# Patient Record
Sex: Female | Born: 1990 | Race: White | Hispanic: No | Marital: Single | Smoking: Never smoker
Health system: Southern US, Community
[De-identification: ages and names within clinical notes are randomized; demographics above are authoritative.]

## PROBLEM LIST (undated history)

## (undated) DIAGNOSIS — F329 Major depressive disorder, single episode, unspecified: Secondary | ICD-10-CM

## (undated) DIAGNOSIS — C7951 Secondary malignant neoplasm of bone: Secondary | ICD-10-CM

## (undated) DIAGNOSIS — Z803 Family history of malignant neoplasm of breast: Secondary | ICD-10-CM

## (undated) DIAGNOSIS — Z853 Personal history of malignant neoplasm of breast: Secondary | ICD-10-CM

## (undated) DIAGNOSIS — Z9221 Personal history of antineoplastic chemotherapy: Secondary | ICD-10-CM

## (undated) DIAGNOSIS — F32A Depression, unspecified: Secondary | ICD-10-CM

## (undated) DIAGNOSIS — Z1509 Genetic susceptibility to other malignant neoplasm: Secondary | ICD-10-CM

## (undated) DIAGNOSIS — Z95828 Presence of other vascular implants and grafts: Secondary | ICD-10-CM

## (undated) DIAGNOSIS — C50919 Malignant neoplasm of unspecified site of unspecified female breast: Secondary | ICD-10-CM

## (undated) DIAGNOSIS — Z1501 Genetic susceptibility to malignant neoplasm of breast: Secondary | ICD-10-CM

## (undated) HISTORY — DX: Malignant neoplasm of unspecified site of unspecified female breast: C50.919

## (undated) HISTORY — DX: Genetic susceptibility to malignant neoplasm of breast: Z15.01

## (undated) HISTORY — DX: Family history of malignant neoplasm of breast: Z80.3

## (undated) HISTORY — DX: Genetic susceptibility to other malignant neoplasm: Z15.09

## (undated) HISTORY — DX: Secondary malignant neoplasm of bone: C79.51

---

## 2012-12-02 DIAGNOSIS — O98119 Syphilis complicating pregnancy, unspecified trimester: Secondary | ICD-10-CM | POA: Insufficient documentation

## 2017-05-26 DIAGNOSIS — C50919 Malignant neoplasm of unspecified site of unspecified female breast: Secondary | ICD-10-CM

## 2017-05-26 HISTORY — DX: Malignant neoplasm of unspecified site of unspecified female breast: C50.919

## 2017-05-27 ENCOUNTER — Other Ambulatory Visit: Payer: Self-pay | Admitting: Physician Assistant

## 2017-05-27 DIAGNOSIS — C50911 Malignant neoplasm of unspecified site of right female breast: Secondary | ICD-10-CM

## 2017-05-28 ENCOUNTER — Other Ambulatory Visit: Payer: Self-pay | Admitting: Physician Assistant

## 2017-05-29 ENCOUNTER — Other Ambulatory Visit: Payer: Self-pay

## 2017-06-08 ENCOUNTER — Encounter: Payer: Self-pay | Admitting: Hematology & Oncology

## 2017-06-10 ENCOUNTER — Other Ambulatory Visit: Payer: Self-pay | Admitting: Family

## 2017-06-10 ENCOUNTER — Inpatient Hospital Stay: Payer: Self-pay

## 2017-06-10 ENCOUNTER — Other Ambulatory Visit: Payer: Self-pay

## 2017-06-10 ENCOUNTER — Inpatient Hospital Stay: Payer: Self-pay | Attending: Hematology & Oncology | Admitting: Hematology & Oncology

## 2017-06-10 DIAGNOSIS — Z5111 Encounter for antineoplastic chemotherapy: Secondary | ICD-10-CM | POA: Insufficient documentation

## 2017-06-10 DIAGNOSIS — C50911 Malignant neoplasm of unspecified site of right female breast: Secondary | ICD-10-CM | POA: Insufficient documentation

## 2017-06-10 DIAGNOSIS — Z7189 Other specified counseling: Secondary | ICD-10-CM

## 2017-06-10 DIAGNOSIS — Z5189 Encounter for other specified aftercare: Secondary | ICD-10-CM | POA: Insufficient documentation

## 2017-06-10 DIAGNOSIS — Z171 Estrogen receptor negative status [ER-]: Secondary | ICD-10-CM | POA: Insufficient documentation

## 2017-06-10 DIAGNOSIS — R922 Inconclusive mammogram: Secondary | ICD-10-CM | POA: Insufficient documentation

## 2017-06-10 DIAGNOSIS — Z5112 Encounter for antineoplastic immunotherapy: Secondary | ICD-10-CM | POA: Insufficient documentation

## 2017-06-10 DIAGNOSIS — C773 Secondary and unspecified malignant neoplasm of axilla and upper limb lymph nodes: Secondary | ICD-10-CM | POA: Insufficient documentation

## 2017-06-10 DIAGNOSIS — Z803 Family history of malignant neoplasm of breast: Secondary | ICD-10-CM | POA: Insufficient documentation

## 2017-06-10 LAB — CMP (CANCER CENTER ONLY)
ALBUMIN: 3.8 g/dL (ref 3.5–5.0)
ALK PHOS: 62 U/L (ref 26–84)
ALT: 14 U/L (ref 0–55)
AST: 17 U/L (ref 5–34)
Anion gap: 12 (ref 5–15)
BILIRUBIN TOTAL: 0.5 mg/dL (ref 0.2–1.2)
BUN: 15 mg/dL (ref 7–22)
CHLORIDE: 104 mmol/L (ref 98–108)
CO2: 26 mmol/L (ref 18–33)
CREATININE: 1 mg/dL (ref 0.60–1.10)
Calcium: 9.8 mg/dL (ref 8.0–10.3)
Glucose, Bld: 92 mg/dL (ref 73–118)
Potassium: 4.1 mmol/L (ref 3.5–5.1)
SODIUM: 142 mmol/L (ref 128–145)
Total Protein: 7.9 g/dL (ref 6.4–8.1)

## 2017-06-10 LAB — CBC WITH DIFFERENTIAL (CANCER CENTER ONLY)
Basophils Absolute: 0 10*3/uL (ref 0.0–0.1)
Basophils Relative: 0 %
Eosinophils Absolute: 0.4 10*3/uL (ref 0.0–0.5)
Eosinophils Relative: 4 %
HCT: 39.1 % (ref 34.8–46.6)
HEMOGLOBIN: 12.5 g/dL (ref 11.6–15.9)
LYMPHS ABS: 2.1 10*3/uL (ref 0.9–3.3)
LYMPHS PCT: 22 %
MCH: 28.9 pg (ref 26.0–34.0)
MCHC: 32 g/dL (ref 32.0–36.0)
MCV: 90.5 fL (ref 81.0–101.0)
Monocytes Absolute: 0.6 10*3/uL (ref 0.1–0.9)
Monocytes Relative: 7 %
NEUTROS PCT: 67 %
Neutro Abs: 6.2 10*3/uL (ref 1.5–6.5)
Platelet Count: 230 10*3/uL (ref 145–400)
RBC: 4.32 MIL/uL (ref 3.70–5.32)
RDW: 13.9 % (ref 11.1–15.7)
WBC: 9.2 10*3/uL (ref 3.9–10.3)

## 2017-06-10 NOTE — Progress Notes (Signed)
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 New Patient (Initial Visit)  : I found a lump in my right breast.  HPI: Sophia [name] is an incredibly nice 27 year old premenopausal white female.  She really has not been on oral contraceptives.  She has 2 children.  She started her monthly cycles at the age of 66.  She is working.  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  She lives up in [place].  I actually take care of her grandmother who has metastatic breast [name].  Her mom comes in with her.  I know her mom as well as her mom usually accompanies Sophia [name] grandmother to her appointments.  Sophia [name] noted a lump in the right breast is a while ago.  She has not sure how long ago this really was.  It was not tender.  It did not change with her monthly cycle.  There was no weight loss.  She had no cough.  She had no nausea or vomiting.  There is no change in bowel or bladder habits.  She had no arm pain or swelling.    She underwent a mammogram.  This is ordered by her family doctor, Dr. Gar Ponto.  The mammogram was high suspicious for a right breast mass measuring up to 3.7 cm.  There are smaller indeterminate masses.  She had suspicious right axillary adenopathy.  There was nothing noted in the left breast that was suspicious.  She subsequently underwent a biopsy.  She had multiple biopsies.  The biopsy was done on 05/20/2017.  The pathology report ([id number]) showed invasive ductal carcinoma involving all biopsies.  She had a biopsy of a 10:00 mass.  She had a biopsy of a 1:00 mass.  She had a biopsy of a right axillary lymph node.  Again all biopsies showed somewhat high-grade invasive ductal carcinoma.  Prognostic markers were done.  The tumor was ER negative, PR negative, and HER-2 positive.  There was a very high  proliferation marker of 85%.  She was kindly referred to the [facility] for evaluation.  She looks great.  She feels all right.  She has had no bony pain.  She has had no nausea or vomiting.  She does not smoke.  She does not drink.  There is no family history of breast [name] outside of her maternal grandmother.  She clearly will need genetic testing.  Currently, her performance status is ECOG 0.  No past medical history on file.:  :  No current outpatient medications on file.:  :  Not on File:  No family history on file.:  Social History   Socioeconomic History  . Marital status: Single    Spouse name: Not on file  . Number of children: Not on file  . Years of education: Not on file  . Highest education level: Not on file  Social Needs  . Financial resource strain: Not on file  . Food insecurity - worry: Not on file  . Food insecurity - inability: Not on file  . Transportation needs - medical: Not on file  . Transportation needs - non-medical: Not on file  Occupational History  . Not on file  Tobacco Use  . Smoking status: Not on file  Substance and Sexual Activity  . Alcohol use: Not on file  . Drug use: Not on file  .  Sexual activity: Not on file  Other Topics Concern  . Not on file  Social History Narrative  . Not on file  :  Pertinent items are noted in HPI.  Exam: Well-developed well-nourished white female in no obvious distress.  Vital signs show temperature of 98.2.  Pulse 78.  Blood pressure 116/72.  Weight is 181 pounds.  Head neck exam shows no ocular or oral lesions.  There are no palpable cervical or supraclavicular lymph nodes.  Lungs are clear bilaterally.  Cardiac exam regular rate and rhythm with no murmurs, rubs or bruits.  Breast exam shows left breast with no masses, edema or erythema.  There is no left axillary adenopathy.  Right breast is somewhat swollen.  This appears to be from her recent biopsies.  She has the  healed core biopsy scars.  She does have a palpable mass at about the 11 o'clock position.  It is non-mobile.  There is no nipple discharge.  She does have some fullness in the right axilla.  Abdomen is soft.  She has good bowel sounds.  There is no fluid wave.  There is no palpable liver or spleen tip.  Back exam shows no tenderness over the spine, ribs or hips.  Extremities shows no clubbing, cyanosis or edema.  There is no lymphedema of the right arm.  She has good range of motion of her joints.  Skin exam shows no rashes, ecchymoses or petechia. @IPVITALS@   Recent Labs    06/10/17 1534  WBC 9.2  HCT 39.1  PLT 230   Recent Labs    06/10/17 1534  NA 142  K 4.1  CL 104  CO2 26  GLUCOSE 92  BUN 15  CALCIUM 9.8    Blood smear review: None  Pathology: See above    Assessment and Plan: Sophia [name] is a very charming 26-year-old premenopausal white female.  She has locally advanced ductal carcinoma of the right breast.  It looks like she has multifocal disease.  I would say she has at least stage IIb disease.  At 26 years old, she clearly needs to have genetic testing.  I think this will dictate how she needs to have surgical therapy.  She is not sure whether not she wants to have more children.  She currently is single.  Given this, I will make sure that she will receive ovarian suppression with Lupron while she is getting treated.  She will need an MRI.  She will need this bilaterally.  Maybe, the MRI can give us more detail.  She does have dense breasts.  I also will get a PET scan on her.  We really need to see if she does have anything more than locally advanced disease.  She clearly needs systemic chemotherapy.  I would favor Taxotere/carboplatin along with Herceptin/Perjeta.  I spent a good hour with she and her mom.  They were both very nice.  I gave her information sheets about the chemotherapy protocol.  I would recommend neoadjuvant therapy for her.  This is  an incredibly complicated situation.  She is so young.  She has what appears to be aggressive disease by pathological evaluation.  She will need to have a Port-A-Cath placed.  I will see about radiology doing this for us.  I would like to try to get started in the next week or so.  I would plan on 4 cycles of neoadjuvant therapy and then we will re-evaluate with MRI.  And then plan for   surgery.  If she is BRCA positive, I probably would recommend bilateral mastectomy with oophorectomy, if she does not wish to have any further children.  She does wish to have children, oophorectomy will have to be put on hold until she gets through her child rearing years.  She has a very good understanding of the situation.  Clearly, her goal of care is to cure this so that she will be here 40 years from now and will be able to be a grandmother to her children.

## 2017-06-11 ENCOUNTER — Encounter: Payer: Self-pay | Admitting: Hematology & Oncology

## 2017-06-11 ENCOUNTER — Other Ambulatory Visit: Payer: Self-pay | Admitting: *Deleted

## 2017-06-11 DIAGNOSIS — Z171 Estrogen receptor negative status [ER-]: Secondary | ICD-10-CM | POA: Insufficient documentation

## 2017-06-11 DIAGNOSIS — C50111 Malignant neoplasm of central portion of right female breast: Secondary | ICD-10-CM | POA: Insufficient documentation

## 2017-06-11 DIAGNOSIS — Z7189 Other specified counseling: Secondary | ICD-10-CM | POA: Insufficient documentation

## 2017-06-11 LAB — LACTATE DEHYDROGENASE: LDH: 177 U/L (ref 125–245)

## 2017-06-11 LAB — CANCER ANTIGEN 27.29: CAN 27.29: 18.8 U/mL (ref 0.0–38.6)

## 2017-06-11 MED ORDER — TRASTUZUMAB CHEMO 150 MG IV SOLR: 651.0000 mg | INTRAVENOUS | 6 refills | Status: DC

## 2017-06-11 MED ORDER — PERTUZUMAB CHEMO INJECTION 420 MG/14ML: 840.0000 mg | INTRAVENOUS | 6 refills | Status: DC

## 2017-06-11 NOTE — Progress Notes (Signed)
START ON PATHWAY REGIMEN - Breast     A cycle is every 21 days:     Pertuzumab      Pertuzumab      Trastuzumab      Trastuzumab      Carboplatin      Docetaxel   **Always confirm dose/schedule in your pharmacy ordering system**    Patient Characteristics: Preoperative or Nonsurgical Candidate (Clinical Staging), Neoadjuvant Therapy followed by Surgery, Invasive Disease, Chemotherapy, HER2 Positive, ER Negative/Unknown Therapeutic Status: Preoperative or Nonsurgical Candidate (Clinical Staging) AJCC M Category: cM0 AJCC Grade: G3 Breast Surgical Plan: Neoadjuvant Therapy followed by Surgery ER Status: Negative (-) AJCC 8 Stage Grouping: IIIA HER2 Status: Positive (+) AJCC T Category: cT2 AJCC N Category: cN2 PR Status: Negative (-) Intent of Therapy: Curative Intent, Discussed with Patient

## 2017-06-12 ENCOUNTER — Other Ambulatory Visit: Payer: Self-pay | Admitting: *Deleted

## 2017-06-12 DIAGNOSIS — Z171 Estrogen receptor negative status [ER-]: Principal | ICD-10-CM

## 2017-06-12 DIAGNOSIS — C50911 Malignant neoplasm of unspecified site of right female breast: Secondary | ICD-10-CM

## 2017-06-12 MED ORDER — LIDOCAINE-PRILOCAINE 2.5-2.5 % EX CREA: TOPICAL_CREAM | CUTANEOUS | 3 refills | Status: DC

## 2017-06-12 MED ORDER — LORAZEPAM 0.5 MG PO TABS: 0.5000 mg | ORAL_TABLET | Freq: Four times a day (QID) | ORAL | 0 refills | Status: DC | PRN

## 2017-06-12 MED ORDER — DEXAMETHASONE 4 MG PO TABS: 8.0000 mg | ORAL_TABLET | Freq: Two times a day (BID) | ORAL | 1 refills | Status: DC

## 2017-06-12 MED ORDER — ONDANSETRON HCL 8 MG PO TABS: 8.0000 mg | ORAL_TABLET | Freq: Two times a day (BID) | ORAL | 1 refills | Status: DC | PRN

## 2017-06-12 MED ORDER — PEGFILGRASTIM INJECTION 6 MG/0.6ML ~~LOC~~: 6.0000 mg | PREFILLED_SYRINGE | SUBCUTANEOUS | 11 refills | Status: DC

## 2017-06-12 MED ORDER — LEUPROLIDE ACETATE 3.75 MG IM KIT: 3.7500 mg | PACK | INTRAMUSCULAR | 11 refills | Status: DC

## 2017-06-12 MED ORDER — PROCHLORPERAZINE MALEATE 10 MG PO TABS: 10.0000 mg | ORAL_TABLET | Freq: Four times a day (QID) | ORAL | 1 refills | Status: DC | PRN

## 2017-06-12 MED FILL — LORazepam 0.5 MG TABS: 0.5 | 7 days supply | Qty: 30 | Fill #0

## 2017-06-12 MED FILL — PROCHLORPERAZINE 10 MG TAB: 10 | 7 days supply | Qty: 30 | Fill #0

## 2017-06-12 MED FILL — DEXAMETHASONE 4 MG TABLET: 4 | 8 days supply | Qty: 30 | Fill #0

## 2017-06-12 MED FILL — LIDOCAINE-PRILOCAINE CREAM: 2.5-2.5 | 10 days supply | Qty: 30 | Fill #0

## 2017-06-15 ENCOUNTER — Inpatient Hospital Stay: Payer: Self-pay

## 2017-06-15 ENCOUNTER — Encounter: Payer: Self-pay | Admitting: Genetics

## 2017-06-15 ENCOUNTER — Ambulatory Visit (HOSPITAL_COMMUNITY)
Admission: RE | Admit: 2017-06-15 | Discharge: 2017-06-15 | Disposition: A | Discharge status: Home / Self Care | Payer: Self-pay | Source: Ambulatory Visit | Attending: Hematology & Oncology | Admitting: Hematology & Oncology

## 2017-06-15 ENCOUNTER — Other Ambulatory Visit: Payer: Self-pay

## 2017-06-15 DIAGNOSIS — C50911 Malignant neoplasm of unspecified site of right female breast: Secondary | ICD-10-CM | POA: Insufficient documentation

## 2017-06-15 DIAGNOSIS — Z171 Estrogen receptor negative status [ER-]: Secondary | ICD-10-CM

## 2017-06-15 DIAGNOSIS — N6311 Unspecified lump in the right breast, upper outer quadrant: Secondary | ICD-10-CM | POA: Insufficient documentation

## 2017-06-15 MED ORDER — GADOBENATE DIMEGLUMINE 529 MG/ML IV SOLN
15.0000 mL | Freq: Once | INTRAVENOUS | Status: AC | PRN
  Administered 2017-06-15: 15 mL via INTRAVENOUS

## 2017-06-15 NOTE — Progress Notes (Unsigned)
Attended class and was given information on Taxotere/Carbo/Perjeta and Herceptin.  To have treatment at [facility]

## 2017-06-17 ENCOUNTER — Inpatient Hospital Stay (HOSPITAL_BASED_OUTPATIENT_CLINIC_OR_DEPARTMENT_OTHER): Payer: Self-pay | Admitting: Genetics

## 2017-06-17 ENCOUNTER — Other Ambulatory Visit: Payer: Self-pay | Admitting: Radiology

## 2017-06-17 ENCOUNTER — Encounter: Payer: Self-pay | Admitting: Genetics

## 2017-06-17 ENCOUNTER — Inpatient Hospital Stay: Payer: Self-pay

## 2017-06-17 ENCOUNTER — Telehealth: Payer: Self-pay | Admitting: Hematology & Oncology

## 2017-06-17 ENCOUNTER — Ambulatory Visit (HOSPITAL_COMMUNITY)
Admission: RE | Admit: 2017-06-17 | Discharge: 2017-06-17 | Disposition: A | Discharge status: Home / Self Care | Payer: Self-pay | Source: Ambulatory Visit | Attending: Hematology & Oncology | Admitting: Hematology & Oncology

## 2017-06-17 DIAGNOSIS — C50911 Malignant neoplasm of unspecified site of right female breast: Secondary | ICD-10-CM | POA: Insufficient documentation

## 2017-06-17 DIAGNOSIS — Z803 Family history of malignant neoplasm of breast: Secondary | ICD-10-CM | POA: Insufficient documentation

## 2017-06-17 DIAGNOSIS — Z315 Encounter for genetic counseling: Secondary | ICD-10-CM

## 2017-06-17 DIAGNOSIS — Z171 Estrogen receptor negative status [ER-]: Secondary | ICD-10-CM

## 2017-06-17 NOTE — Progress Notes (Signed)
REFERRING PROVIDER: Ennever, Peter R, MD [address] [place], [place] [address]  PRIMARY PROVIDER:  Boyd, William S, PA  PRIMARY REASON FOR VISIT:  1. Stage II infiltrating ductal breast carcinoma, ER-, right (HCC)   2. Family history of breast [name]     HISTORY OF PRESENT ILLNESS:   Sophia [name], a 26 y.o. female, was seen for a [name] genetics consultation at the request of Dr. Ennever due to a personal and family history of [name].  Sophia [name] presents to clinic today to discuss the possibility of a hereditary predisposition to [name], genetic testing, and to further clarify her future [name] risks, as well as potential [name] risks for family members.   On 05/20/2017, at the age of 26, biopsy of the right breast revealed invasive ductal carcinoma  ER-, PR-, HER2 +.  She currently plans to undergo neoadjuvant chemotherapy and is currently considering surgical options at this time.    HORMONAL RISK FACTORS:  Menarche was at age 13.  First live birth at age 19.  Ovaries intact: yes.  Hysterectomy: no.  Menopausal status: premenopausal.  Colonoscopy: no; not examined. Mammogram within the last year: yes.  Past Medical History:  Diagnosis Date  . Brain [name] (HCC)   . Counseling regarding goals of care 06/11/2017  . Family history of breast [name]   . Stage II infiltrating ductal breast carcinoma, ER-, right (HCC) 06/11/2017    Social History   Socioeconomic History  . Marital status: Single    Spouse name: None  . Number of children: None  . Years of education: None  . Highest education level: None  Social Needs  . Financial resource strain: None  . Food insecurity - worry: None  . Food insecurity - inability: None  . Transportation needs - medical: None  . Transportation needs - non-medical: None  Occupational History  . None  Tobacco Use  . Smoking status: None  Substance and Sexual Activity  . Alcohol use: None  . Drug use: None  .  Sexual activity: None  Other Topics Concern  . None  Social History Narrative  . None     FAMILY HISTORY:  We obtained a detailed, 4-generation family history.  Significant diagnoses are listed below: Family History  Problem Relation Age of Onset  . Breast [name] Mother   . Breast [name] Maternal Grandmother 60       metastatic  . Breast [name] Other    Sophia [name] has 2 sons ages 7 and 4.  Sophia [name] has 2 maternal half sisters (28 and 26) and a maternal half-brother who is 18.    Sophia [name] does not know any information about her father and paternal family history.   Sophia [name]' mother is 50 with no history of [name].  She has her uterus and ovaries intact.  Sophia [name] has 2 maternal aunts with no history of [name] and a maternal uncle with no history of [name].  Sophia [name] has several maternal cousins who are younger than her.  They have no history of [name].  Sophia [name]' maternal grandfather died >50 with no history of [name].  Sophia [name]' maternal grandmother is currently 70 years old with metastatic breast [name] that was diagnosed in her 60's.  This grandmother has a sister who had breast [name] (patient's great aunt).  This maternal grandmother also has 2 aunts with breast [name].    Sophia [name] is unaware of previous family history of genetic testing   for hereditary [name] risks. Patient's maternal ancestors are of Caucasian/Norther European descent, and paternal ancestors are of unknown-guesses caucasian/N. European descent. There is no reported Ashkenazi Jewish ancestry. There is no known consanguinity.  GENETIC COUNSELING ASSESSMENT: Sophia [name] is a 27 y.o. 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 We, therefore, discussed and recommended the following at today's visit.   DISCUSSION: We reviewed the characteristics, features and inheritance patterns of hereditary [name] syndromes. We also discussed  genetic testing, including the appropriate family members to test, the process of testing, insurance coverage and turn-around-time for results. We discussed the implications of a negative, positive and/or variant of uncertain significant result. We recommended Sophia [name] pursue genetic testing for the Common Hereditary [name] gene panel. The Common Hereditary [name] Panel offered by [facility] includes sequencing and/or deletion duplication testing of the following 47 genes: APC, ATM, AXIN2, BARD1, BMPR1A, BRCA1, BRCA2, BRIP1, CDH1, CDKN2A (p14ARF), CDKN2A (p16INK4a), CKD4, CHEK2, CTNNA1, DICER1, EPCAM (Deletion/duplication testing only), GREM1 (promoter region deletion/duplication testing only), KIT, MEN1, MLH1, MSH2, MSH3, MSH6, MUTYH, NBN, NF1, NHTL1, PALB2, PDGFRA, PMS2, POLD1, POLE, PTEN, RAD50, RAD51C, RAD51D, SDHB, SDHC, SDHD, SMAD4, SMARCA4. STK11, TP53, TSC1, TSC2, and VHL.  The following genes were evaluated for sequence changes only: SDHA and HOXB13 c.251G>A variant only.  We discussed that only 5-10% of cancers are associated with a Hereditary [name] predisposition syndrome.  One of the most common hereditary [name] syndromes that increases breast [name] risk is called Hereditary Breast and Ovarian [name] (HBOC) syndrome.  This syndrome is caused by mutations in the BRCA1 and BRCA2 genes.  This syndrome increases an individual's lifetime risk to develop breast, ovarian, pancreatic, and other types of [name].  We also briefly discussed LiFraumeni syndrome caused by mutations in TP53.  This is a syndrome on our differential when a woman is diagnosed with breast [name] very young (in 67's). There are also many other [name] predisposition syndromes caused by mutations in several other genes.  We discussed that if she is found to have a mutation in one of these genes, it may impact surgical decisions, and alter future medical management recommendations such as increased [name] screenings and consideration  of risk reducing surgeries.  A positive result could also have implications for the patient's family members.  A Negative result would mean we were unable to identify a hereditary component to her [name], but does not rule out the possibility of a hereditary basis for her [name].  There could be mutations that are undetectable by current technology, or in genes not yet tested or identified to increase [name] risk.    We discussed the potential to find a Variant of Uncertain Significance or VUS.  These are variants that have not yet been identified as pathogenic or benign, and it is unknown if this variant is associated with increased [name] risk or if this is a normal finding.  Most VUS's are reclassified to benign or likely benign.   It should not be used to make medical management decisions. With time, we suspect the lab will determine the significance of any VUS's identified if any.   Based on Sophia [name]'s personal and family history of [name], she meets medical criteria for genetic testing. Despite that she meets criteria, she may still have an out of pocket cost. We discussed that if her out of pocket cost for testing is over $100, the laboratory will call and confirm whether she wants to proceed with testing.  If the out of pocket cost of testing is less than $100 she will be billed by the genetic testing laboratory.  She submitted paperwork to the Patient Assistant Program to waive the cost of her testing while she does not have insurance.   PLAN: After considering the risks, benefits, and limitations, Sophia [name]  provided informed consent to pursue genetic testing and the blood sample was sent to [facility] for analysis of the Common Hereditary [name] Panel. Results should be available within approximately 2-3 weeks' time, at which point they will be disclosed by telephone to Sophia [name], as will any additional recommendations warranted by these results. Sophia [name] will receive a summary  of her genetic counseling visit and a copy of her results once available. This information will also be available in Epic. We encouraged Sophia [name] to remain in contact with [name] genetics annually so that we can continuously update the family history and inform her of any changes in [name] genetics and testing that may be of benefit for her family. Sophia [name] questions were answered to her satisfaction today. Our contact information was provided should additional questions or concerns arise.  Based on Sophia [name]. Tiegs's family history, we recommended her maternal grandmother does appear to meet NCCN criteria for genetic testing.    Lastly, we encouraged Sophia [name]. Mccully to remain in contact with [name] genetics annually so that we can continuously update the family history and inform her of any changes in [name] genetics and testing that may be of benefit for this family.   Sophia [name].  Maestre questions were answered to her satisfaction today. Our contact information was provided should additional questions or concerns arise. Thank you for the referral and allowing [place] to share in the care of your patient.   Tana Felts, Sophia [name] Genetic Counselor [email or web address] phone: [phone]  The patient was seen for a total of 40 minutes in face-to-face genetic counseling.

## 2017-06-17 NOTE — Progress Notes (Signed)
*  PRELIMINARY RESULTS* Echocardiogram 2D Echocardiogram has been performed.  Sophia [name] 06/17/2017, 3:33 PM

## 2017-06-18 ENCOUNTER — Ambulatory Visit (HOSPITAL_COMMUNITY)
Admission: RE | Admit: 2017-06-18 | Discharge: 2017-06-18 | Disposition: A | Discharge status: Home / Self Care | Payer: Self-pay | Source: Ambulatory Visit | Attending: Interventional Radiology | Admitting: Interventional Radiology

## 2017-06-18 ENCOUNTER — Other Ambulatory Visit: Payer: Self-pay | Admitting: Hematology & Oncology

## 2017-06-18 ENCOUNTER — Encounter (HOSPITAL_COMMUNITY): Payer: Self-pay

## 2017-06-18 ENCOUNTER — Ambulatory Visit (HOSPITAL_COMMUNITY)
Admission: RE | Admit: 2017-06-18 | Discharge: 2017-06-18 | Disposition: A | Discharge status: Home / Self Care | Payer: Self-pay | Source: Ambulatory Visit | Attending: Hematology & Oncology | Admitting: Hematology & Oncology

## 2017-06-18 DIAGNOSIS — Z9889 Other specified postprocedural states: Secondary | ICD-10-CM | POA: Insufficient documentation

## 2017-06-18 DIAGNOSIS — Z79811 Long term (current) use of aromatase inhibitors: Secondary | ICD-10-CM | POA: Insufficient documentation

## 2017-06-18 DIAGNOSIS — Z171 Estrogen receptor negative status [ER-]: Secondary | ICD-10-CM | POA: Insufficient documentation

## 2017-06-18 DIAGNOSIS — Z85841 Personal history of malignant neoplasm of brain: Secondary | ICD-10-CM | POA: Insufficient documentation

## 2017-06-18 DIAGNOSIS — C50411 Malignant neoplasm of upper-outer quadrant of right female breast: Secondary | ICD-10-CM | POA: Insufficient documentation

## 2017-06-18 DIAGNOSIS — C50911 Malignant neoplasm of unspecified site of right female breast: Secondary | ICD-10-CM

## 2017-06-18 DIAGNOSIS — C50211 Malignant neoplasm of upper-inner quadrant of right female breast: Secondary | ICD-10-CM | POA: Insufficient documentation

## 2017-06-18 DIAGNOSIS — Z803 Family history of malignant neoplasm of breast: Secondary | ICD-10-CM | POA: Insufficient documentation

## 2017-06-18 DIAGNOSIS — C773 Secondary and unspecified malignant neoplasm of axilla and upper limb lymph nodes: Secondary | ICD-10-CM | POA: Insufficient documentation

## 2017-06-18 DIAGNOSIS — Z79818 Long term (current) use of other agents affecting estrogen receptors and estrogen levels: Secondary | ICD-10-CM | POA: Insufficient documentation

## 2017-06-18 DIAGNOSIS — Z79899 Other long term (current) drug therapy: Secondary | ICD-10-CM | POA: Insufficient documentation

## 2017-06-18 DIAGNOSIS — Z7952 Long term (current) use of systemic steroids: Secondary | ICD-10-CM | POA: Insufficient documentation

## 2017-06-18 HISTORY — PX: IR FLUORO GUIDE PORT INSERTION RIGHT: IMG5741

## 2017-06-18 HISTORY — PX: IR US GUIDE VASC ACCESS RIGHT: IMG2390

## 2017-06-18 LAB — CBC WITH DIFFERENTIAL/PLATELET
Basophils Absolute: 0 10*3/uL (ref 0.0–0.1)
Basophils Relative: 0 %
EOS ABS: 0.2 10*3/uL (ref 0.0–0.7)
Eosinophils Relative: 3 %
HEMATOCRIT: 40.1 % (ref 36.0–46.0)
HEMOGLOBIN: 12.8 g/dL (ref 12.0–15.0)
LYMPHS ABS: 2.1 10*3/uL (ref 0.7–4.0)
Lymphocytes Relative: 25 %
MCH: 28.3 pg (ref 26.0–34.0)
MCHC: 31.9 g/dL (ref 30.0–36.0)
MCV: 88.7 fL (ref 78.0–100.0)
MONO ABS: 0.5 10*3/uL (ref 0.1–1.0)
MONOS PCT: 6 %
NEUTROS PCT: 66 %
Neutro Abs: 5.5 10*3/uL (ref 1.7–7.7)
Platelets: 225 10*3/uL (ref 150–400)
RBC: 4.52 MIL/uL (ref 3.87–5.11)
RDW: 14.1 % (ref 11.5–15.5)
WBC: 8.2 10*3/uL (ref 4.0–10.5)

## 2017-06-18 LAB — PROTIME-INR
INR: 1
PROTHROMBIN TIME: 13.1 s (ref 11.4–15.2)

## 2017-06-18 MED ORDER — CEFAZOLIN SODIUM-DEXTROSE 2-4 GM/100ML-% IV SOLN
2.0000 g | INTRAVENOUS | Status: AC
  Administered 2017-06-18: 2 g via INTRAVENOUS

## 2017-06-18 MED ORDER — LIDOCAINE-EPINEPHRINE (PF) 2 %-1:200000 IJ SOLN
INTRAMUSCULAR | Status: AC
  Filled 2017-06-18: qty 20

## 2017-06-18 MED ORDER — MIDAZOLAM HCL 2 MG/2ML IJ SOLN
INTRAMUSCULAR | Status: AC
  Filled 2017-06-18: qty 4

## 2017-06-18 MED ORDER — HEPARIN SOD (PORK) LOCK FLUSH 100 UNIT/ML IV SOLN
INTRAVENOUS | Status: AC | PRN
  Administered 2017-06-18: 500 [IU] via INTRAVENOUS

## 2017-06-18 MED ORDER — LIDOCAINE HCL 1 % IJ SOLN
INTRAMUSCULAR | Status: AC | PRN
  Administered 2017-06-18: 20 mL

## 2017-06-18 MED ORDER — SODIUM CHLORIDE 0.9 % IV SOLN
INTRAVENOUS | Status: DC
  Administered 2017-06-18: 10:00:00 via INTRAVENOUS

## 2017-06-18 MED ORDER — CEFAZOLIN SODIUM-DEXTROSE 2-4 GM/100ML-% IV SOLN
INTRAVENOUS | Status: AC
  Administered 2017-06-18: 2 g via INTRAVENOUS
  Filled 2017-06-18: qty 100

## 2017-06-18 MED ORDER — FENTANYL CITRATE (PF) 100 MCG/2ML IJ SOLN
INTRAMUSCULAR | Status: AC
  Filled 2017-06-18: qty 2

## 2017-06-18 MED ORDER — HEPARIN SOD (PORK) LOCK FLUSH 100 UNIT/ML IV SOLN
INTRAVENOUS | Status: AC
  Filled 2017-06-18: qty 5

## 2017-06-18 MED ORDER — FENTANYL CITRATE (PF) 100 MCG/2ML IJ SOLN
INTRAMUSCULAR | Status: AC | PRN
  Administered 2017-06-18 (×2): 50 ug via INTRAVENOUS

## 2017-06-18 MED ORDER — MIDAZOLAM HCL 2 MG/2ML IJ SOLN
INTRAMUSCULAR | Status: AC | PRN
  Administered 2017-06-18 (×3): 1 mg via INTRAVENOUS

## 2017-06-18 NOTE — Consult Note (Signed)
Chief Complaint: "I'm here for a port a cath"  Referring Physician(s): Ennever,Peter R  Supervising Physician: Arne Cleveland  Patient Status: Sophia [name]  History of Present Illness: Sophia [name] is a 27 y.o. female with recently diagnosed right breast cancer who presents today for Port-A-Cath placement for chemotherapy.  Past Medical History:  Diagnosis Date  . Brain cancer ([place])   . Counseling regarding goals of care 06/11/2017  . Family history of breast cancer   . Stage II infiltrating ductal breast carcinoma, ER-, right ([place]) 06/11/2017    History reviewed. No pertinent surgical history.  Allergies: Patient has no allergy information on record.  Medications: Prior to Admission medications   Medication Sig Start Date End Date Taking? Authorizing Provider  acetaminophen (TYLENOL) 500 MG tablet Take 500 mg by mouth every 6 (six) hours as needed.   Yes [provider]  dexamethasone (DECADRON) 4 MG tablet Take 2 tablets (8 mg total) by mouth 2 (two) times daily. Start the day before Taxotere. Then again the day after chemo for 3 days. 06/12/17   Volanda Napoleon, MD  leuprolide (LUPRON DEPOT, 34-MONTH,) 3.75 MG injection Inject 3.75 mg into the muscle every 28 (twenty-eight) days. 06/12/17   Volanda Napoleon, MD  lidocaine-prilocaine (EMLA) cream Apply to affected area once 06/12/17   Ennever, Rudell Cobb, MD  LORazepam (ATIVAN) 0.5 MG tablet Take 1 tablet (0.5 mg total) by mouth every 6 (six) hours as needed (Nausea or vomiting). 06/12/17   Volanda Napoleon, MD  ondansetron (ZOFRAN) 8 MG tablet Take 1 tablet (8 mg total) by mouth 2 (two) times daily as needed for refractory nausea / vomiting. Start on day 3 after chemo. 06/12/17   Volanda Napoleon, MD  pegfilgrastim (NEULASTA) 6 MG/0.6ML injection Inject 0.6 mLs (6 mg total) into the skin every 21 ( twenty-one) days. 06/12/17   Volanda Napoleon, MD  pertuzumab (PERJETA) 420 MG/14ML SOLN Inject 28 mLs (840 mg total) into  the vein every 21 ( twenty-one) days. 06/11/17   Volanda Napoleon, MD  prochlorperazine (COMPAZINE) 10 MG tablet Take 1 tablet (10 mg total) by mouth every 6 (six) hours as needed (Nausea or vomiting). 06/12/17   Volanda Napoleon, MD  trastuzumab (HERCEPTIN) 150 MG SOLR injection Inject 31 mLs (651 mg total) into the vein every 21 ( twenty-one) days. 8mg /kg 06/11/17   Volanda Napoleon, MD     Family History  Problem Relation Age of Onset  . Breast cancer Mother   . Breast cancer Maternal Grandmother 60       metastatic  . Breast cancer Other     Social History   Socioeconomic History  . Marital status: Single    Spouse name: None  . Number of children: None  . Years of education: None  . Highest education level: None  Social Needs  . Financial resource strain: None  . Food insecurity - worry: None  . Food insecurity - inability: None  . Transportation needs - medical: None  . Transportation needs - non-medical: None  Occupational History  . None  Tobacco Use  . Smoking status: Never Smoker  . Smokeless tobacco: Never Used  Substance and Sexual Activity  . Alcohol use: None  . Drug use: None  . Sexual activity: None  Other Topics Concern  . None  Social History Narrative  . None      Review of Systems currently denies fever, headache, chest pain, dyspnea, cough, abdominal/back pain, nausea,  vomiting or bleeding.  Vital Signs: BP 108/72 (BP Location: Right Arm)   Pulse 86   Temp 98 F (36.7 C) (Oral)   Resp 18   SpO2 99%   Physical Exam awake, alert.  Chest clear to auscultation bilaterally. Fullness rt axilla.  Heart with regular rate and rhythm.  Abdomen soft, positive bowel sounds, nontender.  No lower extremity edema.  Imaging: Mr Breast Bilateral W Wo Contrast Inc Cad  Result Date: 06/15/2017 CLINICAL DATA:  Recent biopsy-proven invasive ductal carcinoma at the 10 o'clock position of the right breast as well as 1:00 position of the right breast with biopsy  proven right axillary nodal metastatic disease. Assess for extent of disease. LABS:  None. EXAM: BILATERAL BREAST MRI WITH AND WITHOUT CONTRAST TECHNIQUE: Multiplanar, multisequence MR images of both breasts were obtained prior to and following the intravenous administration of 15 ml of MultiHance. THREE-DIMENSIONAL MR IMAGE RENDERING ON INDEPENDENT WORKSTATION: Three-dimensional MR images were rendered by post-processing of the original MR data on an independent workstation. The three-dimensional MR images were interpreted, and findings are reported in the following complete MRI report for this study. Three dimensional images were evaluated at the independent DynaCad workstation COMPARISON:  Previous exam(s). FINDINGS: Breast composition: c. Heterogeneous fibroglandular tissue. Background parenchymal enhancement: Mild. Right breast: Examination demonstrates a large irregular mass with somewhat heterogeneous enhancement and irregular borders occupying most of the central breast encompassing all 4 quadrants and measuring approximately 3.3 x 8.0 x 8.0 cm in AP, transverse and craniocaudal dimensions. Biopsy clip artifact is present over the upper outer quadrant and upper inner quadrant sections of this mass. This mass and enhancement extends to the nipple base. No evidence of skin involvement and no evidence of pectoralis invasion. The 2 masses seen on patient's diagnostic ultrasound over the 12 o'clock and 8:00 position of the right breast are likely part of this large mass. Left breast: No mass or abnormal enhancement. Lymph nodes: Extensive bulky right axillary adenopathy compatible with biopsy-proven metastatic disease. Clip artifact noted over the lower bulky nodes. Several small right internal mammary chain lymph nodes with the largest likely metastatic measuring 8 mm in greatest diameter. Ancillary findings:  None. IMPRESSION: Large irregular heterogeneously enhancing mass occupying most of the central right  breast measuring 3.3 x 8.0 x 8.0 cm extending into all 4 quadrants and the nipple base compatible with biopsy-proven invasive ductal carcinoma. Post biopsy clip artifact over the upper inner quadrant and upper outer quadrants sections of this large mass. Extensive bulky right axillary adenopathy compatible with biopsy-proven metastatic disease. Several small right internal mammary chain lymph nodes with the largest measuring 8 mm likely metastatic. No suspicious findings within the left breast or axilla. RECOMMENDATION: Recommend continued management per clinical treatment plan. BI-RADS CATEGORY  6: Known biopsy-proven malignancy. Electronically Signed   By: Marin Olp M.D.   On: 06/15/2017 17:10    Labs:  CBC: Recent Labs    06/10/17 1534 06/18/17 0939  WBC 9.2 8.2  HGB  --  12.8  HCT 39.1 40.1  PLT 230 225    COAGS: No results for input(s): INR, APTT in the last 8760 hours.  BMP: Recent Labs    06/10/17 1534  NA 142  K 4.1  CL 104  CO2 26  GLUCOSE 92  BUN 15  CALCIUM 9.8    LIVER FUNCTION TESTS: Recent Labs    06/10/17 1534  BILITOT 0.5  AST 17  ALT 14  ALKPHOS 62  PROT 7.9  ALBUMIN  3.8    TUMOR MARKERS: No results for input(s): AFPTM, CEA, CA199, CHROMGRNA in the last 8760 hours.  Assessment and Plan: 27 y.o. female with recently diagnosed right breast cancer (with right axillary lymph node involvement )who presents today for Port-A-Cath placement for chemotherapy.Risks and benefits discussed with the patient/mother including, but not limited to bleeding, infection, pneumothorax, or fibrin sheath development and need for additional procedures.All of the patient's questions were answered, patient is agreeable to proceed.Consent signed and in chart.     Thank you for this interesting consult.  I greatly enjoyed meeting Shakirra Buehler and look forward to participating in their care.  A copy of this report was sent to the requesting provider on this  date.  Electronically Signed: D. Rowe Robert, PA-C 06/18/2017, 10:05 AM   I spent a total of 20 minutes  in face to face in clinical consultation, greater than 50% of which was counseling/coordinating care for Port-A-Cath placement

## 2017-06-18 NOTE — Procedures (Signed)
  Procedure: R IJ PowerPort placement EBL:   minimal Complications:  none immediate  See full dictation in [facility] PACS.  D. Jc Veron MD Main # [phone] Pager  [phone]     

## 2017-06-18 NOTE — Discharge Instructions (Signed)
Moderate Conscious Sedation, Adult, Care After °These instructions provide you with information about caring for yourself after your procedure. Your health care provider may also give you more specific instructions. Your treatment has been planned according to current medical practices, but problems sometimes occur. Call your health care provider if you have any problems or questions after your procedure. °What can I expect after the procedure? °After your procedure, it is common: °· To feel sleepy for several hours. °· To feel clumsy and have poor balance for several hours. °· To have poor judgment for several hours. °· To vomit if you eat too soon. ° °Follow these instructions at home: °For at least 24 hours after the procedure: ° °· Do not: °? Participate in activities where you could fall or become injured. °? Drive. °? Use heavy machinery. °? Drink alcohol. °? Take sleeping pills or medicines that cause drowsiness. °? Make important decisions or sign legal documents. °? Take care of children on your own. °· Rest. °Eating and drinking °· Follow the diet recommended by your health care provider. °· If you vomit: °? Drink water, juice, or soup when you can drink without vomiting. °? Make sure you have little or no nausea before eating solid foods. °General instructions °· Have a responsible adult stay with you until you are awake and alert. °· Take over-the-counter and prescription medicines only as told by your health care provider. °· If you smoke, do not smoke without supervision. °· Keep all follow-up visits as told by your health care provider. This is important. °Contact a health care provider if: °· You keep feeling nauseous or you keep vomiting. °· You feel light-headed. °· You develop a rash. °· You have a fever. °Get help right away if: °· You have trouble breathing. °This information is not intended to replace advice given to you by your health care provider. Make sure you discuss any questions you have  with your health care provider. °Document Released: 03/02/2013 Document Revised: 10/15/2015 Document Reviewed: 09/01/2015 °Elsevier Interactive Patient Education © 2018 [employer] ° ° °Implanted Port Home Guide °An implanted port is a type of central line that is placed under the skin. Central lines are used to provide IV access when treatment or nutrition needs to be given through a person’s veins. Implanted ports are used for long-term IV access. An implanted port may be placed because: °· You need IV medicine that would be irritating to the small veins in your hands or arms. °· You need long-term IV medicines, such as antibiotics. °· You need IV nutrition for a long period. °· You need frequent blood draws for lab tests. °· You need dialysis. ° °Implanted ports are usually placed in the chest area, but they can also be placed in the upper arm, the abdomen, or the leg. An implanted port has two main parts: °· Reservoir. The reservoir is round and will appear as a small, raised area under your skin. The reservoir is the part where a needle is inserted to give medicines or draw blood. °· Catheter. The catheter is a thin, flexible tube that extends from the reservoir. The catheter is placed into a large vein. Medicine that is inserted into the reservoir goes into the catheter and then into the vein. ° °How will I care for my incision site? °Do not get the incision site wet. Bathe or shower as directed by your health care provider. °How is my port accessed? °Special steps must be taken to access the   port: °· Before the port is accessed, a numbing cream can be placed on the skin. This helps numb the skin over the port site. °· Your health care provider uses a sterile technique to access the port. °? Your health care provider must put on a mask and sterile gloves. °? The skin over your port is cleaned carefully with an antiseptic and allowed to dry. °? The port is gently pinched between sterile gloves, and a needle  is inserted into the port. °· Only "non-coring" port needles should be used to access the port. Once the port is accessed, a blood return should be checked. This helps ensure that the port is in the vein and is not clogged. °· If your port needs to remain accessed for a constant infusion, a clear (transparent) bandage will be placed over the needle site. The bandage and needle will need to be changed every week, or as directed by your health care provider. °· Keep the bandage covering the needle clean and dry. Do not get it wet. Follow your health care provider’s instructions on how to take a shower or bath while the port is accessed. °· If your port does not need to stay accessed, no bandage is needed over the port. ° °What is flushing? °Flushing helps keep the port from getting clogged. Follow your health care provider’s instructions on how and when to flush the port. Ports are usually flushed with saline solution or a medicine called heparin. The need for flushing will depend on how the port is used. °· If the port is used for intermittent medicines or blood draws, the port will need to be flushed: °? After medicines have been given. °? After blood has been drawn. °? As part of routine maintenance. °· If a constant infusion is running, the port may not need to be flushed. ° °How long will my port stay implanted? °The port can stay in for as long as your health care provider thinks it is needed. When it is time for the port to come out, surgery will be done to remove it. The procedure is similar to the one performed when the port was put in. °When should I seek immediate medical care? °When you have an implanted port, you should seek immediate medical care if: °· You notice a bad smell coming from the incision site. °· You have swelling, redness, or drainage at the incision site. °· You have more swelling or pain at the port site or the surrounding area. °· You have a fever that is not controlled with  medicine. ° °This information is not intended to replace advice given to you by your health care provider. Make sure you discuss any questions you have with your health care provider. °Document Released: 05/12/2005 Document Revised: 10/18/2015 Document Reviewed: 01/17/2013 °Elsevier Interactive Patient Education © 2017 [employer] ° ° °Implanted Port Insertion, Care After °This sheet gives you information about how to care for yourself after your procedure. Your health care provider may also give you more specific instructions. If you have problems or questions, contact your health care provider. °What can I expect after the procedure? °After your procedure, it is common to have: °· Discomfort at the port insertion site. °· Bruising on the skin over the port. This should improve over 3-4 days. ° °Follow these instructions at home: °Port care °· After your port is placed, you will get a manufacturer's information card. The card has information about your port. Keep this card with   you at all times.  Take care of the port as told by your health care provider. Ask your health care provider if you or a family member can get training for taking care of the port at home. A home health care nurse may also take care of the port.  Make sure to remember what type of port you have. Incision care  Follow instructions from your health care provider about how to take care of your port insertion site. Make sure you: ? Wash your hands with soap and water before you change your bandage (dressing). If soap and water are not available, use hand sanitizer. ? Change your dressing as told by your health care provider.  You may remove your dressing tomorrow. ? Leave skin glue in place. These skin closures may need to stay in place for 2 weeks or longer. If adhesive strip edges start to loosen and curl up, you may trim the loose edges. Do not remove adhesive strips completely unless your health care provider tells you to do  that.  DO NOT use EMLA cream for 2 weeks after port placement as this will remove surgical glue on you incision.  Check your port insertion site every day for signs of infection. Check for: ? More redness, swelling, or pain. ? More fluid or blood. ? Warmth. ? Pus or a bad smell. General instructions  Do not take baths, swim, or use a hot tub until your health care provider approves.  You may shower tomorrow.  Do not lift anything that is heavier than 10 lb (4.5 kg) for a week, or as told by your health care provider.  Ask your health care provider when it is okay to: ? Return to work or school. ? Resume usual physical activities or sports.  Do not drive for 24 hours if you were given a medicine to help you relax (sedative).  Take over-the-counter and prescription medicines only as told by your health care provider.  Wear a medical alert bracelet in case of an emergency. This will tell any health care providers that you have a port.  Keep all follow-up visits as told by your health care provider. This is important. Contact a health care provider if:  You have a fever or chills.  You have more redness, swelling, or pain around your port insertion site.  You have more fluid or blood coming from your port insertion site.  Your port insertion site feels warm to the touch.  You have pus or a bad smell coming from the port insertion site. Get help right away if:  You have chest pain or shortness of breath.  You have bleeding from your port that you cannot control. Summary  Take care of the port as told by your health care provider.  Change your dressing as told by your health care provider.  Keep all follow-up visits as told by your health care provider. This information is not intended to replace advice given to you by your health care provider. Make sure you discuss any questions you have with your health care provider. Document Released: 03/02/2013 Document Revised:  04/02/2016 Document Reviewed: 04/02/2016 Elsevier Interactive Patient Education  2017 [employer].

## 2017-06-19 ENCOUNTER — Inpatient Hospital Stay: Payer: Self-pay

## 2017-06-19 ENCOUNTER — Encounter: Payer: Self-pay | Admitting: Hematology & Oncology

## 2017-06-19 VITALS — BP 112/61 | HR 88 | Temp 98.0°F | Resp 16

## 2017-06-19 DIAGNOSIS — Z803 Family history of malignant neoplasm of breast: Secondary | ICD-10-CM

## 2017-06-19 DIAGNOSIS — C50911 Malignant neoplasm of unspecified site of right female breast: Secondary | ICD-10-CM

## 2017-06-19 DIAGNOSIS — Z171 Estrogen receptor negative status [ER-]: Principal | ICD-10-CM

## 2017-06-19 DIAGNOSIS — Z7189 Other specified counseling: Secondary | ICD-10-CM

## 2017-06-19 LAB — CMP (CANCER CENTER ONLY)
ALT: 12 U/L (ref 0–55)
AST: 18 U/L (ref 5–34)
Albumin: 3.4 g/dL — ABNORMAL LOW (ref 3.5–5.0)
Alkaline Phosphatase: 58 U/L (ref 26–84)
Anion gap: 6 (ref 5–15)
BILIRUBIN TOTAL: 0.6 mg/dL (ref 0.2–1.2)
BUN: 12 mg/dL (ref 7–22)
CALCIUM: 9.6 mg/dL (ref 8.0–10.3)
CHLORIDE: 109 mmol/L — AB (ref 98–108)
CO2: 26 mmol/L (ref 18–33)
CREATININE: 0.5 mg/dL — AB (ref 0.60–1.10)
GLUCOSE: 102 mg/dL (ref 73–118)
Potassium: 3.6 mmol/L (ref 3.5–5.1)
Sodium: 141 mmol/L (ref 128–145)
Total Protein: 7.9 g/dL (ref 6.4–8.1)

## 2017-06-19 LAB — CBC WITH DIFFERENTIAL (CANCER CENTER ONLY)
BASOS ABS: 0 10*3/uL (ref 0.0–0.1)
Basophils Relative: 0 %
EOS ABS: 0.2 10*3/uL (ref 0.0–0.5)
EOS PCT: 4 %
HEMATOCRIT: 37.9 % (ref 34.8–46.6)
Hemoglobin: 12.1 g/dL (ref 11.6–15.9)
Lymphocytes Relative: 20 %
Lymphs Abs: 1.3 10*3/uL (ref 0.9–3.3)
MCH: 28.8 pg (ref 26.0–34.0)
MCHC: 31.9 g/dL — AB (ref 32.0–36.0)
MCV: 90.2 fL (ref 81.0–101.0)
MONO ABS: 0.5 10*3/uL (ref 0.1–0.9)
MONOS PCT: 7 %
Neutro Abs: 4.5 10*3/uL (ref 1.5–6.5)
Neutrophils Relative %: 69 %
PLATELETS: 194 10*3/uL (ref 145–400)
RBC: 4.2 MIL/uL (ref 3.70–5.32)
RDW: 13.9 % (ref 11.1–15.7)
WBC Count: 6.5 10*3/uL (ref 3.9–10.3)

## 2017-06-19 MED ORDER — DIPHENHYDRAMINE HCL 25 MG PO CAPS
50.0000 mg | ORAL_CAPSULE | Freq: Once | ORAL | Status: AC
  Administered 2017-06-19: 50 mg via ORAL

## 2017-06-19 MED ORDER — SODIUM CHLORIDE 0.9 % IV SOLN
Freq: Once | INTRAVENOUS | Status: AC
  Administered 2017-06-19: 10:00:00 via INTRAVENOUS

## 2017-06-19 MED ORDER — SODIUM CHLORIDE 0.9 % IV SOLN
840.0000 mg | Freq: Once | INTRAVENOUS | Status: AC
  Administered 2017-06-19: 840 mg via INTRAVENOUS
  Filled 2017-06-19: qty 28

## 2017-06-19 MED ORDER — SODIUM CHLORIDE 0.9 % IV SOLN
750.0000 mg | Freq: Once | INTRAVENOUS | Status: AC
  Administered 2017-06-19: 750 mg via INTRAVENOUS
  Filled 2017-06-19: qty 75

## 2017-06-19 MED ORDER — PALONOSETRON HCL INJECTION 0.25 MG/5ML
0.2500 mg | Freq: Once | INTRAVENOUS | Status: AC
  Administered 2017-06-19: 0.25 mg via INTRAVENOUS

## 2017-06-19 MED ORDER — LEUPROLIDE ACETATE 3.75 MG IM KIT
3.7500 mg | PACK | Freq: Once | INTRAMUSCULAR | Status: AC
  Administered 2017-06-19: 3.75 mg via INTRAMUSCULAR
  Filled 2017-06-19: qty 3.75

## 2017-06-19 MED ORDER — ACETAMINOPHEN 325 MG PO TABS
650.0000 mg | ORAL_TABLET | Freq: Once | ORAL | Status: AC
  Administered 2017-06-19: 650 mg via ORAL

## 2017-06-19 MED ORDER — PEGFILGRASTIM 6 MG/0.6ML ~~LOC~~ PSKT
6.0000 mg | PREFILLED_SYRINGE | Freq: Once | SUBCUTANEOUS | Status: AC
  Administered 2017-06-19: 6 mg via SUBCUTANEOUS

## 2017-06-19 MED ORDER — HEPARIN SOD (PORK) LOCK FLUSH 100 UNIT/ML IV SOLN
500.0000 [IU] | Freq: Once | INTRAVENOUS | Status: AC | PRN
  Administered 2017-06-19: 500 [IU]
  Filled 2017-06-19: qty 5

## 2017-06-19 MED ORDER — DEXAMETHASONE SODIUM PHOSPHATE 10 MG/ML IJ SOLN
10.0000 mg | Freq: Once | INTRAMUSCULAR | Status: AC
  Administered 2017-06-19: 10 mg via INTRAVENOUS

## 2017-06-19 MED ORDER — TRASTUZUMAB CHEMO 150 MG IV SOLR
650.0000 mg | Freq: Once | INTRAVENOUS | Status: AC
  Administered 2017-06-19: 650 mg via INTRAVENOUS
  Filled 2017-06-19: qty 30.95

## 2017-06-19 MED ORDER — SODIUM CHLORIDE 0.9% FLUSH
10.0000 mL | INTRAVENOUS | Status: DC | PRN
  Administered 2017-06-19: 10 mL
  Filled 2017-06-19: qty 10

## 2017-06-19 MED ORDER — DEXTROSE 5 % IV SOLN
75.0000 mg/m2 | Freq: Once | INTRAVENOUS | Status: AC
  Administered 2017-06-19: 150 mg via INTRAVENOUS
  Filled 2017-06-19: qty 15

## 2017-06-19 NOTE — Patient Instructions (Signed)
Dexamethasone injection What is this medicine? DEXAMETHASONE (dex a METH a sone) is a corticosteroid. It is used to treat inflammation of the skin, joints, lungs, and other organs. Common conditions treated include asthma, allergies, and arthritis. It is also used for other conditions, like blood disorders and diseases of the adrenal glands. This medicine may be used for other purposes; ask your health care provider or pharmacist if you have questions. COMMON BRAND NAME(S): Decadron, DoubleDex, Simplist Dexamethasone, Solurex What should I tell my health care provider before I take this medicine? They need to know if you have any of these conditions: -blood clotting problems -Cushing's syndrome -diabetes -glaucoma -heart problems or disease -high blood pressure -infection like herpes, measles, tuberculosis, or chickenpox -kidney disease -liver disease -mental problems -myasthenia gravis -osteoporosis -previous heart attack -seizures -stomach, ulcer or intestine disease including colitis and diverticulitis -thyroid problem -an unusual or allergic reaction to dexamethasone, corticosteroids, other medicines, lactose, foods, dyes, or preservatives -pregnant or trying to get pregnant -breast-feeding How should I use this medicine? This medicine is for injection into a muscle, joint, lesion, soft tissue, or vein. It is given by a health care professional in a hospital or clinic setting. Talk to your pediatrician regarding the use of this medicine in children. Special care may be needed. Overdosage: If you think you have taken too much of this medicine contact a poison control center or emergency room at once. NOTE: This medicine is only for you. Do not share this medicine with others. What if I miss a dose? This may not apply. If you are having a series of injections over a prolonged period, try not to miss an appointment. Call your doctor or health care professional to reschedule if you  are unable to keep an appointment. What may interact with this medicine? Do not take this medicine with any of the following medications: -mifepristone, RU-486 -vaccines This medicine may also interact with the following medications: -amphotericin B -antibiotics like clarithromycin, erythromycin, and troleandomycin -aspirin and aspirin-like drugs -barbiturates like phenobarbital -carbamazepine -cholestyramine -cholinesterase inhibitors like donepezil, galantamine, rivastigmine, and tacrine -cyclosporine -digoxin -diuretics -ephedrine -female hormones, like estrogens or progestins and birth control pills -indinavir -isoniazid -ketoconazole -medicines for diabetes -medicines that improve muscle tone or strength for conditions like myasthenia gravis -NSAIDs, medicines for pain and inflammation, like ibuprofen or naproxen -phenytoin -rifampin -thalidomide -warfarin This list may not describe all possible interactions. Give your health care provider a list of all the medicines, herbs, non-prescription drugs, or dietary supplements you use. Also tell them if you smoke, drink alcohol, or use illegal drugs. Some items may interact with your medicine. What should I watch for while using this medicine? Your condition will be monitored carefully while you are receiving this medicine. If you are taking this medicine for a long time, carry an identification card with your name and address, the type and dose of your medicine, and your doctor's name and address. This medicine may increase your risk of getting an infection. Stay away from people who are sick. Tell your doctor or health care professional if you are around anyone with measles or chickenpox. Talk to your health care provider before you get any vaccines that you take this medicine. If you are going to have surgery, tell your doctor or health care professional that you have taken this medicine within the last twelve months. Ask your  doctor or health care professional about your diet. You may need to lower the amount of salt you eat. The  medicine can increase your blood sugar. If you are a diabetic check with your doctor if you need help adjusting the dose of your diabetic medicine. What side effects may I notice from receiving this medicine? Side effects that you should report to your doctor or health care professional as soon as possible: -allergic reactions like skin rash, itching or hives, swelling of the face, lips, or tongue -black or tarry stools -change in the amount of urine -changes in vision -confusion, excitement, restlessness, a false sense of well-being -fever, sore throat, sneezing, cough, or other signs of infection, wounds that will not heal -hallucinations -increased thirst -mental depression, mood swings, mistaken feelings of self importance or of being mistreated -pain in hips, back, ribs, arms, shoulders, or legs -pain, redness, or irritation at the injection site -redness, blistering, peeling or loosening of the skin, including inside the mouth -rounding out of face -swelling of feet or lower legs -unusual bleeding or bruising -unusual tired or weak -wounds that do not heal Side effects that usually do not require medical attention (report to your doctor or health care professional if they continue or are bothersome): -diarrhea or constipation -change in taste -headache -nausea, vomiting -skin problems, acne, thin and shiny skin -touble sleeping -unusual growth of hair on the face or body -weight gain This list may not describe all possible side effects. Call your doctor for medical advice about side effects. You may report side effects to FDA at 1-800-FDA-1088. Where should I keep my medicine? This drug is given in a hospital or clinic and will not be stored at home. NOTE: This sheet is a summary. It may not cover all possible information. If you have questions about this medicine, talk to  your doctor, pharmacist, or health care provider.  2018 [employer] Standard (2007-09-02 14:04:12) Palonosetron Injection What is this medicine? PALONOSETRON (pal oh NOE se tron) is used to prevent nausea and vomiting caused by chemotherapy. It also helps prevent delayed nausea and vomiting that may occur a few days after your treatment. This medicine may be used for other purposes; ask your health care provider or pharmacist if you have questions. COMMON BRAND NAME(S): Aloxi What should I tell my health care provider before I take this medicine? They need to know if you have any of these conditions: -an unusual or allergic reaction to palonosetron, dolasetron, granisetron, ondansetron, other medicines, foods, dyes, or preservatives -pregnant or trying to get pregnant -breast-feeding How should I use this medicine? This medicine is for infusion into a vein. It is given by a health care professional in a hospital or clinic setting. Talk to your pediatrician regarding the use of this medicine in children. While this drug may be prescribed for children as young as 1 month for selected conditions, precautions do apply. Overdosage: If you think you have taken too much of this medicine contact a poison control center or emergency room at once. NOTE: This medicine is only for you. Do not share this medicine with others. What if I miss a dose? This does not apply. What may interact with this medicine? -certain medicines for depression, anxiety, or psychotic disturbances -fentanyl -linezolid -MAOIs like Carbex, Eldepryl, Marplan, Nardil, and Parnate -methylene blue (injected into a vein) -tramadol This list may not describe all possible interactions. Give your health care provider a list of all the medicines, herbs, non-prescription drugs, or dietary supplements you use. Also tell them if you smoke, drink alcohol, or use illegal drugs. Some items may interact with your medicine.  What should I  watch for while using this medicine? Your condition will be monitored carefully while you are receiving this medicine. What side effects may I notice from receiving this medicine? Side effects that you should report to your doctor or health care professional as soon as possible: -allergic reactions like skin rash, itching or hives, swelling of the face, lips, or tongue -breathing problems -confusion -dizziness -fast, irregular heartbeat -fever and chills -loss of balance or coordination -seizures -sweating -swelling of the hands and feet -tremors -unusually weak or tired Side effects that usually do not require medical attention (report to your doctor or health care professional if they continue or are bothersome): -constipation or diarrhea -headache This list may not describe all possible side effects. Call your doctor for medical advice about side effects. You may report side effects to FDA at 1-800-FDA-1088. Where should I keep my medicine? This drug is given in a hospital or clinic and will not be stored at home. NOTE: This sheet is a summary. It may not cover all possible information. If you have questions about this medicine, talk to your doctor, pharmacist, or health care provider.  2018 Else[employer]/Gold Standard (2013-03-18 10:38:36) Carboplatin injection What is this medicine? CARBOPLATIN (KAR boe pla tin) is a chemotherapy drug. It targets fast dividing cells, like cancer cells, and causes these cells to die. This medicine is used to treat ovarian cancer and many other cancers. This medicine may be used for other purposes; ask your health care provider or pharmacist if you have questions. COMMON BRAND NAME(S): Paraplatin What should I tell my health care provider before I take this medicine? They need to know if you have any of these conditions: -blood disorders -hearing problems -kidney disease -recent or ongoing radiation therapy -an unusual or allergic reaction to  carboplatin, cisplatin, other chemotherapy, other medicines, foods, dyes, or preservatives -pregnant or trying to get pregnant -breast-feeding How should I use this medicine? This drug is usually given as an infusion into a vein. It is administered in a hospital or clinic by a specially trained health care professional. Talk to your pediatrician regarding the use of this medicine in children. Special care may be needed. Overdosage: If you think you have taken too much of this medicine contact a poison control center or emergency room at once. NOTE: This medicine is only for you. Do not share this medicine with others. What if I miss a dose? It is important not to miss a dose. Call your doctor or health care professional if you are unable to keep an appointment. What may interact with this medicine? -medicines for seizures -medicines to increase blood counts like filgrastim, pegfilgrastim, sargramostim -some antibiotics like amikacin, gentamicin, neomycin, streptomycin, tobramycin -vaccines Talk to your doctor or health care professional before taking any of these medicines: -acetaminophen -aspirin -ibuprofen -ketoprofen -naproxen This list may not describe all possible interactions. Give your health care provider a list of all the medicines, herbs, non-prescription drugs, or dietary supplements you use. Also tell them if you smoke, drink alcohol, or use illegal drugs. Some items may interact with your medicine. What should I watch for while using this medicine? Your condition will be monitored carefully while you are receiving this medicine. You will need important blood work done while you are taking this medicine. This drug may make you feel generally unwell. This is not uncommon, as chemotherapy can affect healthy cells as well as cancer cells. Report any side effects. Continue your course of treatment even though  you feel ill unless your doctor tells you to stop. In some cases, you may  be given additional medicines to help with side effects. Follow all directions for their use. Call your doctor or health care professional for advice if you get a fever, chills or sore throat, or other symptoms of a cold or flu. Do not treat yourself. This drug decreases your body's ability to fight infections. Try to avoid being around people who are sick. This medicine may increase your risk to bruise or bleed. Call your doctor or health care professional if you notice any unusual bleeding. Be careful brushing and flossing your teeth or using a toothpick because you may get an infection or bleed more easily. If you have any dental work done, tell your dentist you are receiving this medicine. Avoid taking products that contain aspirin, acetaminophen, ibuprofen, naproxen, or ketoprofen unless instructed by your doctor. These medicines may hide a fever. Do not become pregnant while taking this medicine. Women should inform their doctor if they wish to become pregnant or think they might be pregnant. There is a potential for serious side effects to an unborn child. Talk to your health care professional or pharmacist for more information. Do not breast-feed an infant while taking this medicine. What side effects may I notice from receiving this medicine? Side effects that you should report to your doctor or health care professional as soon as possible: -allergic reactions like skin rash, itching or hives, swelling of the face, lips, or tongue -signs of infection - fever or chills, cough, sore throat, pain or difficulty passing urine -signs of decreased platelets or bleeding - bruising, pinpoint red spots on the skin, black, tarry stools, nosebleeds -signs of decreased red blood cells - unusually weak or tired, fainting spells, lightheadedness -breathing problems -changes in hearing -changes in vision -chest pain -high blood pressure -low blood counts - This drug may decrease the number of white blood  cells, red blood cells and platelets. You may be at increased risk for infections and bleeding. -nausea and vomiting -pain, swelling, redness or irritation at the injection site -pain, tingling, numbness in the hands or feet -problems with balance, talking, walking -trouble passing urine or change in the amount of urine Side effects that usually do not require medical attention (report to your doctor or health care professional if they continue or are bothersome): -hair loss -loss of appetite -metallic taste in the mouth or changes in taste This list may not describe all possible side effects. Call your doctor for medical advice about side effects. You may report side effects to FDA at 1-800-FDA-1088. Where should I keep my medicine? This drug is given in a hospital or clinic and will not be stored at home. NOTE: This sheet is a summary. It may not cover all possible information. If you have questions about this medicine, talk to your doctor, pharmacist, or health care provider.  2018 Else[employer]/Gold Standard (2007-08-17 14:38:05) Docetaxel injection What is this medicine? DOCETAXEL (doe se TAX el) is a chemotherapy drug. It targets fast dividing cells, like cancer cells, and causes these cells to die. This medicine is used to treat many types of cancers like breast cancer, certain stomach cancers, head and neck cancer, lung cancer, and prostate cancer. This medicine may be used for other purposes; ask your health care provider or pharmacist if you have questions. COMMON BRAND NAME(S): Docefrez, Taxotere What should I tell my health care provider before I take this medicine? They need to know  if you have any of these conditions: -infection (especially a virus infection such as chickenpox, cold sores, or herpes) -liver disease -low blood counts, like low white cell, platelet, or red cell counts -an unusual or allergic reaction to docetaxel, polysorbate 80, other chemotherapy agents, other  medicines, foods, dyes, or preservatives -pregnant or trying to get pregnant -breast-feeding How should I use this medicine? This drug is given as an infusion into a vein. It is administered in a hospital or clinic by a specially trained health care professional. Talk to your pediatrician regarding the use of this medicine in children. Special care may be needed. Overdosage: If you think you have taken too much of this medicine contact a poison control center or emergency room at once. NOTE: This medicine is only for you. Do not share this medicine with others. What if I miss a dose? It is important not to miss your dose. Call your doctor or health care professional if you are unable to keep an appointment. What may interact with this medicine? -cyclosporine -erythromycin -ketoconazole -medicines to increase blood counts like filgrastim, pegfilgrastim, sargramostim -vaccines Talk to your doctor or health care professional before taking any of these medicines: -acetaminophen -aspirin -ibuprofen -ketoprofen -naproxen This list may not describe all possible interactions. Give your health care provider a list of all the medicines, herbs, non-prescription drugs, or dietary supplements you use. Also tell them if you smoke, drink alcohol, or use illegal drugs. Some items may interact with your medicine. What should I watch for while using this medicine? Your condition will be monitored carefully while you are receiving this medicine. You will need important blood work done while you are taking this medicine. This drug may make you feel generally unwell. This is not uncommon, as chemotherapy can affect healthy cells as well as cancer cells. Report any side effects. Continue your course of treatment even though you feel ill unless your doctor tells you to stop. In some cases, you may be given additional medicines to help with side effects. Follow all directions for their use. Call your doctor or  health care professional for advice if you get a fever, chills or sore throat, or other symptoms of a cold or flu. Do not treat yourself. This drug decreases your body's ability to fight infections. Try to avoid being around people who are sick. This medicine may increase your risk to bruise or bleed. Call your doctor or health care professional if you notice any unusual bleeding. This medicine may contain alcohol in the product. You may get drowsy or dizzy. Do not drive, use machinery, or do anything that needs mental alertness until you know how this medicine affects you. Do not stand or sit up quickly, especially if you are an older patient. This reduces the risk of dizzy or fainting spells. Avoid alcoholic drinks. Do not become pregnant while taking this medicine. Women should inform their doctor if they wish to become pregnant or think they might be pregnant. There is a potential for serious side effects to an unborn child. Talk to your health care professional or pharmacist for more information. Do not breast-feed an infant while taking this medicine. What side effects may I notice from receiving this medicine? Side effects that you should report to your doctor or health care professional as soon as possible: -allergic reactions like skin rash, itching or hives, swelling of the face, lips, or tongue -low blood counts - This drug may decrease the number of white blood cells, red  blood cells and platelets. You may be at increased risk for infections and bleeding. -signs of infection - fever or chills, cough, sore throat, pain or difficulty passing urine -signs of decreased platelets or bleeding - bruising, pinpoint red spots on the skin, black, tarry stools, nosebleeds -signs of decreased red blood cells - unusually weak or tired, fainting spells, lightheadedness -breathing problems -fast or irregular heartbeat -low blood pressure -mouth sores -nausea and vomiting -pain, swelling, redness or  irritation at the injection site -pain, tingling, numbness in the hands or feet -swelling of the ankle, feet, hands -weight gain Side effects that usually do not require medical attention (report to your doctor or health care professional if they continue or are bothersome): -bone pain -complete hair loss including hair on your head, underarms, pubic hair, eyebrows, and eyelashes -diarrhea -excessive tearing -changes in the color of fingernails -loosening of the fingernails -nausea -muscle pain -red flush to skin -sweating -weak or tired This list may not describe all possible side effects. Call your doctor for medical advice about side effects. You may report side effects to FDA at 1-800-FDA-1088. Where should I keep my medicine? This drug is given in a hospital or clinic and will not be stored at home. NOTE: This sheet is a summary. It may not cover all possible information. If you have questions about this medicine, talk to your doctor, pharmacist, or health care provider.  2018 [employer]/Gold Standard (2015-06-14 12:32:56) Pertuzumab injection What is this medicine? PERTUZUMAB (per TOOZ ue mab) is a monoclonal antibody. It is used to treat breast cancer. This medicine may be used for other purposes; ask your health care provider or pharmacist if you have questions. COMMON BRAND NAME(S): PERJETA What should I tell my health care provider before I take this medicine? They need to know if you have any of these conditions: -heart disease -heart failure -high blood pressure -history of irregular heart beat -recent or ongoing radiation therapy -an unusual or allergic reaction to pertuzumab, other medicines, foods, dyes, or preservatives -pregnant or trying to get pregnant -breast-feeding How should I use this medicine? This medicine is for infusion into a vein. It is given by a health care professional in a hospital or clinic setting. Talk to your pediatrician regarding the use of  this medicine in children. Special care may be needed. Overdosage: If you think you have taken too much of this medicine contact a poison control center or emergency room at once. NOTE: This medicine is only for you. Do not share this medicine with others. What if I miss a dose? It is important not to miss your dose. Call your doctor or health care professional if you are unable to keep an appointment. What may interact with this medicine? Interactions are not expected. Give your health care provider a list of all the medicines, herbs, non-prescription drugs, or dietary supplements you use. Also tell them if you smoke, drink alcohol, or use illegal drugs. Some items may interact with your medicine. This list may not describe all possible interactions. Give your health care provider a list of all the medicines, herbs, non-prescription drugs, or dietary supplements you use. Also tell them if you smoke, drink alcohol, or use illegal drugs. Some items may interact with your medicine. What should I watch for while using this medicine? Your condition will be monitored carefully while you are receiving this medicine. Report any side effects. Continue your course of treatment even though you feel ill unless your doctor tells you to  stop. Do not become pregnant while taking this medicine or for 7 months after stopping it. Women should inform their doctor if they wish to become pregnant or think they might be pregnant. Women of child-bearing potential will need to have a negative pregnancy test before starting this medicine. There is a potential for serious side effects to an unborn child. Talk to your health care professional or pharmacist for more information. Do not breast-feed an infant while taking this medicine or for 7 months after stopping it. Women must use effective birth control with this medicine. Call your doctor or health care professional for advice if you get a fever, chills or sore throat, or  other symptoms of a cold or flu. Do not treat yourself. Try to avoid being around people who are sick. You may experience fever, chills, and headache during the infusion. Report any side effects during the infusion to your health care professional. What side effects may I notice from receiving this medicine? Side effects that you should report to your doctor or health care professional as soon as possible: -breathing problems -chest pain or palpitations -dizziness -feeling faint or lightheaded -fever or chills -skin rash, itching or hives -sore throat -swelling of the face, lips, or tongue -swelling of the legs or ankles -unusually weak or tired Side effects that usually do not require medical attention (report to your doctor or health care professional if they continue or are bothersome): -diarrhea -hair loss -nausea, vomiting -tiredness This list may not describe all possible side effects. Call your doctor for medical advice about side effects. You may report side effects to FDA at 1-800-FDA-1088. Where should I keep my medicine? This drug is given in a hospital or clinic and will not be stored at home. NOTE: This sheet is a summary. It may not cover all possible information. If you have questions about this medicine, talk to your doctor, pharmacist, or health care provider.  2018 Elsevier/Gold Standard (2015-06-14 12:08:50) Trastuzumab injection for infusion What is this medicine? TRASTUZUMAB (tras TOO zoo mab) is a monoclonal antibody. It is used to treat breast cancer and stomach cancer. This medicine may be used for other purposes; ask your health care provider or pharmacist if you have questions. COMMON BRAND NAME(S): Herceptin What should I tell my health care provider before I take this medicine? They need to know if you have any of these conditions: -heart disease -heart failure -lung or breathing disease, like asthma -an unusual or allergic reaction to trastuzumab,  benzyl alcohol, or other medications, foods, dyes, or preservatives -pregnant or trying to get pregnant -breast-feeding How should I use this medicine? This drug is given as an infusion into a vein. It is administered in a hospital or clinic by a specially trained health care professional. Talk to your pediatrician regarding the use of this medicine in children. This medicine is not approved for use in children. Overdosage: If you think you have taken too much of this medicine contact a poison control center or emergency room at once. NOTE: This medicine is only for you. Do not share this medicine with others. What if I miss a dose? It is important not to miss a dose. Call your doctor or health care professional if you are unable to keep an appointment. What may interact with this medicine? This medicine may interact with the following medications: -certain types of chemotherapy, such as daunorubicin, doxorubicin, epirubicin, and idarubicin This list may not describe all possible interactions. Give your health care provider  a list of all the medicines, herbs, non-prescription drugs, or dietary supplements you use. Also tell them if you smoke, drink alcohol, or use illegal drugs. Some items may interact with your medicine. What should I watch for while using this medicine? Visit your doctor for checks on your progress. Report any side effects. Continue your course of treatment even though you feel ill unless your doctor tells you to stop. Call your doctor or health care professional for advice if you get a fever, chills or sore throat, or other symptoms of a cold or flu. Do not treat yourself. Try to avoid being around people who are sick. You may experience fever, chills and shaking during your first infusion. These effects are usually mild and can be treated with other medicines. Report any side effects during the infusion to your health care professional. Fever and chills usually do not happen  with later infusions. Do not become pregnant while taking this medicine or for 7 months after stopping it. Women should inform their doctor if they wish to become pregnant or think they might be pregnant. Women of child-bearing potential will need to have a negative pregnancy test before starting this medicine. There is a potential for serious side effects to an unborn child. Talk to your health care professional or pharmacist for more information. Do not breast-feed an infant while taking this medicine or for 7 months after stopping it. Women must use effective birth control with this medicine. What side effects may I notice from receiving this medicine? Side effects that you should report to your doctor or health care professional as soon as possible: -allergic reactions like skin rash, itching or hives, swelling of the face, lips, or tongue -chest pain or palpitations -cough -dizziness -feeling faint or lightheaded, falls -fever -general ill feeling or flu-like symptoms -signs of worsening heart failure like breathing problems; swelling in your legs and feet -unusually weak or tired Side effects that usually do not require medical attention (report to your doctor or health care professional if they continue or are bothersome): -bone pain -changes in taste -diarrhea -joint pain -nausea/vomiting -weight loss This list may not describe all possible side effects. Call your doctor for medical advice about side effects. You may report side effects to FDA at 1-800-FDA-1088. Where should I keep my medicine? This drug is given in a hospital or clinic and will not be stored at home. NOTE: This sheet is a summary. It may not cover all possible information. If you have questions about this medicine, talk to your doctor, pharmacist, or health care provider.  2018 Elsevier/Gold Standard (2016-05-06 14:37:52)

## 2017-06-19 NOTE — Progress Notes (Signed)
BP 94/51 during duction of taxotere. Given soft drink

## 2017-06-22 ENCOUNTER — Telehealth: Payer: Self-pay | Admitting: Hematology & Oncology

## 2017-06-22 ENCOUNTER — Inpatient Hospital Stay: Payer: Self-pay

## 2017-06-22 NOTE — Telephone Encounter (Signed)
Patient Name:    Sophia [name]  DOB:        1991-04-14                MRN: [id number] [place] / Drug: [facility] p: [phone] Coverage Dates: 06/15/2017 - 06/15/2018 Beginning DOS: 06/22/2017 Reason for Enrollment: No insurance Other Drug Assistance Approvals: yes Date / Reason Drug Assistance Closed:  Comments: Id: [id number] Lupron 3.75    Instructions:  Email form with all information to: Shelia Fogleman: [email or web address]  For [place] and [place] campuses that have Personnel officer, also include the Engineer, materials at your specific campus in your email communication.

## 2017-06-22 NOTE — Telephone Encounter (Signed)
Patient Name:    Sophia [name]  DOB:        1990-08-08                MRN: [id number] [place] / Drug: [employer] safety net p: [phone] Coverage Dates: 06/17/2017 - 06/17/2018 Beginning DOS: 06/22/2017 Reason for Enrollment: No insurance Other Drug Assistance Approvals: yes Date / Reason Drug Assistance Closed:  Comments: Id: [id number] Neulasta   Instructions:  Email form with all information to: Shelia Fogleman: [email or web address]  For [place] and [place] campuses that have Personnel officer, also include the Engineer, materials at your specific campus in your email communication.

## 2017-06-23 ENCOUNTER — Telehealth: Payer: Self-pay | Admitting: Hematology & Oncology

## 2017-06-23 NOTE — Telephone Encounter (Signed)
Patient Name:    Sophia [name]  DOB:        1990/08/08                MRN: [id number] [place] / Drug: [facility] [employer] p: [phone] Coverage Dates: 06/14/2017 - 06/17/2020 Beginning DOS: 06/22/2017 Reason for Enrollment: No insurance Other Drug Assistance Approvals: yes Date / Reason Drug Assistance Closed:  Comments: Id: [id number] Herceptin  perjeta   Instructions:  Email form with all information to: Shelia Fogleman: [email or web address]  For [place] and [place] campuses that have Personnel officer, also include the Engineer, materials at your specific campus in your email communication.

## 2017-06-24 ENCOUNTER — Encounter (HOSPITAL_COMMUNITY)
Admission: RE | Admit: 2017-06-24 | Discharge: 2017-06-24 | Disposition: A | Discharge status: Home / Self Care | Payer: Self-pay | Source: Ambulatory Visit | Attending: Hematology & Oncology | Admitting: Hematology & Oncology

## 2017-06-24 DIAGNOSIS — Z171 Estrogen receptor negative status [ER-]: Secondary | ICD-10-CM | POA: Insufficient documentation

## 2017-06-24 DIAGNOSIS — C50911 Malignant neoplasm of unspecified site of right female breast: Secondary | ICD-10-CM | POA: Insufficient documentation

## 2017-06-24 LAB — GLUCOSE, CAPILLARY: Glucose-Capillary: 84 mg/dL (ref 65–99)

## 2017-06-24 MED ORDER — FLUDEOXYGLUCOSE F - 18 (FDG) INJECTION
8.8000 | Freq: Once | INTRAVENOUS | Status: AC | PRN
  Administered 2017-06-24: 8.8 via INTRAVENOUS

## 2017-06-24 MED FILL — ONDANSETRON HCL 8 MG TAB: 8 | 15 days supply | Qty: 30 | Fill #0

## 2017-06-25 ENCOUNTER — Telehealth: Payer: Self-pay | Admitting: *Deleted

## 2017-06-25 NOTE — Telephone Encounter (Addendum)
Patient is aware of results  ----- Message from Volanda Napoleon, MD sent at 06/24/2017  5:17 PM EST ----- Call - PET scan does NOT show any metastatic disease!!  Everything is confined to the right breast and lymph nodes!!  This is exactly what I expected!!  Great job!!!  Laurey Arrow

## 2017-06-26 ENCOUNTER — Telehealth: Payer: Self-pay | Admitting: Genetics

## 2017-06-26 ENCOUNTER — Encounter: Payer: Self-pay | Admitting: Genetics

## 2017-06-26 ENCOUNTER — Ambulatory Visit: Payer: Self-pay | Admitting: Genetics

## 2017-06-26 DIAGNOSIS — Z1501 Genetic susceptibility to malignant neoplasm of breast: Secondary | ICD-10-CM

## 2017-06-26 DIAGNOSIS — Z1379 Encounter for other screening for genetic and chromosomal anomalies: Secondary | ICD-10-CM

## 2017-06-26 DIAGNOSIS — Z1509 Genetic susceptibility to other malignant neoplasm: Secondary | ICD-10-CM

## 2017-06-26 DIAGNOSIS — Z803 Family history of malignant neoplasm of breast: Secondary | ICD-10-CM

## 2017-06-26 NOTE — Progress Notes (Signed)
GENETIC TEST RESULTS   Patient Name: Sophia [name] Patient Age: 27 y.o. Encounter Date: 06/26/2017  Referring Provider: Burney Gauze, MD  [name] was seen in the [facility] on 06/17/2017 due to a personal and family history of cancer and concern regarding a hereditary predisposition to cancer in the family. Please refer to the prior Genetics clinic note for more information regarding [name]'s medical and family histories and our assessment at the time.   FAMILY HISTORY:  We obtained a detailed, 4-generation family history.  Significant diagnoses are listed below: Family History  Problem Relation Age of Onset  . Breast cancer Mother   . Breast cancer Maternal Grandmother 60       metastatic  . Breast cancer Other    [name] has 2 sons ages 32 and 54.  [name] has 2 maternal half sisters (57 and 40) and a maternal half-brother who is 66.    [name] does not know any information about her father and paternal family history.   [name]' mother is 50 with no history of cancer.  She has her uterus and ovaries intact.  [name] has 2 maternal aunts with no history of cancer and a maternal uncle with no history of cancer.  [name] has several maternal cousins who are younger than her.  They have no history of cancer.  [name]' maternal grandfather died >50 with no history of cancer.  [name]' maternal grandmother is currently 73 years old with metastatic breast cancer that was diagnosed in her 46's.  This grandmother has a sister who had breast cancer (patient's great aunt).  This maternal grandmother also has 2 aunts with breast cancer.    [name] is unaware of previous family history of genetic testing for hereditary cancer risks. Patient's maternal ancestors are of Caucasian/Norther European descent, and paternal ancestors are of unknown-guesses caucasian/N. European descent. There is no reported Ashkenazi Jewish ancestry. There is no known  consanguinity.  GENETIC TESTING:  At the time of [name]'s visit, we recommended she pursue genetic testing of the Common Hereditary Cancer Panel test.   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  A variant of uncertain significance was also noted in the gene BARD1 c.1409A>G (p.Asn470Ser).   The following genes were analyzed by this genetic test:   APC, ATM, AXIN2, BARD1, BMPR1A, BRCA2, BRIP1, CDH1, CDK4, CDKN2A (p14ARF), CDKN2A (p16INK4a), CHEK2, CTNNA1, DICER1, EPCAM, GREM1, KIT, MEN1, MLH1, MSH2, MSH3, MSH6, MUTYH, NBN, NF1, NHTL1, PALB2, PDGFRA, PMS2, POLD1, POLE, PTEN, RAD50, RAD51C, RAD51D, SDHB, SDHC, SDHD, SMAD4, SMARCA4. STK11, TP53, TSC1, TSC2, VHL, SDHA, HOXB13.     Genetic testing did detect a Variant of Unknown Significance in the BARD1 gene called c.1409A>G (p.Asn470Ser).  At this time, it is unknown if this variant is associated with increased cancer risk or if this is a normal finding, but most variants such as this get reclassified to being inconsequential. It should not be used to make medical management decisions. With time, we suspect the lab will determine the significance of this variant, if any. If we do learn more about it, we will try to contact [name] to discuss it further. However, it is important to stay in touch with [place] periodically and keep the address and phone number up to date.  MEDICAL MANAGEMENT: Women who have a BRCA mutation have  a significantly increased risk to develop breast and ovarian cancer. It does increase a woman's risk to develop a second primary breast cancer as well.  Individuals with BRCA1 mutations are also at a somewhat elevated risk to develop pancreatic cancer an melanoma.     We discussed with [name] the options to have a prophylactic bilateral mastectomy  as well as increased/high risk breast screening that includes yearly mammograms, yearly breast MRI, twice-yearly clinical breast exams through a high-risk clinic, and monthly self-breast exams.  This result may or may not impact the surgical decision [name] and her physicians make regarding her upcoming breast surgery for her current cancer diagnosis.    To reduce the risk for ovarian cancer, we recommend [name]  have a prophylactic bilateral salpingo-oophorectomy when childbearing is completed, if planned. For Individuals with BRCA1 mutations this is recommended to occur between the ages of 35-40.   [name] should discuss the timing of a BSO with her doctors and take into consideration many factors such as childbearing, reduction in breast cancer risk when removed before menopause, et.c   We discussed that screening with CA-125 blood tests and transvaginal ultrasounds can be done twice per year. However, these tests have not been shown to detect ovarian cancer at an early stage.  RISK REDUCTION: There are several other options that can be offered to individuals who are carriers for BRCA mutations that will reduce the risk to develop  cancer.   The use of oral contraceptives can lower the risk for ovarian cancer, and, per case control studies, does not significantly increase the risk for breast cancer in BRCA patients. Case control studies have shown that oral contraceptives can lower the risk for ovarian cancer in women with BRCA mutations. Additionally, a more recent meta-analysis, including one corhort (n=3,181) and one case control study (1,096 cases and 2,878 controls) also showed an inverse correlation between ovarian cancer and ever having used oral contraceptives (OR, 0.58; 95% CI = 0.46-0.73). Studies on oral contraceptives and breast cancer have been conflicting, with some studies suggesting that there is not an increased risk for breast cancer in BRCA mutation carriers, while  others suggest that there could be a risk. That said, two meta-analysis studies have shown that there is not an increased risk for breast cancer with oral contraceptive use in BRCA1 and BRCA2 carriers.   In individuals who have a prophylactic bilateral salpino-oophorectomy (BSO), the risk for breast cancer is reduced by up to 50%. It has been reported that short term hormone replacement therapy in women undergoing prophylactic BSO does not negate the reduction of breast cancer risk associated with surgery (1.2018 NCCN guidelines).  FAMILY MEMBERS: It is important that all of Ms. Zhen's relatives (both men and women) know of the presence of this gene mutation. Site-specific genetic testing can sort out who in the family is at risk and who is not.   We discussed that men with BRCA1 pathogenic variants are also at increased risk to develop cancers including female breast cancer, prostate cancer, pancreatic caner, and melanoma if they have a BRCA1 pathogenic variant.   Ms. Kantor children are have a 50% chance to have inherited this mutation. However, they are relatively young and this will not be of any consequence to them for several years. We do not test children because there is no risk to them until they are adults. We recommend they have genetic counseling and testing by the time they are in their 64s.  At this time we do not know if this pathogenic variant was identified in Ms. Rachel' mother or father's side of the family.  Ms. Bertucci has no contact with any paternal relatives, however we recommend her mother and/or maternal relatives have genetic testing to determine if this pathogenic variant was maternally inherited.  If it is maternally inherited, then her maternal half-siblings would each have a 50% chance to have inherited the familial mutation and all other maternal relatives would be at risk to also have inherited the familial mutation and be at increased cancer risk.   We discussed  that even if this BRCA1 pathogenic variant was not inherited from her mother's side of the family her maternal grandmother appears to meet medical criteria and is recommended to have genetic testing.    PLAN: 1. Ms. Sheldon' was offered the option to come in for a follow-up genetics appointment to discuss her results further.  Ms. Manahan expressed that she understands these results and will look over the summary and information we send her and will contact us if she has questions or would like to discuss this result further.    2. These results will be sent to her oncologist, Dr. Marin Olp, to follow up with her for this indication.   3. She plans to discuss these results with her family and will reach out to [place] if we can be of any assistance in coordinating genetic testing for any of her relatives.    SUPPORT AND RESOURCES: If Ms. Soliz is interested in BRCA-specific information and support, there are two groups, Facing Our Risk (www.facingourrisk.com) and Bright Pink (www.brightpink.org) which some people have found useful. They provide opportunities to speak with other individuals from high-risk families. To locate genetic counselors in other cities, visit the website of the [employer] (ArtistMovie.se) and Secretary/administrator for a Social worker by zip code.  We encouraged Ms. Deroy to remain in contact with [place] on an annual basis so we can update her personal and family histories, and let her know of advances in cancer genetics that may benefit the family. Our contact number was provided. Ms. Folden questions were answered to her satisfaction today, and she knows she is welcome to call anytime with additional questions.   Tana Felts, MS Genetic Counselor [email or web address] phone: [phone]

## 2017-06-26 NOTE — Telephone Encounter (Signed)
Revealed positive genetic testing results to Ms. Earlywine for a BRCA1 mutation c.5363G>T (p.Gly1788Val).  Also informed her that a variant of uncertain significance was found in the BARD1 genes.   We discussed that this mutation likely explains her breast cancer diagnosis.  It also indicates she is at a higher risk to develop a second breast cancer.  She had already expressed interest in having a bilateral mastectomy, and this is more information for her to use and discuss with her doctors in making this decision.  We also discussed that women with BRCA1 mutations have a significantly higher risk to develop ovarian cancer.  BSO is typically recommended between the ages of 31-40. We discussed that while breast and ovarian cancer risks are the biggest concern individuals with BRCA1 mutations, there is are slightly elevated risks for melanoma and pancreatic cancer.  We discussed that this is not a gene mutation that typically affects children.  Her sons are recommended to have genetic testing when they are adults as there are increased risks for men as well (prostate,  Female breast, pancreatic, melanoma).  We recommended Ms. Claudio' relatives have genetic testing.  At this time we do not know if this BRCA1 mutation was inherited from her mother or father's side of the family.  If this was inherited from her mother, all maternal relatives would be recommended to have genetic testing.  We discussed that even if the BRCA1 mutation was not maternal inherited, her maternal grandmother meets medical criteria for genetic testing and is recommended to have genetic testing if she has not.    Ms. Whisnant expressed that she understood this result and how if affects her and her family's treatment.  We offered her the option to come in for a follow-up appointment to discuss further if she has more questions.  We will also send her a summary letter and more information about BRCA1 mutations.  She would like to review her results  and this information first and said she will contact me if she has more questions or would like to discuss this result further.  Her results will be sent to her oncologist Dr. Marin Olp.  We informed her the laboratory, [facility] will offer free genetic testing for the familial BRCA1 mutation to all relatives for free for 90 days if they are interested in this option.

## 2017-06-29 ENCOUNTER — Encounter: Payer: Self-pay | Admitting: Genetics

## 2017-06-30 ENCOUNTER — Telehealth: Payer: Self-pay | Admitting: Genetics

## 2017-06-30 ENCOUNTER — Encounter: Payer: Self-pay | Admitting: Genetics

## 2017-06-30 NOTE — Telephone Encounter (Signed)
Sophia [name] asked how to get her mother scheduled for an appointment for genetic counseling and genetic testing for the familial BRCA1 mutations. I told her to have her mother call me directly and I would be happy to schedule her for an appointment.  Sophia [name] gave permission to discuss her genetic test results and information obtained during her genetic counseling session with her mother, Sophia [name].

## 2017-07-09 ENCOUNTER — Ambulatory Visit: Payer: Self-pay | Admitting: Hematology & Oncology

## 2017-07-09 ENCOUNTER — Other Ambulatory Visit: Payer: Self-pay

## 2017-07-09 ENCOUNTER — Other Ambulatory Visit: Payer: Self-pay | Admitting: *Deleted

## 2017-07-09 DIAGNOSIS — C50911 Malignant neoplasm of unspecified site of right female breast: Secondary | ICD-10-CM

## 2017-07-09 DIAGNOSIS — Z171 Estrogen receptor negative status [ER-]: Principal | ICD-10-CM

## 2017-07-10 ENCOUNTER — Inpatient Hospital Stay: Payer: Self-pay

## 2017-07-10 ENCOUNTER — Inpatient Hospital Stay (HOSPITAL_BASED_OUTPATIENT_CLINIC_OR_DEPARTMENT_OTHER): Payer: Self-pay | Admitting: Hematology & Oncology

## 2017-07-10 ENCOUNTER — Other Ambulatory Visit: Payer: Self-pay

## 2017-07-10 ENCOUNTER — Inpatient Hospital Stay: Payer: Self-pay | Attending: Hematology & Oncology

## 2017-07-10 VITALS — BP 113/65 | HR 84 | Temp 98.1°F | Resp 18 | Wt 181.0 lb

## 2017-07-10 DIAGNOSIS — Z1501 Genetic susceptibility to malignant neoplasm of breast: Secondary | ICD-10-CM

## 2017-07-10 DIAGNOSIS — N951 Menopausal and female climacteric states: Secondary | ICD-10-CM

## 2017-07-10 DIAGNOSIS — Z5111 Encounter for antineoplastic chemotherapy: Secondary | ICD-10-CM | POA: Insufficient documentation

## 2017-07-10 DIAGNOSIS — Z171 Estrogen receptor negative status [ER-]: Principal | ICD-10-CM

## 2017-07-10 DIAGNOSIS — C50911 Malignant neoplasm of unspecified site of right female breast: Secondary | ICD-10-CM | POA: Insufficient documentation

## 2017-07-10 DIAGNOSIS — Z5112 Encounter for antineoplastic immunotherapy: Secondary | ICD-10-CM | POA: Insufficient documentation

## 2017-07-10 LAB — COMPREHENSIVE METABOLIC PANEL
ALT: 16 U/L (ref 10–47)
AST: 21 U/L (ref 11–38)
Albumin: 3.4 g/dL — ABNORMAL LOW (ref 3.5–5.0)
Alkaline Phosphatase: 76 U/L (ref 26–84)
Anion gap: 5 (ref 5–15)
BUN: 15 mg/dL (ref 7–22)
CHLORIDE: 108 mmol/L (ref 98–108)
CO2: 29 mmol/L (ref 18–33)
Calcium: 9.5 mg/dL (ref 8.0–10.3)
Creatinine, Ser: 0.8 mg/dL (ref 0.60–1.20)
Glucose, Bld: 102 mg/dL (ref 73–118)
POTASSIUM: 3.4 mmol/L (ref 3.3–4.7)
SODIUM: 142 mmol/L (ref 128–145)
Total Bilirubin: 0.7 mg/dL (ref 0.2–1.6)
Total Protein: 7.6 g/dL (ref 6.4–8.1)

## 2017-07-10 LAB — CBC (CANCER CENTER ONLY)
HCT: 33.6 % — ABNORMAL LOW (ref 34.8–46.6)
Hemoglobin: 10.9 g/dL — ABNORMAL LOW (ref 11.6–15.9)
MCH: 29.2 pg (ref 26.0–34.0)
MCHC: 32.4 g/dL (ref 32.0–36.0)
MCV: 90.1 fL (ref 81.0–101.0)
PLATELETS: 164 10*3/uL (ref 145–400)
RBC: 3.73 MIL/uL (ref 3.70–5.32)
RDW: 14.6 % (ref 11.1–15.7)
WBC Count: 7.5 10*3/uL (ref 3.9–10.0)

## 2017-07-10 LAB — CBC WITH DIFFERENTIAL (CANCER CENTER ONLY)
Basophils Absolute: 0 10*3/uL (ref 0.0–0.1)
Basophils Relative: 0 %
EOS PCT: 1 %
Eosinophils Absolute: 0.1 10*3/uL (ref 0.0–0.5)
HCT: 34 % — ABNORMAL LOW (ref 34.8–46.6)
HEMOGLOBIN: 10.9 g/dL — AB (ref 11.6–15.9)
LYMPHS ABS: 1.2 10*3/uL (ref 0.9–3.3)
LYMPHS PCT: 17 %
MCH: 28.9 pg (ref 26.0–34.0)
MCHC: 32.1 g/dL (ref 32.0–36.0)
MCV: 90.2 fL (ref 81.0–101.0)
Monocytes Absolute: 0.5 10*3/uL (ref 0.1–0.9)
Monocytes Relative: 8 %
Neutro Abs: 5.3 10*3/uL (ref 1.5–6.5)
Neutrophils Relative %: 74 %
PLATELETS: 168 10*3/uL (ref 145–400)
RBC: 3.77 MIL/uL (ref 3.70–5.32)
RDW: 14.6 % (ref 11.1–15.7)
WBC: 7.1 10*3/uL (ref 3.9–10.0)

## 2017-07-10 MED ORDER — PALONOSETRON HCL INJECTION 0.25 MG/5ML
INTRAVENOUS | Status: AC
Start: 2017-07-10 — End: 2017-07-10
  Filled 2017-07-10: qty 5

## 2017-07-10 MED ORDER — DEXTROSE 5 % IV SOLN
75.0000 mg/m2 | Freq: Once | INTRAVENOUS | Status: AC
  Administered 2017-07-10: 150 mg via INTRAVENOUS
  Filled 2017-07-10: qty 15

## 2017-07-10 MED ORDER — DEXAMETHASONE SODIUM PHOSPHATE 10 MG/ML IJ SOLN
10.0000 mg | Freq: Once | INTRAMUSCULAR | Status: AC
  Administered 2017-07-10: 10 mg via INTRAVENOUS

## 2017-07-10 MED ORDER — SODIUM CHLORIDE 0.9 % IV SOLN: Freq: Once | INTRAVENOUS | Status: DC

## 2017-07-10 MED ORDER — PALONOSETRON HCL INJECTION 0.25 MG/5ML
0.2500 mg | Freq: Once | INTRAVENOUS | Status: AC
  Administered 2017-07-10: 0.25 mg via INTRAVENOUS

## 2017-07-10 MED ORDER — SODIUM CHLORIDE 0.9 % IJ SOLN
10.0000 mL | Freq: Once | INTRAMUSCULAR | Status: AC
  Administered 2017-07-10: 10 mL
  Filled 2017-07-10: qty 10

## 2017-07-10 MED ORDER — TRASTUZUMAB CHEMO 150 MG IV SOLR
6.0000 mg/kg | Freq: Once | INTRAVENOUS | Status: AC
  Administered 2017-07-10: 483 mg via INTRAVENOUS
  Filled 2017-07-10: qty 23

## 2017-07-10 MED ORDER — SODIUM CHLORIDE 0.9 % IV SOLN
Freq: Once | INTRAVENOUS | Status: AC
  Administered 2017-07-10: 09:00:00 via INTRAVENOUS

## 2017-07-10 MED ORDER — DEXAMETHASONE SODIUM PHOSPHATE 10 MG/ML IJ SOLN
INTRAMUSCULAR | Status: AC
  Filled 2017-07-10: qty 1

## 2017-07-10 MED ORDER — ACETAMINOPHEN 325 MG PO TABS
ORAL_TABLET | ORAL | Status: AC
  Filled 2017-07-10: qty 2

## 2017-07-10 MED ORDER — SODIUM CHLORIDE 0.9% FLUSH
10.0000 mL | INTRAVENOUS | Status: DC | PRN
  Administered 2017-07-10: 10 mL
  Filled 2017-07-10: qty 10

## 2017-07-10 MED ORDER — HEPARIN SOD (PORK) LOCK FLUSH 100 UNIT/ML IV SOLN
500.0000 [IU] | Freq: Once | INTRAVENOUS | Status: AC | PRN
  Administered 2017-07-10: 500 [IU]
  Filled 2017-07-10: qty 5

## 2017-07-10 MED ORDER — DIPHENHYDRAMINE HCL 25 MG PO CAPS
50.0000 mg | ORAL_CAPSULE | Freq: Once | ORAL | Status: AC
  Administered 2017-07-10: 50 mg via ORAL

## 2017-07-10 MED ORDER — PEGFILGRASTIM 6 MG/0.6ML ~~LOC~~ PSKT
PREFILLED_SYRINGE | SUBCUTANEOUS | Status: AC
  Filled 2017-07-10: qty 0.6

## 2017-07-10 MED ORDER — ACETAMINOPHEN 325 MG PO TABS
650.0000 mg | ORAL_TABLET | Freq: Once | ORAL | Status: AC
  Administered 2017-07-10: 650 mg via ORAL

## 2017-07-10 MED ORDER — HEPARIN SOD (PORK) LOCK FLUSH 100 UNIT/ML IV SOLN
500.0000 [IU] | Freq: Once | INTRAVENOUS | Status: DC | PRN
  Filled 2017-07-10: qty 5

## 2017-07-10 MED ORDER — SODIUM CHLORIDE 0.9 % IV SOLN
420.0000 mg | Freq: Once | INTRAVENOUS | Status: AC
  Administered 2017-07-10: 420 mg via INTRAVENOUS
  Filled 2017-07-10: qty 14

## 2017-07-10 MED ORDER — SODIUM CHLORIDE 0.9% FLUSH
10.0000 mL | INTRAVENOUS | Status: DC | PRN
  Filled 2017-07-10: qty 10

## 2017-07-10 MED ORDER — DIPHENHYDRAMINE HCL 25 MG PO CAPS
ORAL_CAPSULE | ORAL | Status: AC
  Filled 2017-07-10: qty 2

## 2017-07-10 MED ORDER — PEGFILGRASTIM 6 MG/0.6ML ~~LOC~~ PSKT
6.0000 mg | PREFILLED_SYRINGE | Freq: Once | SUBCUTANEOUS | Status: AC
  Administered 2017-07-10: 6 mg via SUBCUTANEOUS

## 2017-07-10 MED ORDER — SODIUM CHLORIDE 0.9 % IV SOLN
750.0000 mg | Freq: Once | INTRAVENOUS | Status: AC
  Administered 2017-07-10: 750 mg via INTRAVENOUS
  Filled 2017-07-10: qty 75

## 2017-07-10 NOTE — Patient Instructions (Signed)
Implanted Port Home Guide An implanted port is a type of central line that is placed under the skin. Central lines are used to provide IV access when treatment or nutrition needs to be given through a person's veins. Implanted ports are used for long-term IV access. An implanted port may be placed because:  You need IV medicine that would be irritating to the small veins in your hands or arms.  You need long-term IV medicines, such as antibiotics.  You need IV nutrition for a long period.  You need frequent blood draws for lab tests.  You need dialysis.  Implanted ports are usually placed in the chest area, but they can also be placed in the upper arm, the abdomen, or the leg. An implanted port has two main parts:  Reservoir. The reservoir is round and will appear as a small, raised area under your skin. The reservoir is the part where a needle is inserted to give medicines or draw blood.  Catheter. The catheter is a thin, flexible tube that extends from the reservoir. The catheter is placed into a large vein. Medicine that is inserted into the reservoir goes into the catheter and then into the vein.  How will I care for my incision site? Do not get the incision site wet. Bathe or shower as directed by your health care provider. How is my port accessed? Special steps must be taken to access the port:  Before the port is accessed, a numbing cream can be placed on the skin. This helps numb the skin over the port site.  Your health care provider uses a sterile technique to access the port. ? Your health care provider must put on a mask and sterile gloves. ? The skin over your port is cleaned carefully with an antiseptic and allowed to dry. ? The port is gently pinched between sterile gloves, and a needle is inserted into the port.  Only "non-coring" port needles should be used to access the port. Once the port is accessed, a blood return should be checked. This helps ensure that the port  is in the vein and is not clogged.  If your port needs to remain accessed for a constant infusion, a clear (transparent) bandage will be placed over the needle site. The bandage and needle will need to be changed every week, or as directed by your health care provider.  Keep the bandage covering the needle clean and dry. Do not get it wet. Follow your health care provider's instructions on how to take a shower or bath while the port is accessed.  If your port does not need to stay accessed, no bandage is needed over the port.  What is flushing? Flushing helps keep the port from getting clogged. Follow your health care provider's instructions on how and when to flush the port. Ports are usually flushed with saline solution or a medicine called heparin. The need for flushing will depend on how the port is used.  If the port is used for intermittent medicines or blood draws, the port will need to be flushed: ? After medicines have been given. ? After blood has been drawn. ? As part of routine maintenance.  If a constant infusion is running, the port may not need to be flushed.  How long will my port stay implanted? The port can stay in for as long as your health care provider thinks it is needed. When it is time for the port to come out, surgery will be   done to remove it. The procedure is similar to the one performed when the port was put in. When should I seek immediate medical care? When you have an implanted port, you should seek immediate medical care if:  You notice a bad smell coming from the incision site.  You have swelling, redness, or drainage at the incision site.  You have more swelling or pain at the port site or the surrounding area.  You have a fever that is not controlled with medicine.  This information is not intended to replace advice given to you by your health care provider. Make sure you discuss any questions you have with your health care provider. Document  Released: 05/12/2005 Document Revised: 10/18/2015 Document Reviewed: 01/17/2013 Elsevier Interactive Patient Education  2017 Elsevier Inc.  

## 2017-07-10 NOTE — Patient Instructions (Signed)
[facility] Discharge Instructions for Patients Receiving Chemotherapy  Today you received the following chemotherapy agents Herceptin/Perjeta/Taxotere/Carboplatin  To help prevent nausea and vomiting after your treatment, we encourage you to take your nausea medication as prescribed.  If you develop nausea and vomiting that is not controlled by your nausea medication, call the clinic.   BELOW ARE SYMPTOMS THAT SHOULD BE REPORTED IMMEDIATELY:  *FEVER GREATER THAN 100.5 F  *CHILLS WITH OR WITHOUT FEVER  NAUSEA AND VOMITING THAT IS NOT CONTROLLED WITH YOUR NAUSEA MEDICATION  *UNUSUAL SHORTNESS OF BREATH  *UNUSUAL BRUISING OR BLEEDING  TENDERNESS IN MOUTH AND THROAT WITH OR WITHOUT PRESENCE OF ULCERS  *URINARY PROBLEMS  *BOWEL PROBLEMS  UNUSUAL RASH Items with * indicate a potential emergency and should be followed up as soon as possible.  Feel free to call the clinic should you have any questions or concerns. The clinic phone number is [phone].  Please show the [place] at check-in to the Emergency Department and triage nurse.  Trastuzumab injection for infusion (Herceptin) What is this medicine? TRASTUZUMAB (tras TOO zoo mab) is a monoclonal antibody. It is used to treat breast cancer and stomach cancer. This medicine may be used for other purposes; ask your health care provider or pharmacist if you have questions. COMMON BRAND NAME(S): Herceptin What should I tell my health care provider before I take this medicine? They need to know if you have any of these conditions: -heart disease -heart failure -lung or breathing disease, like asthma -an unusual or allergic reaction to trastuzumab, benzyl alcohol, or other medications, foods, dyes, or preservatives -pregnant or trying to get pregnant -breast-feeding How should I use this medicine? This drug is given as an infusion into a vein. It is administered in a hospital or clinic by a specially  trained health care professional. Talk to your pediatrician regarding the use of this medicine in children. This medicine is not approved for use in children. Overdosage: If you think you have taken too much of this medicine contact a poison control center or emergency room at once. NOTE: This medicine is only for you. Do not share this medicine with others. What if I miss a dose? It is important not to miss a dose. Call your doctor or health care professional if you are unable to keep an appointment. What may interact with this medicine? This medicine may interact with the following medications: -certain types of chemotherapy, such as daunorubicin, doxorubicin, epirubicin, and idarubicin This list may not describe all possible interactions. Give your health care provider a list of all the medicines, herbs, non-prescription drugs, or dietary supplements you use. Also tell them if you smoke, drink alcohol, or use illegal drugs. Some items may interact with your medicine. What should I watch for while using this medicine? Visit your doctor for checks on your progress. Report any side effects. Continue your course of treatment even though you feel ill unless your doctor tells you to stop. Call your doctor or health care professional for advice if you get a fever, chills or sore throat, or other symptoms of a cold or flu. Do not treat yourself. Try to avoid being around people who are sick. You may experience fever, chills and shaking during your first infusion. These effects are usually mild and can be treated with other medicines. Report any side effects during the infusion to your health care professional. Fever and chills usually do not happen with later infusions. Do not become pregnant while  taking this medicine or for 7 months after stopping it. Women should inform their doctor if they wish to become pregnant or think they might be pregnant. Women of child-bearing potential will need to have a  negative pregnancy test before starting this medicine. There is a potential for serious side effects to an unborn child. Talk to your health care professional or pharmacist for more information. Do not breast-feed an infant while taking this medicine or for 7 months after stopping it. Women must use effective birth control with this medicine. What side effects may I notice from receiving this medicine? Side effects that you should report to your doctor or health care professional as soon as possible: -allergic reactions like skin rash, itching or hives, swelling of the face, lips, or tongue -chest pain or palpitations -cough -dizziness -feeling faint or lightheaded, falls -fever -general ill feeling or flu-like symptoms -signs of worsening heart failure like breathing problems; swelling in your legs and feet -unusually weak or tired Side effects that usually do not require medical attention (report to your doctor or health care professional if they continue or are bothersome): -bone pain -changes in taste -diarrhea -joint pain -nausea/vomiting -weight loss This list may not describe all possible side effects. Call your doctor for medical advice about side effects. You may report side effects to FDA at 1-800-FDA-1088. Where should I keep my medicine? This drug is given in a hospital or clinic and will not be stored at home. NOTE: This sheet is a summary. It may not cover all possible information. If you have questions about this medicine, talk to your doctor, pharmacist, or health care provider.  2018 Elsevier/Gold Standard (2016-05-06 14:37:52)  Pertuzumab injection (Perjeta) What is this medicine? PERTUZUMAB (per TOOZ ue mab) is a monoclonal antibody. It is used to treat breast cancer. This medicine may be used for other purposes; ask your health care provider or pharmacist if you have questions. COMMON BRAND NAME(S): PERJETA What should I tell my health care provider before I take  this medicine? They need to know if you have any of these conditions: -heart disease -heart failure -high blood pressure -history of irregular heart beat -recent or ongoing radiation therapy -an unusual or allergic reaction to pertuzumab, other medicines, foods, dyes, or preservatives -pregnant or trying to get pregnant -breast-feeding How should I use this medicine? This medicine is for infusion into a vein. It is given by a health care professional in a hospital or clinic setting. Talk to your pediatrician regarding the use of this medicine in children. Special care may be needed. Overdosage: If you think you have taken too much of this medicine contact a poison control center or emergency room at once. NOTE: This medicine is only for you. Do not share this medicine with others. What if I miss a dose? It is important not to miss your dose. Call your doctor or health care professional if you are unable to keep an appointment. What may interact with this medicine? Interactions are not expected. Give your health care provider a list of all the medicines, herbs, non-prescription drugs, or dietary supplements you use. Also tell them if you smoke, drink alcohol, or use illegal drugs. Some items may interact with your medicine. This list may not describe all possible interactions. Give your health care provider a list of all the medicines, herbs, non-prescription drugs, or dietary supplements you use. Also tell them if you smoke, drink alcohol, or use illegal drugs. Some items may interact with your  medicine. What should I watch for while using this medicine? Your condition will be monitored carefully while you are receiving this medicine. Report any side effects. Continue your course of treatment even though you feel ill unless your doctor tells you to stop. Do not become pregnant while taking this medicine or for 7 months after stopping it. Women should inform their doctor if they wish to become  pregnant or think they might be pregnant. Women of child-bearing potential will need to have a negative pregnancy test before starting this medicine. There is a potential for serious side effects to an unborn child. Talk to your health care professional or pharmacist for more information. Do not breast-feed an infant while taking this medicine or for 7 months after stopping it. Women must use effective birth control with this medicine. Call your doctor or health care professional for advice if you get a fever, chills or sore throat, or other symptoms of a cold or flu. Do not treat yourself. Try to avoid being around people who are sick. You may experience fever, chills, and headache during the infusion. Report any side effects during the infusion to your health care professional. What side effects may I notice from receiving this medicine? Side effects that you should report to your doctor or health care professional as soon as possible: -breathing problems -chest pain or palpitations -dizziness -feeling faint or lightheaded -fever or chills -skin rash, itching or hives -sore throat -swelling of the face, lips, or tongue -swelling of the legs or ankles -unusually weak or tired Side effects that usually do not require medical attention (report to your doctor or health care professional if they continue or are bothersome): -diarrhea -hair loss -nausea, vomiting -tiredness This list may not describe all possible side effects. Call your doctor for medical advice about side effects. You may report side effects to FDA at 1-800-FDA-1088. Where should I keep my medicine? This drug is given in a hospital or clinic and will not be stored at home. NOTE: This sheet is a summary. It may not cover all possible information. If you have questions about this medicine, talk to your doctor, pharmacist, or health care provider.  2018 Elsevier/Gold Standard (2015-06-14 12:08:50)  Docetaxel injection  (Taxotere) What is this medicine? DOCETAXEL (doe se TAX el) is a chemotherapy drug. It targets fast dividing cells, like cancer cells, and causes these cells to die. This medicine is used to treat many types of cancers like breast cancer, certain stomach cancers, head and neck cancer, lung cancer, and prostate cancer. This medicine may be used for other purposes; ask your health care provider or pharmacist if you have questions. COMMON BRAND NAME(S): Docefrez, Taxotere What should I tell my health care provider before I take this medicine? They need to know if you have any of these conditions: -infection (especially a virus infection such as chickenpox, cold sores, or herpes) -liver disease -low blood counts, like low white cell, platelet, or red cell counts -an unusual or allergic reaction to docetaxel, polysorbate 80, other chemotherapy agents, other medicines, foods, dyes, or preservatives -pregnant or trying to get pregnant -breast-feeding How should I use this medicine? This drug is given as an infusion into a vein. It is administered in a hospital or clinic by a specially trained health care professional. Talk to your pediatrician regarding the use of this medicine in children. Special care may be needed. Overdosage: If you think you have taken too much of this medicine contact a poison control center  or emergency room at once. NOTE: This medicine is only for you. Do not share this medicine with others. What if I miss a dose? It is important not to miss your dose. Call your doctor or health care professional if you are unable to keep an appointment. What may interact with this medicine? -cyclosporine -erythromycin -ketoconazole -medicines to increase blood counts like filgrastim, pegfilgrastim, sargramostim -vaccines Talk to your doctor or health care professional before taking any of these medicines: -acetaminophen -aspirin -ibuprofen -ketoprofen -naproxen This list may not  describe all possible interactions. Give your health care provider a list of all the medicines, herbs, non-prescription drugs, or dietary supplements you use. Also tell them if you smoke, drink alcohol, or use illegal drugs. Some items may interact with your medicine. What should I watch for while using this medicine? Your condition will be monitored carefully while you are receiving this medicine. You will need important blood work done while you are taking this medicine. This drug may make you feel generally unwell. This is not uncommon, as chemotherapy can affect healthy cells as well as cancer cells. Report any side effects. Continue your course of treatment even though you feel ill unless your doctor tells you to stop. In some cases, you may be given additional medicines to help with side effects. Follow all directions for their use. Call your doctor or health care professional for advice if you get a fever, chills or sore throat, or other symptoms of a cold or flu. Do not treat yourself. This drug decreases your body's ability to fight infections. Try to avoid being around people who are sick. This medicine may increase your risk to bruise or bleed. Call your doctor or health care professional if you notice any unusual bleeding. This medicine may contain alcohol in the product. You may get drowsy or dizzy. Do not drive, use machinery, or do anything that needs mental alertness until you know how this medicine affects you. Do not stand or sit up quickly, especially if you are an older patient. This reduces the risk of dizzy or fainting spells. Avoid alcoholic drinks. Do not become pregnant while taking this medicine. Women should inform their doctor if they wish to become pregnant or think they might be pregnant. There is a potential for serious side effects to an unborn child. Talk to your health care professional or pharmacist for more information. Do not breast-feed an infant while taking this  medicine. What side effects may I notice from receiving this medicine? Side effects that you should report to your doctor or health care professional as soon as possible: -allergic reactions like skin rash, itching or hives, swelling of the face, lips, or tongue -low blood counts - This drug may decrease the number of white blood cells, red blood cells and platelets. You may be at increased risk for infections and bleeding. -signs of infection - fever or chills, cough, sore throat, pain or difficulty passing urine -signs of decreased platelets or bleeding - bruising, pinpoint red spots on the skin, black, tarry stools, nosebleeds -signs of decreased red blood cells - unusually weak or tired, fainting spells, lightheadedness -breathing problems -fast or irregular heartbeat -low blood pressure -mouth sores -nausea and vomiting -pain, swelling, redness or irritation at the injection site -pain, tingling, numbness in the hands or feet -swelling of the ankle, feet, hands -weight gain Side effects that usually do not require medical attention (report to your doctor or health care professional if they continue or are bothersome): -  bone pain -complete hair loss including hair on your head, underarms, pubic hair, eyebrows, and eyelashes -diarrhea -excessive tearing -changes in the color of fingernails -loosening of the fingernails -nausea -muscle pain -red flush to skin -sweating -weak or tired This list may not describe all possible side effects. Call your doctor for medical advice about side effects. You may report side effects to FDA at 1-800-FDA-1088. Where should I keep my medicine? This drug is given in a hospital or clinic and will not be stored at home. NOTE: This sheet is a summary. It may not cover all possible information. If you have questions about this medicine, talk to your doctor, pharmacist, or health care provider.  2018 Elsevier/Gold Standard (2015-06-14  12:32:56)  Carboplatin injection What is this medicine? CARBOPLATIN (KAR boe pla tin) is a chemotherapy drug. It targets fast dividing cells, like cancer cells, and causes these cells to die. This medicine is used to treat ovarian cancer and many other cancers. This medicine may be used for other purposes; ask your health care provider or pharmacist if you have questions. COMMON BRAND NAME(S): Paraplatin What should I tell my health care provider before I take this medicine? They need to know if you have any of these conditions: -blood disorders -hearing problems -kidney disease -recent or ongoing radiation therapy -an unusual or allergic reaction to carboplatin, cisplatin, other chemotherapy, other medicines, foods, dyes, or preservatives -pregnant or trying to get pregnant -breast-feeding How should I use this medicine? This drug is usually given as an infusion into a vein. It is administered in a hospital or clinic by a specially trained health care professional. Talk to your pediatrician regarding the use of this medicine in children. Special care may be needed. Overdosage: If you think you have taken too much of this medicine contact a poison control center or emergency room at once. NOTE: This medicine is only for you. Do not share this medicine with others. What if I miss a dose? It is important not to miss a dose. Call your doctor or health care professional if you are unable to keep an appointment. What may interact with this medicine? -medicines for seizures -medicines to increase blood counts like filgrastim, pegfilgrastim, sargramostim -some antibiotics like amikacin, gentamicin, neomycin, streptomycin, tobramycin -vaccines Talk to your doctor or health care professional before taking any of these medicines: -acetaminophen -aspirin -ibuprofen -ketoprofen -naproxen This list may not describe all possible interactions. Give your health care provider a list of all the  medicines, herbs, non-prescription drugs, or dietary supplements you use. Also tell them if you smoke, drink alcohol, or use illegal drugs. Some items may interact with your medicine. What should I watch for while using this medicine? Your condition will be monitored carefully while you are receiving this medicine. You will need important blood work done while you are taking this medicine. This drug may make you feel generally unwell. This is not uncommon, as chemotherapy can affect healthy cells as well as cancer cells. Report any side effects. Continue your course of treatment even though you feel ill unless your doctor tells you to stop. In some cases, you may be given additional medicines to help with side effects. Follow all directions for their use. Call your doctor or health care professional for advice if you get a fever, chills or sore throat, or other symptoms of a cold or flu. Do not treat yourself. This drug decreases your body's ability to fight infections. Try to avoid being around people who are  sick. This medicine may increase your risk to bruise or bleed. Call your doctor or health care professional if you notice any unusual bleeding. Be careful brushing and flossing your teeth or using a toothpick because you may get an infection or bleed more easily. If you have any dental work done, tell your dentist you are receiving this medicine. Avoid taking products that contain aspirin, acetaminophen, ibuprofen, naproxen, or ketoprofen unless instructed by your doctor. These medicines may hide a fever. Do not become pregnant while taking this medicine. Women should inform their doctor if they wish to become pregnant or think they might be pregnant. There is a potential for serious side effects to an unborn child. Talk to your health care professional or pharmacist for more information. Do not breast-feed an infant while taking this medicine. What side effects may I notice from receiving this  medicine? Side effects that you should report to your doctor or health care professional as soon as possible: -allergic reactions like skin rash, itching or hives, swelling of the face, lips, or tongue -signs of infection - fever or chills, cough, sore throat, pain or difficulty passing urine -signs of decreased platelets or bleeding - bruising, pinpoint red spots on the skin, black, tarry stools, nosebleeds -signs of decreased red blood cells - unusually weak or tired, fainting spells, lightheadedness -breathing problems -changes in hearing -changes in vision -chest pain -high blood pressure -low blood counts - This drug may decrease the number of white blood cells, red blood cells and platelets. You may be at increased risk for infections and bleeding. -nausea and vomiting -pain, swelling, redness or irritation at the injection site -pain, tingling, numbness in the hands or feet -problems with balance, talking, walking -trouble passing urine or change in the amount of urine Side effects that usually do not require medical attention (report to your doctor or health care professional if they continue or are bothersome): -hair loss -loss of appetite -metallic taste in the mouth or changes in taste This list may not describe all possible side effects. Call your doctor for medical advice about side effects. You may report side effects to FDA at 1-800-FDA-1088. Where should I keep my medicine? This drug is given in a hospital or clinic and will not be stored at home. NOTE: This sheet is a summary. It may not cover all possible information. If you have questions about this medicine, talk to your doctor, pharmacist, or health care provider.  2018 Elsevier/Gold Standard (2007-08-17 14:38:05)

## 2017-07-10 NOTE — Progress Notes (Signed)
Hematology and Oncology Follow Up Visit  Sophia [name] [id number] [birth year] 27 y.o. 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  She had her first cycle of treatment.  She did very well with this.  She is already noted a change in her breast.  She is not noted as much swelling.  It is not painful.  She can actually sleep on her right side.  She was tested for the BRCA gene.  To no surprise, she is BRCA POSITIVE!!!  She is having hot flashes.  We are using ovarian ablation right now.  Because of her young age, she might want to have children after she finishes all of her breast cancer therapy.  Given that she is BRCA positive, I think that we are going to have to consider ovarian removal when she knows that she will not have any children.  She has had no mouth sores.  She has lost her hair.  She has had no cough.  She is had no nausea or vomiting.  She has had no diarrhea.  There is been no rashes.  Overall, her performance status is ECOG 1.  Medications:  Current Outpatient Medications:  .  acetaminophen (TYLENOL) 500 MG tablet, Take 500 mg by mouth every 6 (six) hours as needed., Disp: , Rfl:  .  dexamethasone (DECADRON) 4 MG tablet, Take 2 tablets (8 mg total) by mouth 2 (two) times daily. Start the day before Taxotere. Then again the day after chemo for 3 days., Disp: 30 tablet, Rfl: 1 .  leuprolide (LUPRON DEPOT, 38-MONTH,) 3.75 MG injection, Inject 3.75 mg into the muscle every 28 (twenty-eight) days., Disp: 1 each, Rfl: 11 .  lidocaine-prilocaine (EMLA) cream, Apply to affected area once, Disp: 30 g, Rfl: 3 .  LORazepam (ATIVAN) 0.5 MG tablet, Take 1 tablet (0.5 mg total) by mouth every 6 (six) hours as needed (Nausea or  vomiting)., Disp: 30 tablet, Rfl: 0 .  ondansetron (ZOFRAN) 8 MG tablet, Take 1 tablet (8 mg total) by mouth 2 (two) times daily as needed for refractory nausea / vomiting. Start on day 3 after chemo., Disp: 30 tablet, Rfl: 1 .  pegfilgrastim (NEULASTA) 6 MG/0.6ML injection, Inject 0.6 mLs (6 mg total) into the skin every 21 ( twenty-one) days., Disp: 1 Syringe, Rfl: 11 .  pertuzumab (PERJETA) 420 MG/14ML SOLN, Inject 28 mLs (840 mg total) into the vein every 21 ( twenty-one) days., Disp: 28 mL, Rfl: 6 .  prochlorperazine (COMPAZINE) 10 MG tablet, Take 1 tablet (10 mg total) by mouth every 6 (six) hours as needed (Nausea or vomiting)., Disp: 30 tablet, Rfl: 1 .  trastuzumab (HERCEPTIN) 150 MG SOLR injection, Inject 31 mLs (651 mg total) into the vein every 21 ( twenty-one) days. 60m/kg, Disp: 31 mL, Rfl: 6  Allergies: No Known Allergies  Past Medical History, Surgical history, Social history, and Family History were reviewed and updated.  Review of Systems: Review of Systems  Constitutional: Negative.   HENT:  Negative.   Eyes: Negative.   Respiratory: Negative.   Cardiovascular: Negative.   Gastrointestinal: Positive for nausea.  Endocrine: Positive for hot flashes.  Genitourinary: Negative.    Musculoskeletal: Negative.   Skin: Negative.   Neurological: Negative.   Hematological: Negative.  Physical Exam:  weight is 181 lb (82.1 kg). Her oral temperature is 98.1 F (36.7 C). Her blood pressure is 113/65 and her pulse is 84. Her respiration is 18 and oxygen saturation is 99%.   Wt Readings from Last 3 Encounters:  07/10/17 181 lb (82.1 kg)  06/10/17 181 lb 3.2 oz (82.2 kg)    Physical Exam  Constitutional: She is oriented to person, place, and time.  HENT:  Head: Normocephalic and atraumatic.  Mouth/Throat: Oropharynx is clear and moist.  Eyes: EOM are normal. Pupils are equal, round, and reactive to light.  Neck: Normal range of motion.  Cardiovascular: Normal rate,  regular rhythm and normal heart sounds.  Pulmonary/Chest: Effort normal and breath sounds normal.  Abdominal: Soft. Bowel sounds are normal.  Musculoskeletal: Normal range of motion. She exhibits no edema, tenderness or deformity.  Lymphadenopathy:    She has no cervical adenopathy.  Neurological: She is alert and oriented to person, place, and time.  Skin: Skin is warm and dry. No rash noted. No erythema.  Psychiatric: She has a normal mood and affect. [name]. Judgment and thought content normal.  Vitals reviewed.    Lab Results  Component Value Date   WBC 7.1 07/10/2017   HGB 12.8 06/18/2017   HCT 34.0 (L) 07/10/2017   MCV 90.2 07/10/2017   PLT 168 07/10/2017     Chemistry      Component Value Date/Time   NA 142 07/10/2017 0815   K 3.4 07/10/2017 0815   CL 108 07/10/2017 0815   CO2 29 07/10/2017 0815   BUN 15 07/10/2017 0815   CREATININE 0.80 07/10/2017 0815   CREATININE 0.50 (L) 06/19/2017 0855      Component Value Date/Time   CALCIUM 9.5 07/10/2017 0815   ALKPHOS 76 07/10/2017 0815   AST 21 07/10/2017 0815   AST 18 06/19/2017 0855   ALT 16 07/10/2017 0815   ALT 12 06/19/2017 0855   BILITOT 0.7 07/10/2017 0815   BILITOT 0.6 06/19/2017 0855      Impression and Plan: Sophia [name] is a 27 year old premenopausal white female.  She has locally advanced-likely stage III ductal carcinoma of the right breast.  She is HER-2 positive and ER negative.  By her exam, the mass in the right breast is already shrinking.  I do not feel as much fullness in the right axilla.  We will proceed with her second cycle of treatment.  Her hot flashes should indicate that she is menopausal right now.  Thus far we want given her young age.  We will plan to see her back in 3 weeks.  I still feel that 4 cycles will be appropriate.  I would then repeat her MRI and see how everything looks.  I am just happy that she looks so good.  He would never know that she is even taking  treatment.   Volanda Napoleon, MD 2/15/20198:54 AM

## 2017-07-31 ENCOUNTER — Encounter: Payer: Self-pay | Admitting: Family

## 2017-07-31 ENCOUNTER — Inpatient Hospital Stay: Payer: Self-pay

## 2017-07-31 ENCOUNTER — Inpatient Hospital Stay: Payer: Self-pay | Attending: Hematology & Oncology | Admitting: Family

## 2017-07-31 ENCOUNTER — Other Ambulatory Visit: Payer: Self-pay

## 2017-07-31 VITALS — BP 120/77 | HR 90 | Temp 97.6°F | Resp 20 | Wt 177.2 lb

## 2017-07-31 DIAGNOSIS — C50911 Malignant neoplasm of unspecified site of right female breast: Secondary | ICD-10-CM

## 2017-07-31 DIAGNOSIS — Z171 Estrogen receptor negative status [ER-]: Principal | ICD-10-CM

## 2017-07-31 DIAGNOSIS — N951 Menopausal and female climacteric states: Secondary | ICD-10-CM

## 2017-07-31 DIAGNOSIS — Z5112 Encounter for antineoplastic immunotherapy: Secondary | ICD-10-CM | POA: Insufficient documentation

## 2017-07-31 DIAGNOSIS — Z5111 Encounter for antineoplastic chemotherapy: Secondary | ICD-10-CM | POA: Insufficient documentation

## 2017-07-31 DIAGNOSIS — R5383 Other fatigue: Secondary | ICD-10-CM

## 2017-07-31 LAB — CBC WITH DIFFERENTIAL (CANCER CENTER ONLY)
BASOS ABS: 0 10*3/uL (ref 0.0–0.1)
Basophils Relative: 0 %
Eosinophils Absolute: 0 10*3/uL (ref 0.0–0.5)
Eosinophils Relative: 1 %
HCT: 34 % — ABNORMAL LOW (ref 34.8–46.6)
Hemoglobin: 10.9 g/dL — ABNORMAL LOW (ref 11.6–15.9)
LYMPHS ABS: 1.2 10*3/uL (ref 0.9–3.3)
LYMPHS PCT: 26 %
MCH: 29.8 pg (ref 26.0–34.0)
MCHC: 32.1 g/dL (ref 32.0–36.0)
MCV: 92.9 fL (ref 81.0–101.0)
MONO ABS: 0.5 10*3/uL (ref 0.1–0.9)
MONOS PCT: 10 %
Neutro Abs: 2.9 10*3/uL (ref 1.5–6.5)
Neutrophils Relative %: 63 %
Platelet Count: 225 10*3/uL (ref 145–400)
RBC: 3.66 MIL/uL — ABNORMAL LOW (ref 3.70–5.32)
RDW: 16.5 % — AB (ref 11.1–15.7)
WBC Count: 4.6 10*3/uL (ref 3.9–10.0)

## 2017-07-31 LAB — CMP (CANCER CENTER ONLY)
ALT: 19 U/L (ref 10–47)
ANION GAP: 10 (ref 5–15)
AST: 21 U/L (ref 11–38)
Albumin: 3.7 g/dL (ref 3.5–5.0)
Alkaline Phosphatase: 76 U/L (ref 26–84)
BUN: 11 mg/dL (ref 7–22)
CHLORIDE: 105 mmol/L (ref 98–108)
CO2: 28 mmol/L (ref 18–33)
Calcium: 9.3 mg/dL (ref 8.0–10.3)
Creatinine: 0.9 mg/dL (ref 0.60–1.20)
GLUCOSE: 110 mg/dL (ref 73–118)
POTASSIUM: 3.6 mmol/L (ref 3.3–4.7)
SODIUM: 143 mmol/L (ref 128–145)
Total Bilirubin: 0.5 mg/dL (ref 0.2–1.6)
Total Protein: 7.5 g/dL (ref 6.4–8.1)

## 2017-07-31 MED ORDER — PALONOSETRON HCL INJECTION 0.25 MG/5ML
INTRAVENOUS | Status: AC
  Filled 2017-07-31: qty 5

## 2017-07-31 MED ORDER — ACETAMINOPHEN 325 MG PO TABS
ORAL_TABLET | ORAL | Status: AC
  Filled 2017-07-31: qty 1

## 2017-07-31 MED ORDER — LEUPROLIDE ACETATE 3.75 MG IM KIT
3.7500 mg | PACK | Freq: Once | INTRAMUSCULAR | Status: AC
  Administered 2017-07-31: 3.75 mg via INTRAMUSCULAR
  Filled 2017-07-31: qty 3.75

## 2017-07-31 MED ORDER — PEGFILGRASTIM 6 MG/0.6ML ~~LOC~~ PSKT
6.0000 mg | PREFILLED_SYRINGE | Freq: Once | SUBCUTANEOUS | Status: AC
  Administered 2017-07-31: 6 mg via SUBCUTANEOUS

## 2017-07-31 MED ORDER — SODIUM CHLORIDE 0.9 % IV SOLN
Freq: Once | INTRAVENOUS | Status: AC
  Administered 2017-07-31: 09:00:00 via INTRAVENOUS

## 2017-07-31 MED ORDER — SODIUM CHLORIDE 0.9 % IV SOLN
6.0000 mg/kg | Freq: Once | INTRAVENOUS | Status: AC
  Administered 2017-07-31: 483 mg via INTRAVENOUS
  Filled 2017-07-31: qty 23

## 2017-07-31 MED ORDER — DEXAMETHASONE SODIUM PHOSPHATE 10 MG/ML IJ SOLN
10.0000 mg | Freq: Once | INTRAMUSCULAR | Status: AC
  Administered 2017-07-31: 10 mg via INTRAVENOUS

## 2017-07-31 MED ORDER — PEGFILGRASTIM 6 MG/0.6ML ~~LOC~~ PSKT
PREFILLED_SYRINGE | SUBCUTANEOUS | Status: AC
  Filled 2017-07-31: qty 0.6

## 2017-07-31 MED ORDER — DEXAMETHASONE SODIUM PHOSPHATE 10 MG/ML IJ SOLN
INTRAMUSCULAR | Status: AC
  Filled 2017-07-31: qty 1

## 2017-07-31 MED ORDER — HEPARIN SOD (PORK) LOCK FLUSH 100 UNIT/ML IV SOLN
500.0000 [IU] | Freq: Once | INTRAVENOUS | Status: AC | PRN
  Administered 2017-07-31: 500 [IU]
  Filled 2017-07-31: qty 5

## 2017-07-31 MED ORDER — DIPHENHYDRAMINE HCL 25 MG PO CAPS
ORAL_CAPSULE | ORAL | Status: AC
  Filled 2017-07-31: qty 2

## 2017-07-31 MED ORDER — DOCETAXEL CHEMO INJECTION 160 MG/16ML
75.0000 mg/m2 | Freq: Once | INTRAVENOUS | Status: AC
  Administered 2017-07-31: 150 mg via INTRAVENOUS
  Filled 2017-07-31: qty 15

## 2017-07-31 MED ORDER — ACETAMINOPHEN 325 MG PO TABS
650.0000 mg | ORAL_TABLET | Freq: Once | ORAL | Status: AC
  Administered 2017-07-31: 650 mg via ORAL

## 2017-07-31 MED ORDER — SODIUM CHLORIDE 0.9% FLUSH
10.0000 mL | INTRAVENOUS | Status: DC | PRN
  Administered 2017-07-31: 10 mL
  Filled 2017-07-31: qty 10

## 2017-07-31 MED ORDER — SODIUM CHLORIDE 0.9 % IV SOLN
750.0000 mg | Freq: Once | INTRAVENOUS | Status: AC
  Administered 2017-07-31: 750 mg via INTRAVENOUS
  Filled 2017-07-31: qty 75

## 2017-07-31 MED ORDER — SODIUM CHLORIDE 0.9 % IV SOLN
420.0000 mg | Freq: Once | INTRAVENOUS | Status: AC
  Administered 2017-07-31: 420 mg via INTRAVENOUS
  Filled 2017-07-31: qty 14

## 2017-07-31 MED ORDER — PALONOSETRON HCL INJECTION 0.25 MG/5ML
0.2500 mg | Freq: Once | INTRAVENOUS | Status: AC
  Administered 2017-07-31: 0.25 mg via INTRAVENOUS

## 2017-07-31 MED ORDER — DIPHENHYDRAMINE HCL 25 MG PO CAPS
50.0000 mg | ORAL_CAPSULE | Freq: Once | ORAL | Status: AC
  Administered 2017-07-31: 50 mg via ORAL

## 2017-07-31 NOTE — Progress Notes (Signed)
Hematology and Oncology Follow Up Visit  Sophia [name] [id number] 1991/03/20 27 y.o. 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 She continues to do well but still has some fatigue.  Her breast exam today showed continue improvement in the right breast. Her tumor continues to go down. The is no swelling or redness at the site.  Ca 27.29 in January was unremarkable at 18.8.  She is not having a cycle. Hormone levels today are pending.  No bleeding, bruising or petechiae. No lymphadenopathy noted on exam.  She has had minimal diarrhea controlled with imodium as needed.  Her hot flashes have been random and tolerable so far.  No fever, chills, n/v, cough, rash, dizziness, SOB, chest pain, palpitations, abdominal pain or changes in bowel or bladder habits.  No swelling, tenderness, numbness or tingling in her extremities. No c/o pain.  She has maintained a good appetite and is staying well hydrated. Her weight is stable.   ECOG Performance Status: 1 - Symptomatic but completely ambulatory  Medications:  Allergies as of 07/31/2017   No Known Allergies     Medication List        Accurate as of 07/31/17  9:05 AM. Always use your most recent med list.          acetaminophen 500 MG tablet Commonly known as:  TYLENOL Take 500 mg by mouth every 6 (six) hours as needed.   dexamethasone 4 MG tablet Commonly known as:  DECADRON Take 2 tablets (8 mg total) by mouth 2 (two) times daily. Start the day before Taxotere. Then again the day after chemo for 3 days.   leuprolide 3.75 MG injection Commonly known as:  LUPRON DEPOT (43-MONTH) Inject 3.75 mg into the muscle every 28 (twenty-eight) days.     lidocaine-prilocaine cream Commonly known as:  EMLA Apply to affected area once   LORazepam 0.5 MG tablet Commonly known as:  ATIVAN Take 1 tablet (0.5 mg total) by mouth every 6 (six) hours as needed (Nausea or vomiting).   ondansetron 4 MG disintegrating tablet Commonly known as:  ZOFRAN-ODT Take 4 mg by mouth as needed.   ondansetron 8 MG tablet Commonly known as:  ZOFRAN Take 1 tablet (8 mg total) by mouth 2 (two) times daily as needed for refractory nausea / vomiting. Start on day 3 after chemo.   pegfilgrastim 6 MG/0.6ML injection Commonly known as:  NEULASTA Inject 0.6 mLs (6 mg total) into the skin every 21 ( twenty-one) days.   pertuzumab 420 MG/14ML Soln Commonly known as:  PERJETA Inject 28 mLs (840 mg total) into the vein every 21 ( twenty-one) days.   prochlorperazine 10 MG tablet Commonly known as:  COMPAZINE Take 1 tablet (10 mg total) by mouth every 6 (six) hours as needed (Nausea or vomiting).   trastuzumab 150 MG Solr injection Commonly known as:  HERCEPTIN Inject 31 mLs (651 mg total) into the vein every 21 ( twenty-one) days. '8mg'$ /kg       Allergies: No Known Allergies  Past Medical History, Surgical history, Social history, and Family History were reviewed and updated.  Review of Systems: All other 10 point review of systems is negative.   Physical Exam:  weight is 177  lb 4 oz (80.4 kg). Her oral temperature is 97.6 F (36.4 C). Her blood pressure is 120/77 and her pulse is 90. Her respiration is 20.   Wt Readings from Last 3 Encounters:  07/31/17 177 lb 4 oz (80.4 kg)  07/10/17 181 lb (82.1 kg)  06/10/17 181 lb 3.2 oz (82.2 kg)    Ocular: Sclerae unicteric, pupils equal, round and reactive to light Ear-nose-throat: Oropharynx clear, dentition fair Lymphatic: No cervical, supraclavicular or axillary adenopathy Lungs no rales or rhonchi, good excursion bilaterally Heart regular rate and rhythm, no murmur appreciated Abd soft, nontender,  positive bowel sounds, no liver or spleen tip palpated on exam, no fluid wave  MSK no focal spinal tenderness, no joint edema Neuro: non-focal, well-oriented, appropriate affect Breasts: No change with left breast. Right breast mass continues to decrease in size. No new mass. No discharge, lesion or rash noted on exam.   Lab Results  Component Value Date   WBC 4.6 07/31/2017   HGB 12.8 06/18/2017   HCT 34.0 (L) 07/31/2017   MCV 92.9 07/31/2017   PLT 225 07/31/2017   No results found for: FERRITIN, IRON, TIBC, UIBC, IRONPCTSAT Lab Results  Component Value Date   RBC 3.66 (L) 07/31/2017   No results found for: KPAFRELGTCHN, LAMBDASER, KAPLAMBRATIO No results found for: IGGSERUM, IGA, IGMSERUM No results found for: Odetta Pink, SPEI   Chemistry      Component Value Date/Time   NA 142 07/10/2017 0815   K 3.4 07/10/2017 0815   CL 108 07/10/2017 0815   CO2 29 07/10/2017 0815   BUN 15 07/10/2017 0815   CREATININE 0.80 07/10/2017 0815   CREATININE 0.50 (L) 06/19/2017 0855      Component Value Date/Time   CALCIUM 9.5 07/10/2017 0815   ALKPHOS 76 07/10/2017 0815   AST 21 07/10/2017 0815   AST 18 06/19/2017 0855   ALT 16 07/10/2017 0815   ALT 12 06/19/2017 0855   BILITOT 0.7 07/10/2017 0815   BILITOT 0.6 06/19/2017 0855      Impression and Plan: Sophia [name] is a very pleasant 27 yo premenopausal caucasian female with locally advanced stage III ductal carcinoma of the right breast, HER-2 positive and ER negative. She is BRCA positive.  She continues to do well and her only complaint at this time is fatigue and some tolerable hot flashes.   We will proceed with cycle 3 today as planned. After cycle 4 we will repeat a breast MRI.  Hormone levels are pending. She is not having a cycle.  She will get a copy of her next schedule today before she leaves.  She promises to contact our office with any questions or concerns. We can  certainly see her sooner if need be.   Laverna Peace, NP 3/8/20199:05 AM

## 2017-07-31 NOTE — Patient Instructions (Signed)
Cyclophosphamide injection What is this medicine? CYCLOPHOSPHAMIDE (sye kloe FOSS fa mide) is a chemotherapy drug. It slows the growth of cancer cells. This medicine is used to treat many types of cancer like lymphoma, myeloma, leukemia, breast cancer, and ovarian cancer, to name a few. This medicine may be used for other purposes; ask your health care provider or pharmacist if you have questions. COMMON BRAND NAME(S): Cytoxan, Neosar What should I tell my health care provider before I take this medicine? They need to know if you have any of these conditions: -blood disorders -history of other chemotherapy -infection -kidney disease -liver disease -recent or ongoing radiation therapy -tumors in the bone marrow -an unusual or allergic reaction to cyclophosphamide, other chemotherapy, other medicines, foods, dyes, or preservatives -pregnant or trying to get pregnant -breast-feeding How should I use this medicine? This drug is usually given as an injection into a vein or muscle or by infusion into a vein. It is administered in a hospital or clinic by a specially trained health care professional. Talk to your pediatrician regarding the use of this medicine in children. Special care may be needed. Overdosage: If you think you have taken too much of this medicine contact a poison control center or emergency room at once. NOTE: This medicine is only for you. Do not share this medicine with others. What if I miss a dose? It is important not to miss your dose. Call your doctor or health care professional if you are unable to keep an appointment. What may interact with this medicine? This medicine may interact with the following medications: -amiodarone -amphotericin B -azathioprine -certain antiviral medicines for HIV or AIDS such as protease inhibitors (e.g., indinavir, ritonavir) and zidovudine -certain blood pressure medications such as benazepril, captopril, enalapril, fosinopril,  lisinopril, moexipril, monopril, perindopril, quinapril, ramipril, trandolapril -certain cancer medications such as anthracyclines (e.g., daunorubicin, doxorubicin), busulfan, cytarabine, paclitaxel, pentostatin, tamoxifen, trastuzumab -certain diuretics such as chlorothiazide, chlorthalidone, hydrochlorothiazide, indapamide, metolazone -certain medicines that treat or prevent blood clots like warfarin -certain muscle relaxants such as succinylcholine -cyclosporine -etanercept -indomethacin -medicines to increase blood counts like filgrastim, pegfilgrastim, sargramostim -medicines used as general anesthesia -metronidazole -natalizumab This list may not describe all possible interactions. Give your health care provider a list of all the medicines, herbs, non-prescription drugs, or dietary supplements you use. Also tell them if you smoke, drink alcohol, or use illegal drugs. Some items may interact with your medicine. What should I watch for while using this medicine? Visit your doctor for checks on your progress. This drug may make you feel generally unwell. This is not uncommon, as chemotherapy can affect healthy cells as well as cancer cells. Report any side effects. Continue your course of treatment even though you feel ill unless your doctor tells you to stop. Drink water or other fluids as directed. Urinate often, even at night. In some cases, you may be given additional medicines to help with side effects. Follow all directions for their use. Call your doctor or health care professional for advice if you get a fever, chills or sore throat, or other symptoms of a cold or flu. Do not treat yourself. This drug decreases your body's ability to fight infections. Try to avoid being around people who are sick. This medicine may increase your risk to bruise or bleed. Call your doctor or health care professional if you notice any unusual bleeding. Be careful brushing and flossing your teeth or using a  toothpick because you may get an infection or bleed   more easily. If you have any dental work done, tell your dentist you are receiving this medicine. You may get drowsy or dizzy. Do not drive, use machinery, or do anything that needs mental alertness until you know how this medicine affects you. Do not become pregnant while taking this medicine or for 1 year after stopping it. Women should inform their doctor if they wish to become pregnant or think they might be pregnant. Men should not father a child while taking this medicine and for 4 months after stopping it. There is a potential for serious side effects to an unborn child. Talk to your health care professional or pharmacist for more information. Do not breast-feed an infant while taking this medicine. This medicine may interfere with the ability to have a child. This medicine has caused ovarian failure in some women. This medicine has caused reduced sperm counts in some men. You should talk with your doctor or health care professional if you are concerned about your fertility. If you are going to have surgery, tell your doctor or health care professional that you have taken this medicine. What side effects may I notice from receiving this medicine? Side effects that you should report to your doctor or health care professional as soon as possible: -allergic reactions like skin rash, itching or hives, swelling of the face, lips, or tongue -low blood counts - this medicine may decrease the number of white blood cells, red blood cells and platelets. You may be at increased risk for infections and bleeding. -signs of infection - fever or chills, cough, sore throat, pain or difficulty passing urine -signs of decreased platelets or bleeding - bruising, pinpoint red spots on the skin, black, tarry stools, blood in the urine -signs of decreased red blood cells - unusually weak or tired, fainting spells, lightheadedness -breathing problems -dark  urine -dizziness -palpitations -swelling of the ankles, feet, hands -trouble passing urine or change in the amount of urine -weight gain -yellowing of the eyes or skin Side effects that usually do not require medical attention (report to your doctor or health care professional if they continue or are bothersome): -changes in nail or skin color -hair loss -missed menstrual periods -mouth sores -nausea, vomiting This list may not describe all possible side effects. Call your doctor for medical advice about side effects. You may report side effects to FDA at 1-800-FDA-1088. Where should I keep my medicine? This drug is given in a hospital or clinic and will not be stored at home. NOTE: This sheet is a summary. It may not cover all possible information. If you have questions about this medicine, talk to your doctor, pharmacist, or health care provider.  2018 Elsevier/Gold Standard (2012-03-26 16:22:58) Docetaxel injection What is this medicine? DOCETAXEL (doe se TAX el) is a chemotherapy drug. It targets fast dividing cells, like cancer cells, and causes these cells to die. This medicine is used to treat many types of cancers like breast cancer, certain stomach cancers, head and neck cancer, lung cancer, and prostate cancer. This medicine may be used for other purposes; ask your health care provider or pharmacist if you have questions. COMMON BRAND NAME(S): Docefrez, Taxotere What should I tell my health care provider before I take this medicine? They need to know if you have any of these conditions: -infection (especially a virus infection such as chickenpox, cold sores, or herpes) -liver disease -low blood counts, like low white cell, platelet, or red cell counts -an unusual or allergic reaction to docetaxel,   polysorbate 80, other chemotherapy agents, other medicines, foods, dyes, or preservatives -pregnant or trying to get pregnant -breast-feeding How should I use this medicine? This  drug is given as an infusion into a vein. It is administered in a hospital or clinic by a specially trained health care professional. Talk to your pediatrician regarding the use of this medicine in children. Special care may be needed. Overdosage: If you think you have taken too much of this medicine contact a poison control center or emergency room at once. NOTE: This medicine is only for you. Do not share this medicine with others. What if I miss a dose? It is important not to miss your dose. Call your doctor or health care professional if you are unable to keep an appointment. What may interact with this medicine? -cyclosporine -erythromycin -ketoconazole -medicines to increase blood counts like filgrastim, pegfilgrastim, sargramostim -vaccines Talk to your doctor or health care professional before taking any of these medicines: -acetaminophen -aspirin -ibuprofen -ketoprofen -naproxen This list may not describe all possible interactions. Give your health care provider a list of all the medicines, herbs, non-prescription drugs, or dietary supplements you use. Also tell them if you smoke, drink alcohol, or use illegal drugs. Some items may interact with your medicine. What should I watch for while using this medicine? Your condition will be monitored carefully while you are receiving this medicine. You will need important blood work done while you are taking this medicine. This drug may make you feel generally unwell. This is not uncommon, as chemotherapy can affect healthy cells as well as cancer cells. Report any side effects. Continue your course of treatment even though you feel ill unless your doctor tells you to stop. In some cases, you may be given additional medicines to help with side effects. Follow all directions for their use. Call your doctor or health care professional for advice if you get a fever, chills or sore throat, or other symptoms of a cold or flu. Do not treat  yourself. This drug decreases your body's ability to fight infections. Try to avoid being around people who are sick. This medicine may increase your risk to bruise or bleed. Call your doctor or health care professional if you notice any unusual bleeding. This medicine may contain alcohol in the product. You may get drowsy or dizzy. Do not drive, use machinery, or do anything that needs mental alertness until you know how this medicine affects you. Do not stand or sit up quickly, especially if you are an older patient. This reduces the risk of dizzy or fainting spells. Avoid alcoholic drinks. Do not become pregnant while taking this medicine. Women should inform their doctor if they wish to become pregnant or think they might be pregnant. There is a potential for serious side effects to an unborn child. Talk to your health care professional or pharmacist for more information. Do not breast-feed an infant while taking this medicine. What side effects may I notice from receiving this medicine? Side effects that you should report to your doctor or health care professional as soon as possible: -allergic reactions like skin rash, itching or hives, swelling of the face, lips, or tongue -low blood counts - This drug may decrease the number of white blood cells, red blood cells and platelets. You may be at increased risk for infections and bleeding. -signs of infection - fever or chills, cough, sore throat, pain or difficulty passing urine -signs of decreased platelets or bleeding - bruising, pinpoint red spots   on the skin, black, tarry stools, nosebleeds -signs of decreased red blood cells - unusually weak or tired, fainting spells, lightheadedness -breathing problems -fast or irregular heartbeat -low blood pressure -mouth sores -nausea and vomiting -pain, swelling, redness or irritation at the injection site -pain, tingling, numbness in the hands or feet -swelling of the ankle, feet, hands -weight  gain Side effects that usually do not require medical attention (report to your doctor or health care professional if they continue or are bothersome): -bone pain -complete hair loss including hair on your head, underarms, pubic hair, eyebrows, and eyelashes -diarrhea -excessive tearing -changes in the color of fingernails -loosening of the fingernails -nausea -muscle pain -red flush to skin -sweating -weak or tired This list may not describe all possible side effects. Call your doctor for medical advice about side effects. You may report side effects to FDA at 1-800-FDA-1088. Where should I keep my medicine? This drug is given in a hospital or clinic and will not be stored at home. NOTE: This sheet is a summary. It may not cover all possible information. If you have questions about this medicine, talk to your doctor, pharmacist, or health care provider.  2018 Elsevier/Gold Standard (2015-06-14 12:32:56) Pertuzumab injection What is this medicine? PERTUZUMAB (per TOOZ ue mab) is a monoclonal antibody. It is used to treat breast cancer. This medicine may be used for other purposes; ask your health care provider or pharmacist if you have questions. COMMON BRAND NAME(S): PERJETA What should I tell my health care provider before I take this medicine? They need to know if you have any of these conditions: -heart disease -heart failure -high blood pressure -history of irregular heart beat -recent or ongoing radiation therapy -an unusual or allergic reaction to pertuzumab, other medicines, foods, dyes, or preservatives -pregnant or trying to get pregnant -breast-feeding How should I use this medicine? This medicine is for infusion into a vein. It is given by a health care professional in a hospital or clinic setting. Talk to your pediatrician regarding the use of this medicine in children. Special care may be needed. Overdosage: If you think you have taken too much of this medicine  contact a poison control center or emergency room at once. NOTE: This medicine is only for you. Do not share this medicine with others. What if I miss a dose? It is important not to miss your dose. Call your doctor or health care professional if you are unable to keep an appointment. What may interact with this medicine? Interactions are not expected. Give your health care provider a list of all the medicines, herbs, non-prescription drugs, or dietary supplements you use. Also tell them if you smoke, drink alcohol, or use illegal drugs. Some items may interact with your medicine. This list may not describe all possible interactions. Give your health care provider a list of all the medicines, herbs, non-prescription drugs, or dietary supplements you use. Also tell them if you smoke, drink alcohol, or use illegal drugs. Some items may interact with your medicine. What should I watch for while using this medicine? Your condition will be monitored carefully while you are receiving this medicine. Report any side effects. Continue your course of treatment even though you feel ill unless your doctor tells you to stop. Do not become pregnant while taking this medicine or for 7 months after stopping it. Women should inform their doctor if they wish to become pregnant or think they might be pregnant. Women of child-bearing potential will need to  have a negative pregnancy test before starting this medicine. There is a potential for serious side effects to an unborn child. Talk to your health care professional or pharmacist for more information. Do not breast-feed an infant while taking this medicine or for 7 months after stopping it. Women must use effective birth control with this medicine. Call your doctor or health care professional for advice if you get a fever, chills or sore throat, or other symptoms of a cold or flu. Do not treat yourself. Try to avoid being around people who are sick. You may experience  fever, chills, and headache during the infusion. Report any side effects during the infusion to your health care professional. What side effects may I notice from receiving this medicine? Side effects that you should report to your doctor or health care professional as soon as possible: -breathing problems -chest pain or palpitations -dizziness -feeling faint or lightheaded -fever or chills -skin rash, itching or hives -sore throat -swelling of the face, lips, or tongue -swelling of the legs or ankles -unusually weak or tired Side effects that usually do not require medical attention (report to your doctor or health care professional if they continue or are bothersome): -diarrhea -hair loss -nausea, vomiting -tiredness This list may not describe all possible side effects. Call your doctor for medical advice about side effects. You may report side effects to FDA at 1-800-FDA-1088. Where should I keep my medicine? This drug is given in a hospital or clinic and will not be stored at home. NOTE: This sheet is a summary. It may not cover all possible information. If you have questions about this medicine, talk to your doctor, pharmacist, or health care provider.  2018 Elsevier/Gold Standard (2015-06-14 12:08:50) Trastuzumab injection for infusion What is this medicine? TRASTUZUMAB (tras TOO zoo mab) is a monoclonal antibody. It is used to treat breast cancer and stomach cancer. This medicine may be used for other purposes; ask your health care provider or pharmacist if you have questions. COMMON BRAND NAME(S): Herceptin What should I tell my health care provider before I take this medicine? They need to know if you have any of these conditions: -heart disease -heart failure -lung or breathing disease, like asthma -an unusual or allergic reaction to trastuzumab, benzyl alcohol, or other medications, foods, dyes, or preservatives -pregnant or trying to get pregnant -breast-feeding How  should I use this medicine? This drug is given as an infusion into a vein. It is administered in a hospital or clinic by a specially trained health care professional. Talk to your pediatrician regarding the use of this medicine in children. This medicine is not approved for use in children. Overdosage: If you think you have taken too much of this medicine contact a poison control center or emergency room at once. NOTE: This medicine is only for you. Do not share this medicine with others. What if I miss a dose? It is important not to miss a dose. Call your doctor or health care professional if you are unable to keep an appointment. What may interact with this medicine? This medicine may interact with the following medications: -certain types of chemotherapy, such as daunorubicin, doxorubicin, epirubicin, and idarubicin This list may not describe all possible interactions. Give your health care provider a list of all the medicines, herbs, non-prescription drugs, or dietary supplements you use. Also tell them if you smoke, drink alcohol, or use illegal drugs. Some items may interact with your medicine. What should I watch for while using  this medicine? Visit your doctor for checks on your progress. Report any side effects. Continue your course of treatment even though you feel ill unless your doctor tells you to stop. Call your doctor or health care professional for advice if you get a fever, chills or sore throat, or other symptoms of a cold or flu. Do not treat yourself. Try to avoid being around people who are sick. You may experience fever, chills and shaking during your first infusion. These effects are usually mild and can be treated with other medicines. Report any side effects during the infusion to your health care professional. Fever and chills usually do not happen with later infusions. Do not become pregnant while taking this medicine or for 7 months after stopping it. Women should inform  their doctor if they wish to become pregnant or think they might be pregnant. Women of child-bearing potential will need to have a negative pregnancy test before starting this medicine. There is a potential for serious side effects to an unborn child. Talk to your health care professional or pharmacist for more information. Do not breast-feed an infant while taking this medicine or for 7 months after stopping it. Women must use effective birth control with this medicine. What side effects may I notice from receiving this medicine? Side effects that you should report to your doctor or health care professional as soon as possible: -allergic reactions like skin rash, itching or hives, swelling of the face, lips, or tongue -chest pain or palpitations -cough -dizziness -feeling faint or lightheaded, falls -fever -general ill feeling or flu-like symptoms -signs of worsening heart failure like breathing problems; swelling in your legs and feet -unusually weak or tired Side effects that usually do not require medical attention (report to your doctor or health care professional if they continue or are bothersome): -bone pain -changes in taste -diarrhea -joint pain -nausea/vomiting -weight loss This list may not describe all possible side effects. Call your doctor for medical advice about side effects. You may report side effects to FDA at 1-800-FDA-1088. Where should I keep my medicine? This drug is given in a hospital or clinic and will not be stored at home. NOTE: This sheet is a summary. It may not cover all possible information. If you have questions about this medicine, talk to your doctor, pharmacist, or health care provider.  2018 Elsevier/Gold Standard (2016-05-06 14:37:52)

## 2017-08-01 LAB — FOLLICLE STIMULATING HORMONE: FSH: 13.2 m[IU]/mL

## 2017-08-01 LAB — LUTEINIZING HORMONE: LH: 0.8 m[IU]/mL

## 2017-08-05 ENCOUNTER — Telehealth: Payer: Self-pay | Admitting: Genetics

## 2017-08-05 NOTE — Telephone Encounter (Signed)
Ms. Buttery shared her genetic test results with her sister and her sister has scheduled an appointment with [place].  I called to get permission to share her genetic test report/ information obtained during genetic counseling with her sister and the lab when we order testing for her sister.

## 2017-08-06 ENCOUNTER — Telehealth: Payer: Self-pay | Admitting: Genetics

## 2017-08-06 LAB — ESTRADIOL, ULTRA SENS

## 2017-08-06 NOTE — Telephone Encounter (Signed)
Sophia [name] gave permission to share all family history/information obtained during genetic counseling, and genetic test results with her sister Darryl Lent and to send her genetic test result with her sister's sample to the laboratory for analysis.

## 2017-08-10 ENCOUNTER — Other Ambulatory Visit: Payer: Self-pay | Admitting: *Deleted

## 2017-08-10 MED ORDER — OXYBUTYNIN CHLORIDE ER 10 MG PO TB24: 10.0000 mg | ORAL_TABLET | Freq: Every day | ORAL | 3 refills | Status: DC

## 2017-08-21 ENCOUNTER — Inpatient Hospital Stay: Payer: Self-pay

## 2017-08-21 ENCOUNTER — Other Ambulatory Visit: Payer: Self-pay

## 2017-08-21 ENCOUNTER — Encounter: Payer: Self-pay | Admitting: Hematology & Oncology

## 2017-08-21 ENCOUNTER — Inpatient Hospital Stay (HOSPITAL_BASED_OUTPATIENT_CLINIC_OR_DEPARTMENT_OTHER): Payer: Self-pay | Admitting: Hematology & Oncology

## 2017-08-21 DIAGNOSIS — C50911 Malignant neoplasm of unspecified site of right female breast: Secondary | ICD-10-CM

## 2017-08-21 DIAGNOSIS — Z1501 Genetic susceptibility to malignant neoplasm of breast: Secondary | ICD-10-CM

## 2017-08-21 DIAGNOSIS — Z171 Estrogen receptor negative status [ER-]: Principal | ICD-10-CM

## 2017-08-21 DIAGNOSIS — Z95828 Presence of other vascular implants and grafts: Secondary | ICD-10-CM

## 2017-08-21 DIAGNOSIS — N951 Menopausal and female climacteric states: Secondary | ICD-10-CM

## 2017-08-21 LAB — CBC WITH DIFFERENTIAL (CANCER CENTER ONLY)
BASOS ABS: 0 10*3/uL (ref 0.0–0.1)
Basophils Relative: 0 %
Eosinophils Absolute: 0 10*3/uL (ref 0.0–0.5)
Eosinophils Relative: 0 %
HEMATOCRIT: 30.4 % — AB (ref 34.8–46.6)
HEMOGLOBIN: 9.9 g/dL — AB (ref 11.6–15.9)
LYMPHS PCT: 26 %
Lymphs Abs: 1.5 10*3/uL (ref 0.9–3.3)
MCH: 30.5 pg (ref 26.0–34.0)
MCHC: 32.6 g/dL (ref 32.0–36.0)
MCV: 93.5 fL (ref 81.0–101.0)
MONOS PCT: 11 %
Monocytes Absolute: 0.7 10*3/uL (ref 0.1–0.9)
NEUTROS ABS: 3.6 10*3/uL (ref 1.5–6.5)
NEUTROS PCT: 63 %
Platelet Count: 89 10*3/uL — ABNORMAL LOW (ref 145–400)
RBC: 3.25 MIL/uL — ABNORMAL LOW (ref 3.70–5.32)
RDW: 17.5 % — ABNORMAL HIGH (ref 11.1–15.7)
WBC Count: 5.8 10*3/uL (ref 3.9–10.0)

## 2017-08-21 LAB — CMP (CANCER CENTER ONLY)
ALBUMIN: 3.5 g/dL (ref 3.5–5.0)
ALK PHOS: 73 U/L (ref 26–84)
ALT: 27 U/L (ref 10–47)
AST: 23 U/L (ref 11–38)
Anion gap: 5 (ref 5–15)
BILIRUBIN TOTAL: 0.5 mg/dL (ref 0.2–1.6)
BUN: 12 mg/dL (ref 7–22)
CALCIUM: 9 mg/dL (ref 8.0–10.3)
CO2: 29 mmol/L (ref 18–33)
CREATININE: 0.9 mg/dL (ref 0.60–1.20)
Chloride: 106 mmol/L (ref 98–108)
Glucose, Bld: 92 mg/dL (ref 73–118)
Potassium: 3.7 mmol/L (ref 3.3–4.7)
Sodium: 140 mmol/L (ref 128–145)
Total Protein: 7.2 g/dL (ref 6.4–8.1)

## 2017-08-21 MED ORDER — DEXAMETHASONE SODIUM PHOSPHATE 10 MG/ML IJ SOLN
INTRAMUSCULAR | Status: AC
  Filled 2017-08-21: qty 1

## 2017-08-21 MED ORDER — PALONOSETRON HCL INJECTION 0.25 MG/5ML
0.2500 mg | Freq: Once | INTRAVENOUS | Status: AC
  Administered 2017-08-21: 0.25 mg via INTRAVENOUS

## 2017-08-21 MED ORDER — HEPARIN SOD (PORK) LOCK FLUSH 100 UNIT/ML IV SOLN
500.0000 [IU] | Freq: Once | INTRAVENOUS | Status: AC | PRN
  Administered 2017-08-21: 500 [IU]
  Filled 2017-08-21: qty 5

## 2017-08-21 MED ORDER — ACETAMINOPHEN 325 MG PO TABS
650.0000 mg | ORAL_TABLET | Freq: Once | ORAL | Status: AC
  Administered 2017-08-21: 650 mg via ORAL

## 2017-08-21 MED ORDER — MEGESTROL ACETATE 20 MG PO TABS: 20.0000 mg | ORAL_TABLET | Freq: Every day | ORAL | 12 refills | Status: DC

## 2017-08-21 MED ORDER — OXYBUTYNIN CHLORIDE ER 10 MG PO TB24: 10.0000 mg | ORAL_TABLET | Freq: Every day | ORAL | 12 refills | Status: DC

## 2017-08-21 MED ORDER — SODIUM CHLORIDE 0.9 % IV SOLN
420.0000 mg | Freq: Once | INTRAVENOUS | Status: AC
  Administered 2017-08-21: 420 mg via INTRAVENOUS
  Filled 2017-08-21: qty 14

## 2017-08-21 MED ORDER — SODIUM CHLORIDE 0.9 % IV SOLN
750.0000 mg | Freq: Once | INTRAVENOUS | Status: AC
  Administered 2017-08-21: 750 mg via INTRAVENOUS
  Filled 2017-08-21: qty 75

## 2017-08-21 MED ORDER — SODIUM CHLORIDE 0.9% FLUSH
10.0000 mL | INTRAVENOUS | Status: DC | PRN
  Administered 2017-08-21: 10 mL
  Filled 2017-08-21: qty 10

## 2017-08-21 MED ORDER — PALONOSETRON HCL INJECTION 0.25 MG/5ML
INTRAVENOUS | Status: AC
  Filled 2017-08-21: qty 5

## 2017-08-21 MED ORDER — DIPHENHYDRAMINE HCL 25 MG PO CAPS
50.0000 mg | ORAL_CAPSULE | Freq: Once | ORAL | Status: AC
  Administered 2017-08-21: 25 mg via ORAL

## 2017-08-21 MED ORDER — TRASTUZUMAB CHEMO 150 MG IV SOLR
6.0000 mg/kg | Freq: Once | INTRAVENOUS | Status: AC
  Administered 2017-08-21: 483 mg via INTRAVENOUS
  Filled 2017-08-21: qty 23

## 2017-08-21 MED ORDER — PEGFILGRASTIM 6 MG/0.6ML ~~LOC~~ PSKT
PREFILLED_SYRINGE | SUBCUTANEOUS | Status: AC
  Filled 2017-08-21: qty 0.6

## 2017-08-21 MED ORDER — PEGFILGRASTIM 6 MG/0.6ML ~~LOC~~ PSKT
6.0000 mg | PREFILLED_SYRINGE | Freq: Once | SUBCUTANEOUS | Status: AC
  Administered 2017-08-21: 6 mg via SUBCUTANEOUS

## 2017-08-21 MED ORDER — ACETAMINOPHEN 325 MG PO TABS
ORAL_TABLET | ORAL | Status: AC
  Filled 2017-08-21: qty 2

## 2017-08-21 MED ORDER — DOCETAXEL CHEMO INJECTION 160 MG/16ML
75.0000 mg/m2 | Freq: Once | INTRAVENOUS | Status: AC
  Administered 2017-08-21: 150 mg via INTRAVENOUS
  Filled 2017-08-21: qty 15

## 2017-08-21 MED ORDER — SODIUM CHLORIDE 0.9 % IV SOLN
Freq: Once | INTRAVENOUS | Status: AC
  Administered 2017-08-21: 09:00:00 via INTRAVENOUS

## 2017-08-21 MED ORDER — DIPHENHYDRAMINE HCL 25 MG PO CAPS
ORAL_CAPSULE | ORAL | Status: AC
  Filled 2017-08-21: qty 1

## 2017-08-21 MED ORDER — DEXAMETHASONE SODIUM PHOSPHATE 10 MG/ML IJ SOLN
10.0000 mg | Freq: Once | INTRAMUSCULAR | Status: AC
  Administered 2017-08-21: 10 mg via INTRAVENOUS

## 2017-08-21 MED FILL — MEGESTROL 20 MG TABLET: 20 | 30 days supply | Qty: 30 | Fill #0

## 2017-08-21 MED FILL — OXYBUTYNIN CL ER 10 MG TAB: 10 | 30 days supply | Qty: 30 | Fill #0

## 2017-08-21 NOTE — Patient Instructions (Signed)
Carboplatin injection What is this medicine? CARBOPLATIN (KAR boe pla tin) is a chemotherapy drug. It targets fast dividing cells, like cancer cells, and causes these cells to die. This medicine is used to treat ovarian cancer and many other cancers. This medicine may be used for other purposes; ask your health care provider or pharmacist if you have questions. COMMON BRAND NAME(S): Paraplatin What should I tell my health care provider before I take this medicine? They need to know if you have any of these conditions: -blood disorders -hearing problems -kidney disease -recent or ongoing radiation therapy -an unusual or allergic reaction to carboplatin, cisplatin, other chemotherapy, other medicines, foods, dyes, or preservatives -pregnant or trying to get pregnant -breast-feeding How should I use this medicine? This drug is usually given as an infusion into a vein. It is administered in a hospital or clinic by a specially trained health care professional. Talk to your pediatrician regarding the use of this medicine in children. Special care may be needed. Overdosage: If you think you have taken too much of this medicine contact a poison control center or emergency room at once. NOTE: This medicine is only for you. Do not share this medicine with others. What if I miss a dose? It is important not to miss a dose. Call your doctor or health care professional if you are unable to keep an appointment. What may interact with this medicine? -medicines for seizures -medicines to increase blood counts like filgrastim, pegfilgrastim, sargramostim -some antibiotics like amikacin, gentamicin, neomycin, streptomycin, tobramycin -vaccines Talk to your doctor or health care professional before taking any of these medicines: -acetaminophen -aspirin -ibuprofen -ketoprofen -naproxen This list may not describe all possible interactions. Give your health care provider a list of all the medicines, herbs,  non-prescription drugs, or dietary supplements you use. Also tell them if you smoke, drink alcohol, or use illegal drugs. Some items may interact with your medicine. What should I watch for while using this medicine? Your condition will be monitored carefully while you are receiving this medicine. You will need important blood work done while you are taking this medicine. This drug may make you feel generally unwell. This is not uncommon, as chemotherapy can affect healthy cells as well as cancer cells. Report any side effects. Continue your course of treatment even though you feel ill unless your doctor tells you to stop. In some cases, you may be given additional medicines to help with side effects. Follow all directions for their use. Call your doctor or health care professional for advice if you get a fever, chills or sore throat, or other symptoms of a cold or flu. Do not treat yourself. This drug decreases your body's ability to fight infections. Try to avoid being around people who are sick. This medicine may increase your risk to bruise or bleed. Call your doctor or health care professional if you notice any unusual bleeding. Be careful brushing and flossing your teeth or using a toothpick because you may get an infection or bleed more easily. If you have any dental work done, tell your dentist you are receiving this medicine. Avoid taking products that contain aspirin, acetaminophen, ibuprofen, naproxen, or ketoprofen unless instructed by your doctor. These medicines may hide a fever. Do not become pregnant while taking this medicine. Women should inform their doctor if they wish to become pregnant or think they might be pregnant. There is a potential for serious side effects to an unborn child. Talk to your health care professional or  pharmacist for more information. Do not breast-feed an infant while taking this medicine. What side effects may I notice from receiving this medicine? Side effects  that you should report to your doctor or health care professional as soon as possible: -allergic reactions like skin rash, itching or hives, swelling of the face, lips, or tongue -signs of infection - fever or chills, cough, sore throat, pain or difficulty passing urine -signs of decreased platelets or bleeding - bruising, pinpoint red spots on the skin, black, tarry stools, nosebleeds -signs of decreased red blood cells - unusually weak or tired, fainting spells, lightheadedness -breathing problems -changes in hearing -changes in vision -chest pain -high blood pressure -low blood counts - This drug may decrease the number of white blood cells, red blood cells and platelets. You may be at increased risk for infections and bleeding. -nausea and vomiting -pain, swelling, redness or irritation at the injection site -pain, tingling, numbness in the hands or feet -problems with balance, talking, walking -trouble passing urine or change in the amount of urine Side effects that usually do not require medical attention (report to your doctor or health care professional if they continue or are bothersome): -hair loss -loss of appetite -metallic taste in the mouth or changes in taste This list may not describe all possible side effects. Call your doctor for medical advice about side effects. You may report side effects to FDA at 1-800-FDA-1088. Where should I keep my medicine? This drug is given in a hospital or clinic and will not be stored at home. NOTE: This sheet is a summary. It may not cover all possible information. If you have questions about this medicine, talk to your doctor, pharmacist, or health care provider.  2018 [employer] Standard (2007-08-17 14:38:05) Pertuzumab injection What is this medicine? PERTUZUMAB (per TOOZ ue mab) is a monoclonal antibody. It is used to treat breast cancer. This medicine may be used for other purposes; ask your health care provider or pharmacist if  you have questions. COMMON BRAND NAME(S): PERJETA What should I tell my health care provider before I take this medicine? They need to know if you have any of these conditions: -heart disease -heart failure -high blood pressure -history of irregular heart beat -recent or ongoing radiation therapy -an unusual or allergic reaction to pertuzumab, other medicines, foods, dyes, or preservatives -pregnant or trying to get pregnant -breast-feeding How should I use this medicine? This medicine is for infusion into a vein. It is given by a health care professional in a hospital or clinic setting. Talk to your pediatrician regarding the use of this medicine in children. Special care may be needed. Overdosage: If you think you have taken too much of this medicine contact a poison control center or emergency room at once. NOTE: This medicine is only for you. Do not share this medicine with others. What if I miss a dose? It is important not to miss your dose. Call your doctor or health care professional if you are unable to keep an appointment. What may interact with this medicine? Interactions are not expected. Give your health care provider a list of all the medicines, herbs, non-prescription drugs, or dietary supplements you use. Also tell them if you smoke, drink alcohol, or use illegal drugs. Some items may interact with your medicine. This list may not describe all possible interactions. Give your health care provider a list of all the medicines, herbs, non-prescription drugs, or dietary supplements you use. Also tell them if you smoke, drink  alcohol, or use illegal drugs. Some items may interact with your medicine. What should I watch for while using this medicine? Your condition will be monitored carefully while you are receiving this medicine. Report any side effects. Continue your course of treatment even though you feel ill unless your doctor tells you to stop. Do not become pregnant while  taking this medicine or for 7 months after stopping it. Women should inform their doctor if they wish to become pregnant or think they might be pregnant. Women of child-bearing potential will need to have a negative pregnancy test before starting this medicine. There is a potential for serious side effects to an unborn child. Talk to your health care professional or pharmacist for more information. Do not breast-feed an infant while taking this medicine or for 7 months after stopping it. Women must use effective birth control with this medicine. Call your doctor or health care professional for advice if you get a fever, chills or sore throat, or other symptoms of a cold or flu. Do not treat yourself. Try to avoid being around people who are sick. You may experience fever, chills, and headache during the infusion. Report any side effects during the infusion to your health care professional. What side effects may I notice from receiving this medicine? Side effects that you should report to your doctor or health care professional as soon as possible: -breathing problems -chest pain or palpitations -dizziness -feeling faint or lightheaded -fever or chills -skin rash, itching or hives -sore throat -swelling of the face, lips, or tongue -swelling of the legs or ankles -unusually weak or tired Side effects that usually do not require medical attention (report to your doctor or health care professional if they continue or are bothersome): -diarrhea -hair loss -nausea, vomiting -tiredness This list may not describe all possible side effects. Call your doctor for medical advice about side effects. You may report side effects to FDA at 1-800-FDA-1088. Where should I keep my medicine? This drug is given in a hospital or clinic and will not be stored at home. NOTE: This sheet is a summary. It may not cover all possible information. If you have questions about this medicine, talk to your doctor,  pharmacist, or health care provider.  2018 [employer] Standard (2015-06-14 12:08:50) Trastuzumab injection for infusion What is this medicine? TRASTUZUMAB (tras TOO zoo mab) is a monoclonal antibody. It is used to treat breast cancer and stomach cancer. This medicine may be used for other purposes; ask your health care provider or pharmacist if you have questions. COMMON BRAND NAME(S): Herceptin What should I tell my health care provider before I take this medicine? They need to know if you have any of these conditions: -heart disease -heart failure -lung or breathing disease, like asthma -an unusual or allergic reaction to trastuzumab, benzyl alcohol, or other medications, foods, dyes, or preservatives -pregnant or trying to get pregnant -breast-feeding How should I use this medicine? This drug is given as an infusion into a vein. It is administered in a hospital or clinic by a specially trained health care professional. Talk to your pediatrician regarding the use of this medicine in children. This medicine is not approved for use in children. Overdosage: If you think you have taken too much of this medicine contact a poison control center or emergency room at once. NOTE: This medicine is only for you. Do not share this medicine with others. What if I miss a dose? It is important not to miss a dose.  Call your doctor or health care professional if you are unable to keep an appointment. What may interact with this medicine? This medicine may interact with the following medications: -certain types of chemotherapy, such as daunorubicin, doxorubicin, epirubicin, and idarubicin This list may not describe all possible interactions. Give your health care provider a list of all the medicines, herbs, non-prescription drugs, or dietary supplements you use. Also tell them if you smoke, drink alcohol, or use illegal drugs. Some items may interact with your medicine. What should I watch for while  using this medicine? Visit your doctor for checks on your progress. Report any side effects. Continue your course of treatment even though you feel ill unless your doctor tells you to stop. Call your doctor or health care professional for advice if you get a fever, chills or sore throat, or other symptoms of a cold or flu. Do not treat yourself. Try to avoid being around people who are sick. You may experience fever, chills and shaking during your first infusion. These effects are usually mild and can be treated with other medicines. Report any side effects during the infusion to your health care professional. Fever and chills usually do not happen with later infusions. Do not become pregnant while taking this medicine or for 7 months after stopping it. Women should inform their doctor if they wish to become pregnant or think they might be pregnant. Women of child-bearing potential will need to have a negative pregnancy test before starting this medicine. There is a potential for serious side effects to an unborn child. Talk to your health care professional or pharmacist for more information. Do not breast-feed an infant while taking this medicine or for 7 months after stopping it. Women must use effective birth control with this medicine. What side effects may I notice from receiving this medicine? Side effects that you should report to your doctor or health care professional as soon as possible: -allergic reactions like skin rash, itching or hives, swelling of the face, lips, or tongue -chest pain or palpitations -cough -dizziness -feeling faint or lightheaded, falls -fever -general ill feeling or flu-like symptoms -signs of worsening heart failure like breathing problems; swelling in your legs and feet -unusually weak or tired Side effects that usually do not require medical attention (report to your doctor or health care professional if they continue or are bothersome): -bone pain -changes in  taste -diarrhea -joint pain -nausea/vomiting -weight loss This list may not describe all possible side effects. Call your doctor for medical advice about side effects. You may report side effects to FDA at 1-800-FDA-1088. Where should I keep my medicine? This drug is given in a hospital or clinic and will not be stored at home. NOTE: This sheet is a summary. It may not cover all possible information. If you have questions about this medicine, talk to your doctor, pharmacist, or health care provider.  2018 [employer] Standard (2016-05-06 14:37:52) Docetaxel injection What is this medicine? DOCETAXEL (doe se TAX el) is a chemotherapy drug. It targets fast dividing cells, like cancer cells, and causes these cells to die. This medicine is used to treat many types of cancers like breast cancer, certain stomach cancers, head and neck cancer, lung cancer, and prostate cancer. This medicine may be used for other purposes; ask your health care provider or pharmacist if you have questions. COMMON BRAND NAME(S): Docefrez, Taxotere What should I tell my health care provider before I take this medicine? They need to know if you have  any of these conditions: -infection (especially a virus infection such as chickenpox, cold sores, or herpes) -liver disease -low blood counts, like low white cell, platelet, or red cell counts -an unusual or allergic reaction to docetaxel, polysorbate 80, other chemotherapy agents, other medicines, foods, dyes, or preservatives -pregnant or trying to get pregnant -breast-feeding How should I use this medicine? This drug is given as an infusion into a vein. It is administered in a hospital or clinic by a specially trained health care professional. Talk to your pediatrician regarding the use of this medicine in children. Special care may be needed. Overdosage: If you think you have taken too much of this medicine contact a poison control center or emergency room at  once. NOTE: This medicine is only for you. Do not share this medicine with others. What if I miss a dose? It is important not to miss your dose. Call your doctor or health care professional if you are unable to keep an appointment. What may interact with this medicine? -cyclosporine -erythromycin -ketoconazole -medicines to increase blood counts like filgrastim, pegfilgrastim, sargramostim -vaccines Talk to your doctor or health care professional before taking any of these medicines: -acetaminophen -aspirin -ibuprofen -ketoprofen -naproxen This list may not describe all possible interactions. Give your health care provider a list of all the medicines, herbs, non-prescription drugs, or dietary supplements you use. Also tell them if you smoke, drink alcohol, or use illegal drugs. Some items may interact with your medicine. What should I watch for while using this medicine? Your condition will be monitored carefully while you are receiving this medicine. You will need important blood work done while you are taking this medicine. This drug may make you feel generally unwell. This is not uncommon, as chemotherapy can affect healthy cells as well as cancer cells. Report any side effects. Continue your course of treatment even though you feel ill unless your doctor tells you to stop. In some cases, you may be given additional medicines to help with side effects. Follow all directions for their use. Call your doctor or health care professional for advice if you get a fever, chills or sore throat, or other symptoms of a cold or flu. Do not treat yourself. This drug decreases your body's ability to fight infections. Try to avoid being around people who are sick. This medicine may increase your risk to bruise or bleed. Call your doctor or health care professional if you notice any unusual bleeding. This medicine may contain alcohol in the product. You may get drowsy or dizzy. Do not drive, use machinery,  or do anything that needs mental alertness until you know how this medicine affects you. Do not stand or sit up quickly, especially if you are an older patient. This reduces the risk of dizzy or fainting spells. Avoid alcoholic drinks. Do not become pregnant while taking this medicine. Women should inform their doctor if they wish to become pregnant or think they might be pregnant. There is a potential for serious side effects to an unborn child. Talk to your health care professional or pharmacist for more information. Do not breast-feed an infant while taking this medicine. What side effects may I notice from receiving this medicine? Side effects that you should report to your doctor or health care professional as soon as possible: -allergic reactions like skin rash, itching or hives, swelling of the face, lips, or tongue -low blood counts - This drug may decrease the number of white blood cells, red blood cells and  platelets. You may be at increased risk for infections and bleeding. -signs of infection - fever or chills, cough, sore throat, pain or difficulty passing urine -signs of decreased platelets or bleeding - bruising, pinpoint red spots on the skin, black, tarry stools, nosebleeds -signs of decreased red blood cells - unusually weak or tired, fainting spells, lightheadedness -breathing problems -fast or irregular heartbeat -low blood pressure -mouth sores -nausea and vomiting -pain, swelling, redness or irritation at the injection site -pain, tingling, numbness in the hands or feet -swelling of the ankle, feet, hands -weight gain Side effects that usually do not require medical attention (report to your doctor or health care professional if they continue or are bothersome): -bone pain -complete hair loss including hair on your head, underarms, pubic hair, eyebrows, and eyelashes -diarrhea -excessive tearing -changes in the color of fingernails -loosening of the  fingernails -nausea -muscle pain -red flush to skin -sweating -weak or tired This list may not describe all possible side effects. Call your doctor for medical advice about side effects. You may report side effects to FDA at 1-800-FDA-1088. Where should I keep my medicine? This drug is given in a hospital or clinic and will not be stored at home. NOTE: This sheet is a summary. It may not cover all possible information. If you have questions about this medicine, talk to your doctor, pharmacist, or health care provider.  2018 [employer] Standard (2015-06-14 12:32:56)

## 2017-08-21 NOTE — Progress Notes (Signed)
Hematology and Oncology Follow Up Visit  Sophia [name] [id number] [birth year] 27 y.o. 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  She is responded quite nicely.  The mass in the right breast is smaller.  There is less fullness in the right axilla.  She did have a little bit more toxicity with the third cycle of treatment.  I did go ahead and give her a Sancuso patch to try to help with nausea.  She is still working.  She has had no diarrhea.  There is been no rashes.  She is had no leg swelling.  ECOG Performance Status: 1 - Symptomatic but completely ambulatory  Medications:  Allergies as of 08/21/2017   No Known Allergies     Medication List        Accurate as of 08/21/17  8:51 AM. Always use your most recent med list.          acetaminophen 500 MG tablet Commonly known as:  TYLENOL Take 500 mg by mouth every 6 (six) hours as needed.   dexamethasone 4 MG tablet Commonly known as:  DECADRON Take 2 tablets (8 mg total) by mouth 2 (two) times daily. Start the day before Taxotere. Then again the day after chemo for 3 days.   leuprolide 3.75 MG injection Commonly known as:  LUPRON DEPOT (9-MONTH) Inject 3.75 mg into the muscle every 28 (twenty-eight) days.   lidocaine-prilocaine cream Commonly known as:  EMLA Apply to affected area once   LORazepam 0.5 MG tablet Commonly known as:  ATIVAN Take 1 tablet (0.5 mg total) by mouth every 6 (six) hours as needed (Nausea or vomiting).   ondansetron 4 MG disintegrating tablet Commonly known as:  ZOFRAN-ODT Take 4 mg by mouth as needed.   ondansetron 8 MG tablet Commonly known as:  ZOFRAN Take 1 tablet (8 mg  total) by mouth 2 (two) times daily as needed for refractory nausea / vomiting. Start on day 3 after chemo.   oxybutynin 10 MG 24 hr tablet Commonly known as:  DITROPAN XL Take 1 tablet (10 mg total) by mouth at bedtime.   pegfilgrastim 6 MG/0.6ML injection Commonly known as:  NEULASTA Inject 0.6 mLs (6 mg total) into the skin every 21 ( twenty-one) days.   pertuzumab 420 MG/14ML Soln Commonly known as:  PERJETA Inject 28 mLs (840 mg total) into the vein every 21 ( twenty-one) days.   prochlorperazine 10 MG tablet Commonly known as:  COMPAZINE Take 1 tablet (10 mg total) by mouth every 6 (six) hours as needed (Nausea or vomiting).   trastuzumab 150 MG Solr injection Commonly known as:  HERCEPTIN Inject 31 mLs (651 mg total) into the vein every 21 ( twenty-one) days. 60m/kg       Allergies: No Known Allergies  Past Medical History, Surgical history, Social history, and Family History were reviewed and updated.  Review of Systems: Review of Systems  Constitutional: Negative.   HENT: Negative.   Eyes: Negative.   Respiratory: Negative.   Cardiovascular: Negative.   Gastrointestinal: Negative.   Genitourinary: Negative.   Musculoskeletal: Negative.   Skin: Negative.   Neurological: Negative.   Endo/Heme/Allergies: Negative.   Psychiatric/Behavioral: Negative.  Physical Exam:  vitals were not taken for this visit.   Wt Readings from Last 3 Encounters:  08/21/17 177 lb (80.3 kg)  07/31/17 177 lb 4 oz (80.4 kg)  07/10/17 181 lb (82.1 kg)    Physical Exam  Constitutional: She is oriented to person, place, and time.  HENT:  Head: Normocephalic and atraumatic.  Mouth/Throat: Oropharynx is clear and moist.  Eyes: Pupils are equal, round, and reactive to light. EOM are normal.  Neck: Normal range of motion.  Cardiovascular: Normal rate, regular rhythm and normal heart sounds.  Pulmonary/Chest: Effort normal and breath sounds normal.  Abdominal: Soft. Bowel  sounds are normal.  Musculoskeletal: Normal range of motion. She exhibits no edema, tenderness or deformity.  Lymphadenopathy:    She has no cervical adenopathy.  Neurological: She is alert and oriented to person, place, and time.  Skin: Skin is warm and dry. No rash noted. No erythema.  Psychiatric: She has a normal mood and affect. [name]. Judgment and thought content normal.  Vitals reviewed.     Lab Results  Component Value Date   WBC 5.8 08/21/2017   HGB 12.8 06/18/2017   HCT 30.4 (L) 08/21/2017   MCV 93.5 08/21/2017   PLT 89 (L) 08/21/2017   No results found for: FERRITIN, IRON, TIBC, UIBC, IRONPCTSAT Lab Results  Component Value Date   RBC 3.25 (L) 08/21/2017   No results found for: KPAFRELGTCHN, LAMBDASER, KAPLAMBRATIO No results found for: IGGSERUM, IGA, IGMSERUM No results found for: Ronnald Ramp, A1GS, A2GS, Tillman Sers, SPEI   Chemistry      Component Value Date/Time   NA 140 08/21/2017 0815   K 3.7 08/21/2017 0815   CL 106 08/21/2017 0815   CO2 29 08/21/2017 0815   BUN 12 08/21/2017 0815   CREATININE 0.90 08/21/2017 0815      Component Value Date/Time   CALCIUM 9.0 08/21/2017 0815   ALKPHOS 73 08/21/2017 0815   AST 23 08/21/2017 0815   ALT 27 08/21/2017 0815   BILITOT 0.5 08/21/2017 0815      Impression and Plan: Sophia [name] is a very pleasant 27 yo premenopausal caucasian female with locally advanced stage III ductal carcinoma of the right breast, HER-2 positive and ER negative. She is BRCA positive.   At this point, she will complete her neoadjuvant chemotherapy.  We will go ahead and follow-up with a MRI in about 3 weeks.  Hopefully, we will see a nice response.  As far as surgery goes, she wants to have a double mastectomy.  I definitely understand this.  She also needs to have her ovaries taken out.  Hopefully, she can have both procedures done the same time.  She is having hot flashes.  I will try  her on oxybutynin to see if this can help.  We will try her on 10 mg at nighttime.  Hopefully, we will not need any adjuvant radiation therapy.  However, I suspect that we probably will need to have adjuvant radiation given the size of the tumor initially and the fact that there were nodes in the right axilla.  This is very complicated.  She is quite young.  Again she is BRCA positive.  This is a driving factor as far as therapy for her.  I spent about 40 minutes with she and her mom.  I spent over 50% of the time with them face-to-face talking with them about her labs, and are plans for future therapy, including surgery  and radiation and possibly even chemotherapy.    Volanda Napoleon, MD 3/29/20198:51 AM

## 2017-08-21 NOTE — Progress Notes (Signed)
With pltc = 89, will leave carboplatin dose at 750 mg (AUC = 5) per Dr.  Marin Olp.

## 2017-08-21 NOTE — Patient Instructions (Signed)
Implanted Port Home Guide An implanted port is a type of central line that is placed under the skin. Central lines are used to provide IV access when treatment or nutrition needs to be given through a person's veins. Implanted ports are used for long-term IV access. An implanted port may be placed because:  You need IV medicine that would be irritating to the small veins in your hands or arms.  You need long-term IV medicines, such as antibiotics.  You need IV nutrition for a long period.  You need frequent blood draws for lab tests.  You need dialysis.  Implanted ports are usually placed in the chest area, but they can also be placed in the upper arm, the abdomen, or the leg. An implanted port has two main parts:  Reservoir. The reservoir is round and will appear as a small, raised area under your skin. The reservoir is the part where a needle is inserted to give medicines or draw blood.  Catheter. The catheter is a thin, flexible tube that extends from the reservoir. The catheter is placed into a large vein. Medicine that is inserted into the reservoir goes into the catheter and then into the vein.  How will I care for my incision site? Do not get the incision site wet. Bathe or shower as directed by your health care provider. How is my port accessed? Special steps must be taken to access the port:  Before the port is accessed, a numbing cream can be placed on the skin. This helps numb the skin over the port site.  Your health care provider uses a sterile technique to access the port. ? Your health care provider must put on a mask and sterile gloves. ? The skin over your port is cleaned carefully with an antiseptic and allowed to dry. ? The port is gently pinched between sterile gloves, and a needle is inserted into the port.  Only "non-coring" port needles should be used to access the port. Once the port is accessed, a blood return should be checked. This helps ensure that the port  is in the vein and is not clogged.  If your port needs to remain accessed for a constant infusion, a clear (transparent) bandage will be placed over the needle site. The bandage and needle will need to be changed every week, or as directed by your health care provider.  Keep the bandage covering the needle clean and dry. Do not get it wet. Follow your health care provider's instructions on how to take a shower or bath while the port is accessed.  If your port does not need to stay accessed, no bandage is needed over the port.  What is flushing? Flushing helps keep the port from getting clogged. Follow your health care provider's instructions on how and when to flush the port. Ports are usually flushed with saline solution or a medicine called heparin. The need for flushing will depend on how the port is used.  If the port is used for intermittent medicines or blood draws, the port will need to be flushed: ? After medicines have been given. ? After blood has been drawn. ? As part of routine maintenance.  If a constant infusion is running, the port may not need to be flushed.  How long will my port stay implanted? The port can stay in for as long as your health care provider thinks it is needed. When it is time for the port to come out, surgery will be   done to remove it. The procedure is similar to the one performed when the port was put in. When should I seek immediate medical care? When you have an implanted port, you should seek immediate medical care if:  You notice a bad smell coming from the incision site.  You have swelling, redness, or drainage at the incision site.  You have more swelling or pain at the port site or the surrounding area.  You have a fever that is not controlled with medicine.  This information is not intended to replace advice given to you by your health care provider. Make sure you discuss any questions you have with your health care provider. Document  Released: 05/12/2005 Document Revised: 10/18/2015 Document Reviewed: 01/17/2013 Elsevier Interactive Patient Education  2017 Elsevier Inc.  

## 2017-08-21 NOTE — Progress Notes (Signed)
OK to treat with platelets of 89 per Dr Marin Olp. dph

## 2017-08-22 LAB — LUTEINIZING HORMONE: LH: 1.1 m[IU]/mL

## 2017-08-22 LAB — FOLLICLE STIMULATING HORMONE: FSH: 10.5 m[IU]/mL

## 2017-08-28 ENCOUNTER — Inpatient Hospital Stay: Payer: Medicaid Other | Attending: Hematology & Oncology

## 2017-08-28 ENCOUNTER — Other Ambulatory Visit: Payer: Self-pay | Admitting: Family

## 2017-08-28 ENCOUNTER — Other Ambulatory Visit: Payer: Self-pay | Admitting: *Deleted

## 2017-08-28 VITALS — BP 111/75 | HR 96 | Temp 98.5°F | Resp 20

## 2017-08-28 DIAGNOSIS — Z1501 Genetic susceptibility to malignant neoplasm of breast: Secondary | ICD-10-CM | POA: Diagnosis not present

## 2017-08-28 DIAGNOSIS — Z171 Estrogen receptor negative status [ER-]: Secondary | ICD-10-CM | POA: Diagnosis not present

## 2017-08-28 DIAGNOSIS — C50911 Malignant neoplasm of unspecified site of right female breast: Secondary | ICD-10-CM | POA: Diagnosis present

## 2017-08-28 DIAGNOSIS — N951 Menopausal and female climacteric states: Secondary | ICD-10-CM | POA: Diagnosis not present

## 2017-08-28 LAB — ESTRADIOL, ULTRA SENS: Estradiol, Sensitive: 3 pg/mL

## 2017-08-28 MED ORDER — LEUPROLIDE ACETATE 3.75 MG IM KIT
3.7500 mg | PACK | Freq: Once | INTRAMUSCULAR | Status: AC
  Administered 2017-08-28: 3.75 mg via INTRAMUSCULAR
  Filled 2017-08-28: qty 3.75

## 2017-08-28 MED ORDER — METOCLOPRAMIDE HCL 10 MG PO TABS: 10.0000 mg | ORAL_TABLET | ORAL | 1 refills | Status: DC | PRN

## 2017-08-28 NOTE — Progress Notes (Signed)
Pt states that Sancuso patch is falling off and has been ineffective with control of nausea.  Patch remains on pt.'s right arm with tape around edges to hold patch in place.  Pt states that she is unable to take Compazine as ordered d/t it causes her to be lethargic.  Dr. Marin Olp notified and order received for pt to take Reglan 10 mg PO every 4-6 hrs PRN-nausea.  Pt instructed on Reglan and to discontinue Sancuso patch at this time.  Patch removed per pt. Script sent to [place] pharmacy.  Pt verbalized an understanding of Reglan and has no further questions or concerns at time of discharge.

## 2017-09-07 ENCOUNTER — Telehealth: Payer: Self-pay | Admitting: *Deleted

## 2017-09-07 NOTE — Telephone Encounter (Signed)
Received call from Patient asking if radiation is a possibility as Dr. Dalbert Batman said she was most definitely going to need radiation therapy.  She stated it had been mentioned in passing with Dr. Marin Olp but wanted to be more clear on it since she was seeing the plastic surgeon tomorrow and radiation can play a part in this process. Spoke to Dr. Marin Olp who is interested in when she is to get her Breast MRI which is Thursday.  This will tell him a lot.  Based on patients initial tumor size and lymph node involvement, there is a strong possibility of needing radiation.  Patient told this information and understanding of it;

## 2017-09-08 DIAGNOSIS — Z803 Family history of malignant neoplasm of breast: Secondary | ICD-10-CM | POA: Insufficient documentation

## 2017-09-08 DIAGNOSIS — Z1501 Genetic susceptibility to malignant neoplasm of breast: Secondary | ICD-10-CM | POA: Insufficient documentation

## 2017-09-08 DIAGNOSIS — Z1509 Genetic susceptibility to other malignant neoplasm: Secondary | ICD-10-CM

## 2017-09-10 ENCOUNTER — Ambulatory Visit (HOSPITAL_COMMUNITY)
Admission: RE | Admit: 2017-09-10 | Discharge: 2017-09-10 | Disposition: A | Discharge status: Home / Self Care | Payer: Medicaid Other | Source: Ambulatory Visit | Attending: Family | Admitting: Family

## 2017-09-10 DIAGNOSIS — C50911 Malignant neoplasm of unspecified site of right female breast: Secondary | ICD-10-CM | POA: Diagnosis not present

## 2017-09-10 DIAGNOSIS — Z171 Estrogen receptor negative status [ER-]: Secondary | ICD-10-CM | POA: Insufficient documentation

## 2017-09-10 MED ORDER — GADOBENATE DIMEGLUMINE 529 MG/ML IV SOLN
20.0000 mL | Freq: Once | INTRAVENOUS | Status: AC | PRN
  Administered 2017-09-10: 17 mL via INTRAVENOUS

## 2017-09-11 ENCOUNTER — Ambulatory Visit: Payer: Self-pay | Admitting: Hematology & Oncology

## 2017-09-11 ENCOUNTER — Ambulatory Visit: Payer: Self-pay

## 2017-09-11 ENCOUNTER — Other Ambulatory Visit: Payer: Self-pay

## 2017-09-14 ENCOUNTER — Other Ambulatory Visit: Payer: Self-pay | Admitting: General Surgery

## 2017-09-16 ENCOUNTER — Other Ambulatory Visit: Payer: Self-pay | Admitting: Family

## 2017-09-16 ENCOUNTER — Telehealth: Payer: Self-pay

## 2017-09-16 DIAGNOSIS — C50911 Malignant neoplasm of unspecified site of right female breast: Secondary | ICD-10-CM

## 2017-09-16 DIAGNOSIS — Z171 Estrogen receptor negative status [ER-]: Principal | ICD-10-CM

## 2017-09-16 NOTE — Telephone Encounter (Signed)
Received call from pt questioning if Dr Marin Olp had placed referral for GYN.   Per Dr Marin Olp, he personally spoke with Dr Lyman Speller. Referral to be placed in Epic today by MD. Patient aware. dph

## 2017-09-21 ENCOUNTER — Telehealth: Payer: Self-pay | Admitting: Obstetrics & Gynecology

## 2017-09-21 NOTE — Telephone Encounter (Signed)
Patient returned call but stated she'll have to call back tomorrow after thinking about scheduling options.

## 2017-09-21 NOTE — Telephone Encounter (Signed)
Called and left a message for patient to call back to schedule a new patient doctor referral appointment with our office to see Dr. Sabra Heck for a possible hysterectomy.

## 2017-09-22 ENCOUNTER — Inpatient Hospital Stay: Payer: Medicaid Other

## 2017-09-22 ENCOUNTER — Inpatient Hospital Stay (HOSPITAL_BASED_OUTPATIENT_CLINIC_OR_DEPARTMENT_OTHER): Payer: Medicaid Other | Admitting: Hematology & Oncology

## 2017-09-22 VITALS — BP 125/74 | HR 84 | Temp 98.0°F | Resp 16 | Wt 179.0 lb

## 2017-09-22 DIAGNOSIS — C50911 Malignant neoplasm of unspecified site of right female breast: Secondary | ICD-10-CM | POA: Diagnosis not present

## 2017-09-22 DIAGNOSIS — Z1501 Genetic susceptibility to malignant neoplasm of breast: Secondary | ICD-10-CM

## 2017-09-22 DIAGNOSIS — Z95828 Presence of other vascular implants and grafts: Secondary | ICD-10-CM

## 2017-09-22 DIAGNOSIS — Z171 Estrogen receptor negative status [ER-]: Principal | ICD-10-CM

## 2017-09-22 DIAGNOSIS — N951 Menopausal and female climacteric states: Secondary | ICD-10-CM

## 2017-09-22 LAB — CBC WITH DIFFERENTIAL (CANCER CENTER ONLY)
Basophils Absolute: 0 10*3/uL (ref 0.0–0.1)
Basophils Relative: 0 %
EOS PCT: 1 %
Eosinophils Absolute: 0.1 10*3/uL (ref 0.0–0.5)
HCT: 34.5 % — ABNORMAL LOW (ref 34.8–46.6)
Hemoglobin: 11.3 g/dL — ABNORMAL LOW (ref 11.6–15.9)
LYMPHS ABS: 1.8 10*3/uL (ref 0.9–3.3)
Lymphocytes Relative: 35 %
MCH: 32.1 pg (ref 26.0–34.0)
MCHC: 32.8 g/dL (ref 32.0–36.0)
MCV: 98 fL (ref 81.0–101.0)
MONO ABS: 0.6 10*3/uL (ref 0.1–0.9)
Monocytes Relative: 11 %
Neutro Abs: 2.6 10*3/uL (ref 1.5–6.5)
Neutrophils Relative %: 53 %
PLATELETS: 282 10*3/uL (ref 145–400)
RBC: 3.52 MIL/uL — AB (ref 3.70–5.32)
RDW: 17.2 % — ABNORMAL HIGH (ref 11.1–15.7)
WBC Count: 5.1 10*3/uL (ref 3.9–10.0)

## 2017-09-22 LAB — CMP (CANCER CENTER ONLY)
ALT: 26 U/L (ref 10–47)
ANION GAP: 8 (ref 5–15)
AST: 23 U/L (ref 11–38)
Albumin: 3.4 g/dL — ABNORMAL LOW (ref 3.5–5.0)
Alkaline Phosphatase: 75 U/L (ref 26–84)
BILIRUBIN TOTAL: 0.6 mg/dL (ref 0.2–1.6)
BUN: 9 mg/dL (ref 7–22)
CO2: 28 mmol/L (ref 18–33)
Calcium: 10 mg/dL (ref 8.0–10.3)
Chloride: 105 mmol/L (ref 98–108)
Creatinine: 0.6 mg/dL (ref 0.60–1.20)
Glucose, Bld: 112 mg/dL (ref 73–118)
POTASSIUM: 3.4 mmol/L (ref 3.3–4.7)
Sodium: 141 mmol/L (ref 128–145)
TOTAL PROTEIN: 7.8 g/dL (ref 6.4–8.1)

## 2017-09-22 LAB — LACTATE DEHYDROGENASE: LDH: 210 U/L (ref 125–245)

## 2017-09-22 MED ORDER — SODIUM CHLORIDE 0.9% FLUSH
10.0000 mL | INTRAVENOUS | Status: DC | PRN
  Administered 2017-09-22: 10 mL via INTRAVENOUS
  Filled 2017-09-22: qty 10

## 2017-09-22 MED ORDER — HEPARIN SOD (PORK) LOCK FLUSH 100 UNIT/ML IV SOLN
500.0000 [IU] | Freq: Once | INTRAVENOUS | Status: AC
  Administered 2017-09-22: 500 [IU] via INTRAVENOUS
  Filled 2017-09-22: qty 5

## 2017-09-22 MED ORDER — MAGIC MOUTHWASH W/LIDOCAINE: 15.0000 mL | Freq: Four times a day (QID) | ORAL | 1 refills | Status: DC

## 2017-09-22 NOTE — Progress Notes (Signed)
Hematology and Oncology Follow Up Visit  Sophia [name] [id number] [birth year] 26 y.o. 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  She has completed all of her neoadjuvant chemotherapy.  She really has done quite well.  We now are in the phase of surgical intervention.  She has seen the breast surgeon in the reconstructive surgeon.  Unfortunately, it sounds like she will not need to have reconstructive surgery as she likely will need radiation therapy.  We did do a breast MRI on her.  This was done on April 18.  The breast MRI did show a nice response.  The mass in the right breast measures 5 x 3.3 x 6.4 cm.  In the right axilla, the lymph nodes measure 1.7 cm.  She is to undergo bilateral mastectomy.  She also needs to have her ovaries removed.  I have spoken to her gynecologist.  Hopefully, we can coordinate everything so she can have all the surgery done at one visit.  I am not sure if this truly will be able to happen.  She has some mouth sores.  She gets these on occasion.  I will call in some Magic mouthwash for her to see if this can help.  She is still working.  She did have a nice Easter.  I do need to get a echocardiogram on her to make sure that her cardiac function is doing well.  I think 1 of the issues we have is her lack of insurance.  This is hopefully not going to be a problem with respect to her having necessary surgery.  Because she is having hot flashes from the ovarian ablation by Lupron.  Her last estradiol level was 3.0.  Overall, I would say that her performance status is ECOG 0.  Medications:  Allergies as of 09/22/2017   No Known Allergies     Medication List        Accurate as of  09/22/17  9:02 AM. Always use your most recent med list.          leuprolide 3.75 MG injection Commonly known as:  LUPRON DEPOT (12-MONTH) Inject 3.75 mg into the muscle every 28 (twenty-eight) days.   lidocaine-prilocaine cream Commonly known as:  EMLA Apply to affected area once   trastuzumab 150 MG Solr injection Commonly known as:  HERCEPTIN Inject 31 mLs (651 mg total) into the vein every 21 ( twenty-one) days. '8mg'$ /kg       Allergies: No Known Allergies  Past Medical History, Surgical history, Social history, and Family History were reviewed and updated.  Review of Systems: Review of Systems  Constitutional: Negative.   HENT: Negative.   Eyes: Negative.   Respiratory: Negative.   Cardiovascular: Negative.   Gastrointestinal: Negative.   Genitourinary: Negative.   Musculoskeletal: Negative.   Skin: Negative.   Neurological: Negative.   Endo/Heme/Allergies: Negative.   Psychiatric/Behavioral: Negative.      Physical Exam:  weight is 179 lb (81.2 kg). Her oral temperature is 98 F (36.7 C). Her blood pressure is 125/74 and her pulse is 84. Her respiration is 16 and oxygen saturation is 100%.   Wt Readings from Last 3 Encounters:  09/22/17 179 lb (81.2 kg)  08/21/17 177 lb (80.3 kg)  07/31/17  177 lb 4 oz (80.4 kg)    Physical Exam  Constitutional: She is oriented to person, place, and time.  HENT:  Head: Normocephalic and atraumatic.  Mouth/Throat: Oropharynx is clear and moist.  Eyes: Pupils are equal, round, and reactive to light. EOM are normal.  Neck: Normal range of motion.  Cardiovascular: Normal rate, regular rhythm and normal heart sounds.  Pulmonary/Chest: Effort normal and breath sounds normal.  Abdominal: Soft. Bowel sounds are normal.  Musculoskeletal: Normal range of motion. She exhibits no edema, tenderness or deformity.  Lymphadenopathy:    She has no cervical adenopathy.  Neurological: She is alert and oriented to person, place, and time.   Skin: Skin is warm and dry. No rash noted. No erythema.  Psychiatric: She has a normal mood and affect. [name]. Judgment and thought content normal.  Vitals reviewed.     Lab Results  Component Value Date   WBC 5.1 09/22/2017   HGB 11.3 (L) 09/22/2017   HCT 34.5 (L) 09/22/2017   MCV 98.0 09/22/2017   PLT 282 09/22/2017   No results found for: FERRITIN, IRON, TIBC, UIBC, IRONPCTSAT Lab Results  Component Value Date   RBC 3.52 (L) 09/22/2017   No results found for: KPAFRELGTCHN, LAMBDASER, KAPLAMBRATIO No results found for: IGGSERUM, IGA, IGMSERUM No results found for: Odetta Pink, SPEI   Chemistry      Component Value Date/Time   NA 141 09/22/2017 0835   K 3.4 09/22/2017 0835   CL 105 09/22/2017 0835   CO2 28 09/22/2017 0835   BUN 9 09/22/2017 0835   CREATININE 0.60 09/22/2017 0835      Component Value Date/Time   CALCIUM 10.0 09/22/2017 0835   ALKPHOS 75 09/22/2017 0835   AST 23 09/22/2017 0835   ALT 26 09/22/2017 0835   BILITOT 0.6 09/22/2017 0835      Impression and Plan: Sophia [name] is a very pleasant 27 yo premenopausal caucasian female with locally advanced stage III ductal carcinoma of the right breast, HER-2 positive and ER negative. She is BRCA positive.   As far as adjuvant therapy, this really can be dictated by her surgical findings.  By the MRI, I feel that she probably is going to have residual disease.  If so, a recent phase 3 trial showed that there was a defined benefit for Kadcyla in the adjuvant setting versus Herceptin.  I would use this.  I suspect she probably will need radiation therapy.  We really have to be aggressive with her.  She is so young.  I want to make sure that we do all that we need to do so that she will not have a recurrence.  Surgery apparently is planned for May 17.  I would like to see her back after Memorial Day at which point she should be healing  nicely.  This is very complicated.  She is quite young.  Again she is BRCA positive.  This is a driving factor as far as therapy for her.  I spent about 40 minutes with she and her mom.  I spent over 50% of the time with them face-to-face talking with them about her labs, and are plans for future therapy, including surgery and radiation and possibly even chemotherapy.    Volanda Napoleon, MD 4/30/20199:02 AM

## 2017-09-23 LAB — FOLLICLE STIMULATING HORMONE: FSH: 13.4 m[IU]/mL

## 2017-09-23 LAB — LUTEINIZING HORMONE: LH: 0.7 m[IU]/mL

## 2017-09-23 NOTE — Telephone Encounter (Signed)
Return call to [facility] at Dr Antonieta Pert office. Advised we had contacted patient earlier this week and offered appointment for Tuesday, 09-22-17 which patient declined. Advised patient was considering options and would call back.   Jamie requests Dr Sabra Heck call Dr Marin Olp directly upon return to office.

## 2017-09-23 NOTE — Telephone Encounter (Signed)
Dr. Antonieta Pert nurse called requesting to speak with Dr. Sabra Heck. Informed that Dr. Sabra Heck will be back in the office tomorrow and that I would pass along the message to our triage nurse.

## 2017-09-23 NOTE — Telephone Encounter (Signed)
Called and spoke with patient to check in with her about her scheduling needs. She said she is getting ready to have surgery on 10/09/17 and it will be a long surgery. She said she'll have to have a hysterectomy separately and that Dr. Marin Olp is aware of all of this. She said she'll check back in with me when she is ready to schedule with our office to see Dr. Sabra Heck.

## 2017-09-24 ENCOUNTER — Encounter: Payer: Self-pay | Admitting: Obstetrics & Gynecology

## 2017-09-24 NOTE — Telephone Encounter (Signed)
Routing to [employer] to see if pt will schedule.  Thank you.

## 2017-09-24 NOTE — Telephone Encounter (Signed)
Return call from patient. Appointment scheduled for 09-28-17 with Dr Sabra Heck.  Encounter closed.

## 2017-09-24 NOTE — Telephone Encounter (Signed)
Patient returning Sally's call. °

## 2017-09-24 NOTE — Telephone Encounter (Signed)
Call to patient. Left message to call back.  

## 2017-09-26 LAB — ESTRADIOL, ULTRA SENS

## 2017-09-28 ENCOUNTER — Encounter: Payer: Self-pay | Admitting: Obstetrics & Gynecology

## 2017-09-28 ENCOUNTER — Ambulatory Visit (INDEPENDENT_AMBULATORY_CARE_PROVIDER_SITE_OTHER): Payer: Self-pay | Admitting: Obstetrics & Gynecology

## 2017-09-28 ENCOUNTER — Other Ambulatory Visit: Payer: Self-pay

## 2017-09-28 VITALS — BP 110/76 | HR 100 | Resp 16 | Ht 68.0 in | Wt 179.0 lb

## 2017-09-28 DIAGNOSIS — Z1501 Genetic susceptibility to malignant neoplasm of breast: Secondary | ICD-10-CM

## 2017-09-28 DIAGNOSIS — Z1509 Genetic susceptibility to other malignant neoplasm: Secondary | ICD-10-CM

## 2017-09-28 NOTE — Progress Notes (Addendum)
GYNECOLOGY  VISIT  CC:   Discussion of BSO  HPI: 27 y.o. [id number] Single Caucasian female here as new patient in referral from Dr. Marin Olp to consider laparoscopic BSO.  Pt was diagnosed with ER-/PR-/Her-2+ invasive ductal carcinoma of the breast, right, on 05/20/17 after having a breast lump evaluated that had been present for several months.  She initially underwent an MRI and PET scan showing a 3.3 x 8.0 x 8.0cm mass in the right breast (with no evidence of skin or pectoralis invasion).  Several small right internal mammary chain lymph nodes were noted as well, likely metastatic, measuring 48m or less.  Also right axillary lymph nodes were noted, largest measuring 2.9cm.  She was started on chemotherapy--Taxotere/Carbopaltin/Herceptin/Perjeta as well as having ovarian ablation with Lupron.  Four cycles were completed.  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  Repeat MRI was performed 09/10/17 showing good response to treatment with right breast mass measuring 5 x 3.3 x 6.4cm and right axillary node decreasing in size to 1.7cm.  Now that she has completed her chemotherapy, she is going to proceed with bilateral mastectomy and spacer placement.  Then depending on pathology she will either be treated with herceptin and/or radiation.   She does have two children and is desirous of ovary removal if this is possible to do with her upcoming surgery.  Does not desire future child bearing.  Even though she is not currently in a relationship, she is sure of this.  She is clearly aware that a laparoscopic BSO will make her surgically sterile.  She has been on Depo Lupron and is having hot flashes and dealing well with them.  As a result, she understands what she will be experiencing with oophorectomy.  Reviewed increased risks of CVD and bone health with ovary removal.  Bone density testing will be needed  earlier than is typical.    Procedure discussed with patient.  Hospital stay, recovery and pain management all discussed.  Risks discussed including but not limited to bleeding, <1% risk of receiving a  transfusion, infection, 1-2% risk of bowel/bladder/ureteral/vascular injury discussed as well as possible need for additional surgery if injury does occur discussed.  DVT/PE and rare risk of death discussed.  My actual complications with prior surgeries discussed.  Hernia formation discussed.  Incision locations discussed.  Patient aware if pathology abnormal she may need additional treatment.  All questions answered.    Mother accompanies her today.  GYNECOLOGIC HISTORY: Patient's last menstrual period was 06/14/2017. Contraception: abstinence  Menopausal hormone therapy: none  Patient Active Problem List   Diagnosis Date Noted  . Family history of breast cancer 09/08/2017  . BRCA1 positive 09/08/2017  . Genetic testing 06/26/2017  . Stage III breast cancer in female ([place] 06/11/2017  . Counseling regarding goals of care 06/11/2017  . Syphilis complicating pregnancy [id number]   Past Medical History:  Diagnosis Date  . BRCA1 positive   . Family history of breast cancer   . History of chemotherapy    finished 08/21/2017  . Mouth ulcers 10/02/2017  . Stage II infiltrating ductal breast carcinoma, [place]-, right [place] 06/11/[place]    Past Surgical History:  Procedure Laterality Date  . IR FLUORO GUIDE PORT INSERTION RIGHT  06/18/2017  . IR [place] V[place] ACC[place] RIGHT  06/18/2017    MEDS:   Current Outpatient Medications on File Prior to Visit  Medication Sig Dispense Refill  .  leuprolide (LUPRON DEPOT, 28-MONTH,) 3.75 MG injection Inject 3.75 mg into the muscle every 28 (twenty-eight) days. 1 each 11  . lidocaine-prilocaine (EMLA) cream Apply to affected area once 30 g 3  . magic mouthwash w/lidocaine SOLN Take 15 mLs by mouth 4 (four) times daily. 300 mL 1  . trastuzumab (HERCEPTIN) 150  MG SOLR injection Inject 31 mLs (651 mg total) into the vein every 21 ( twenty-one) days. 66m/kg 31 mL 6   No current facility-administered medications on file prior to visit.     ALLERGIES: Patient has no known allergies.  Family History  Problem Relation Age of Onset  . Breast cancer Maternal Grandmother 60       metastatic  . Breast cancer Other     SH:  Single, non smoker  Review of Systems  All other systems reviewed and are negative.   PHYSICAL EXAMINATION:    BP 110/76 (BP Location: Right Arm, Patient Position: Sitting, Cuff Size: Normal)   Pulse 100   Resp 16   Ht _0  (1.727 m)   Wt 179 lb (81.2 kg)   LMP 06/14/2017   BMI 27.22 kg/m     General appearance: alert, cooperative and appears stated age Neck: no adenopathy, supple, symmetrical, trachea midline and thyroid normal to inspection and palpation CV:  Regular rate and rhythm Lungs:  clear to auscultation, no wheezes, rales or rhonchi, symmetric air entry Abdomen: soft, non-tender; bowel sounds normal; no masses,  no organomegaly  Pelvic:Not performed  Chaperone was present for exam.  Assessment: Stage III locally advances invasive ductal carcinoma of the right breast, Er-/Pr-, Her2+ On Lupron for ovarian suppression currently BRCA1 positive, desirous of ovary removal  Plan: Will see if can plan laparoscopic BSO with upcoming mastectomy and spacer placement that is scheduled for 10/09/17 with Dr IDalbert Batmanand TIran Planas   Will have pt sign release for most recent pap smear and ultrasound from Dr[facility] PPosey Prontoat [facility]   ~30 minutes spent with patient >50% of time was in face to face discussion of above.

## 2017-09-30 NOTE — H&P (Signed)
Subjective:     Patient ID: Sophia [name] is a 27 y.o. female.  HPI  Here for follow up discussion breast reconstruction. Presented with palpable right breast mass. Per records, MMG showed right breast mass measuring up to 3.7 cm with associated indeterminate masses, suspicious right axillary adenopathy. Per notes, had three biopsies including 10 o clock, 1 o clock and axillary LN- all with IDC, ER/PR-, Her2 +. MRI demonstrated right breast mass occupying most of the central breast encompassing all 4 quadrants measuring 3.3 x 8.0 x 8.0 cm. Mass and enhancement extends to the nipple base. Extensive bulky right axillary adenopathy noted . PET with slight uptake IM nodes.  Completed neoadjuvant chemotherapy 3.29.19. Continues on Herceptin. Final MRI enhancement in the right breast measures approximately 5.0 x 3.3 x 6.4 cm. Axillary lymph nodes are defined, with normal axillary fat separating the individual lymph nodes, the largest individual lymph node in the right axilla measures approximately 1.7 cm. Right IM LN have decreased in size, with 2 lymph nodes visible currently, each 3 mm.  Mother and MGM with breast ca. Genetics with BRCA1 mutation. Plan right MRM and left prophylactic mastectomy. Patient is interested in autologous reconstruction ultimately. She however would like to have something done at mastectomy, does not want to be completely flat.  Current 36 A. Wt stable. Desires C cup. 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 .      Objective:   Physical Exam  Cardiovascular: Normal rate, regular rhythm and normal heart sounds.   Pulmonary/Chest: Effort normal and breath sounds normal.  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  Reviewed one option to delay reconstruction. In this setting a delayed DIEP flap would address both her desires for autologous and the radiation. Reviewed radiation to flap will cause it to become firmer and lose volume and would not recommend immediate flap reconstruction. Alternative as delayed could pursue LD + TE.   Patient would like to have something in place and does not desire prosthesis. Plan right MRM and left SSM with immediate TE, ADM reconstruction. Reviewed incisions, drains, OR length, hospital stay and post operative limitations. Discussed process of expansion and implant based risks including rupture, surveillance for silicone implants, infection requiring surgery or removal, contracture. Reviewed SSM vs NSM, nothwill be asensate and not stimulate. Reviewed with risks mastectomy flap necrosis requiring additional surgery.  Discussed use of acellular dermis in reconstruction, cadaveric source, incorporation over several weeks, risk that if has seroma or infection can act as additional nidus for infection if not incorporated.  Discussed prepectoral vs sub pectoral reconstruction. Discussed with patient and benefit of this is no animation deformity, may be less pain. Risk may be more visible rippling over upper poles, greater need of ADM. Reviewed pre pectoral would require larger amount acellular dermis, more drains. Discussed any type reconstruction also risks long term displacement implant and visible rippling. If prepectoral counseled I would recommend she be comfortable with silicone implants as more options that  have less rippling. She agrees to prepectoral placement.  Reviewed reconstruction will be asensate and not stimulate. Reviewed additional risks including but not limited to risks mastectomy  flap necrosis requiring additional surgery, seroma, hematoma, asymmetry, need to additional procedures, fat necrosis, DVT/PE, damage to adjacent structures, cardiopulmonary complications.  We discussed specifically that future reconstruction may be delayed until she is insured. Reviewed will not have any further reconstruction for approximately 6 months post completion XRT.   [name], MD [facility] [phone], pin [phone]

## 2017-10-01 ENCOUNTER — Telehealth: Payer: Self-pay | Admitting: Obstetrics & Gynecology

## 2017-10-01 NOTE — Telephone Encounter (Signed)
Patient called to see if Dr. Sabra Heck is planning to do her surgery with Dr. Dalbert Batman 10/09/17. She said she has not heard from our office about this.

## 2017-10-02 ENCOUNTER — Other Ambulatory Visit: Payer: Self-pay

## 2017-10-02 ENCOUNTER — Encounter (HOSPITAL_BASED_OUTPATIENT_CLINIC_OR_DEPARTMENT_OTHER): Payer: Self-pay | Admitting: *Deleted

## 2017-10-02 NOTE — Telephone Encounter (Signed)
Call to patient. Left message to call back.  

## 2017-10-02 NOTE — Telephone Encounter (Signed)
Return call from patient. Update provided on combined surgery case.  Advised that time may not allow for this to be added.

## 2017-10-02 NOTE — Pre-Procedure Instructions (Signed)
To come pick up Ensure pre-surgery drink 10 oz. - to drink by 0400 DOS. 

## 2017-10-02 NOTE — Telephone Encounter (Signed)
Call back from [place] at [facility], their scheduler has confirmed with Dr Gerhard Munch office and there is not enough OR time available to accommodate the addition of Lap BSO.

## 2017-10-02 NOTE — Telephone Encounter (Signed)
Please relay this to the patient. Thank you.

## 2017-10-02 NOTE — Telephone Encounter (Signed)
Patient returned call to Sally. °

## 2017-10-04 NOTE — H&P (Signed)
[name] Location: [facility] Patient #: [id number] DOB: 1990/07/21 Single / Language: Cleophus Molt / Race: White Female      History of Present Illness       This is a very pleasant 27 year old female who returns with her mother to discuss surgical management of her locally advanced right breast cancer, and BRCA1 mutation. Dr. Burney Gauze is her oncologist. Nancee Liter is her PCP. She has now seen Dr. Iran Planas from plastic surgery.      She was initially evaluated in [place] in December 2018 with a large mass in the right breast and bulky right axillary adenopathy. 2 right breast biopsies and the axilla were performed and all 3 showed grade 3 invasive ductal carcinoma, receptor negative, HER-2 positive.  BRCA1 positive. PET scan showed slight uptake in internal mammary nodes but no bony disease was noted. MRI on June 15, 2017 shows large mass occupying most of the central right breast, 8 cm, extending to all 4 quadrants and the nipple base. Extensive bulky right axillary adenopathy was also present. Small right internal mammary chain nodes felt to be suspicious. Left breast and left axilla negative Port was placed by radiology. She has received chemotherapy with partial response MRI performed on September 10, 2017 shows partial response in the right breast with reduction in size from 8 cm to 5.4 cm in greatest dimension. Axillary lymph nodes showed significant response. Left breast and axilla were normal.      She knows that she will need BSO and Dr. Marin Olp is going to arrange gynecologic referral. That has not been accomplished yet. Given the magnitude of the planned upcoming breast surgery that should probably be done at another setting. Most likely outpatient laparoscopic BSO.     Past history is negative otherwise. She is healthy. Family history reveals grandmother has metastatic breast cancer. Dr. Marin Olp takes care of her. This grandmother has 4 aunts and 2  sisters that had breast cancer. No other ovarian or breast cancer. Maternal great great grandmother had colon cancer. Social history she is single but has 2 children and does not want any more children. Denies tobacco. Quit alcohol intake when she started chemotherapy. She is a Chief Executive Officer for [employer]       Initially she was strongly clinically motivated for bilateral mastectomies with autologous tissue transfer. She will certainly need radiation therapy on the right and so I discouraged immediate autologous tissue transfer.   . Dr. Iran Planas also thought that was a bad idea although she could possibly have autologous tissue transfer later. The volume of tumor in her right breast is also large related to the small volume of breast. I think we can achieve skin closure. Possibly get a tissue expander in and that is what she wants to try to do We talked about all options including unilateral versus bilateral mastectomy. Delayed reconstruction of any kind. Immediate reconstruction with tissue expander probably prepectoral. Delayed reconstruction with tissue transfer.     The only option that we ruled out was immediate tissue transfer due to the impending radiation therapy. I talked about skin sparing mastectomy on the left also talked about nipple sparing mastectomy. After he talks to all this what she wants is symmetrical incisions which means skin sparing mastectomy on the left. She is not a candidate for nipple sparing mastectomy on the right. She wants prepectoral implants if that can be accomplished. That will be our plan unless it is too tight on the right. The  port will be left in.      She'll be scheduled for right modified radical mastectomy with tissue expander reconstruction, left prophylactic skin sparing mastectomy with tissue expander reconstruction. Most likely this will be followed by chest wall radiation on the right including hypertension  is. This will be followed by Herceptin chemotherapy for an year. I discussed the indications, details, techniques, and numerous risk of the surgery with the patient and her mother.She agrees with this plan.    Allergies  No Known Drug Allergies  Allergies Reconciled   Medication History  Metoclopramide HCl ('10MG'$  Tablet, Oral) Active. Tylenol ('500MG'$  Capsule, Oral) Active. Dexamethasone ('6MG'$  Tablet, Oral) Active. Leuprolide & Norethindrone (3.75 & '5MG'$  Kit, Combination) Active. Zofran ('8MG'$  Tablet, Oral) Active. Cefprozil ('250MG'$  Tablet, Oral) Active. Megestrol Acetate ('20MG'$  Tablet, Oral) Active. Oxybutynin Chloride ER ('10MG'$  Tablet ER 24HR, Oral) Active. Medications Reconciled  Vitals  Weight: 178.8 lb Height: 68in Body Surface Area: 1.95 m Body Mass Index: 27.19 kg/m  Temp.: 98.37F  Pulse: 101 (Regular)  BP: 130/82 (Sitting, Left Arm, Standard)    Physical Exam General Mental Status-Alert. General Appearance-Not in acute distress. Build & Nutrition-Well nourished. Posture-Normal posture. Gait-Normal. Note: BMI 27   Head and Neck Head-normocephalic, atraumatic with no lesions or palpable masses. Trachea-midline. Thyroid Gland Characteristics - normal size and consistency and no palpable nodules.  Chest and Lung Exam Chest and lung exam reveals -on auscultation, normal breath sounds, no adventitious sounds and normal vocal resonance.  Breast Note: Breasts are small. Skin is healthy. There is still a 6 cm mass in the central right breast, more superiorly than inferiorly. This is mobile. Right axilla reveals minimal thickening certainly not any more bulky adenopathy. Left breast exam reveals no mass skin change or adenopathy. Port noted right infraclavicular area with catheter going up into the right internal jugular vein. No adenopathy in the neck.   Cardiovascular Cardiovascular examination reveals -normal heart sounds,  regular rate and rhythm with no murmurs and femoral artery auscultation bilaterally reveals normal pulses, no bruits, no thrills.  Abdomen Inspection Inspection of the abdomen reveals - No Hernias. Palpation/Percussion Palpation and Percussion of the abdomen reveal - Soft, Non Tender, No Rigidity (guarding), No hepatosplenomegaly and No Palpable abdominal masses.  Neurologic Neurologic evaluation reveals -alert and oriented x 3 with no impairment of recent or remote memory, normal attention span and ability to concentrate, normal sensation and normal coordination.  Musculoskeletal Normal Exam - Bilateral-Upper Extremity Strength Normal and Lower Extremity Strength Normal.    Assessment & Plan  CANCER OF OVERLAPPING SITES OF RIGHT FEMALE BREAST (C50.811)  We have reviewed your history of breast cancer treatment to date we have discussed the anatomy of her breast cancer You have had a partial response to the chemotherapy Your tumor in the right breast is still fairly large compared to the total volume of your right breast You are BRCA1 positive  You have discussed reconstructive options with Dr. Iran Planas from plastic surgery Since you will almost certainly receive radiation therapy to the right chest wall, immediate reconstruction with TRAM or DIEP Flaps is a poor option. Autologous tissue transfer can be considered later.   We have discussed numerous options and techniques for surgery. We have discussed indications, techniques, and risks in detail with you and your mother  We have decided to proceed with right modified radical mastectomy, left skin sparing prophylactic mastectomy, and immediate tissue expander reconstruction The Port-A-Cath will be left in since you will continue to receive Herceptin chemotherapy in  the future Skin closure may be a little bit tight on the right, and Dr. Iran Planas will decide what is the best option at the time of surgery  BRCA POSITIVE  (Z15.01)  FAMILY HISTORY OF BREAST CANCER (Z80.3)    ,hmis

## 2017-10-05 ENCOUNTER — Encounter: Payer: Self-pay | Admitting: Obstetrics & Gynecology

## 2017-10-05 NOTE — Telephone Encounter (Signed)
Call to patient. Advised unable to add Lap BSO to already scheduled mastectomy on Friday, 10-09-17.   Patient requesting to proceed with plans for surgery as soon as possible following mastectomy, Advised this is often six weeks from initial procedure. Will need to review with both surgeons, Dr Marin Olp for clearance.  Advised will review with Dr Sabra Heck for next steps. Wished patient well for Friday and she will keep me updated.   Routing to provider for final review. Patient agreeable to disposition. Will close encounter.

## 2017-10-05 NOTE — Telephone Encounter (Signed)
Returning a call to Sally. °

## 2017-10-05 NOTE — Telephone Encounter (Signed)
Call to patient. Left message to call back.  

## 2017-10-08 NOTE — Progress Notes (Signed)
Ensure pre surgery drink given with instructions to complete by 0400 dos,surgical soap given with instructions,  pt verbalized understanding. 

## 2017-10-09 ENCOUNTER — Ambulatory Visit (HOSPITAL_BASED_OUTPATIENT_CLINIC_OR_DEPARTMENT_OTHER): Payer: Medicaid Other | Admitting: Anesthesiology

## 2017-10-09 ENCOUNTER — Ambulatory Visit (HOSPITAL_BASED_OUTPATIENT_CLINIC_OR_DEPARTMENT_OTHER)
Admission: RE | Admit: 2017-10-09 | Discharge: 2017-10-10 | Disposition: A | Discharge status: Home / Self Care | Payer: Medicaid Other | Source: Ambulatory Visit | Attending: General Surgery | Admitting: General Surgery

## 2017-10-09 ENCOUNTER — Encounter (HOSPITAL_BASED_OUTPATIENT_CLINIC_OR_DEPARTMENT_OTHER)
Admission: RE | Disposition: A | Discharge status: Home / Self Care | Payer: Self-pay | Source: Ambulatory Visit | Attending: General Surgery

## 2017-10-09 ENCOUNTER — Other Ambulatory Visit: Payer: Self-pay

## 2017-10-09 ENCOUNTER — Encounter (HOSPITAL_BASED_OUTPATIENT_CLINIC_OR_DEPARTMENT_OTHER): Payer: Self-pay | Admitting: *Deleted

## 2017-10-09 DIAGNOSIS — C50811 Malignant neoplasm of overlapping sites of right female breast: Secondary | ICD-10-CM | POA: Insufficient documentation

## 2017-10-09 DIAGNOSIS — Z803 Family history of malignant neoplasm of breast: Secondary | ICD-10-CM | POA: Insufficient documentation

## 2017-10-09 DIAGNOSIS — C773 Secondary and unspecified malignant neoplasm of axilla and upper limb lymph nodes: Secondary | ICD-10-CM | POA: Insufficient documentation

## 2017-10-09 DIAGNOSIS — C50911 Malignant neoplasm of unspecified site of right female breast: Secondary | ICD-10-CM | POA: Diagnosis present

## 2017-10-09 DIAGNOSIS — Z171 Estrogen receptor negative status [ER-]: Secondary | ICD-10-CM | POA: Diagnosis not present

## 2017-10-09 HISTORY — PX: TOTAL MASTECTOMY: SHX6129

## 2017-10-09 HISTORY — PX: BREAST RECONSTRUCTION WITH PLACEMENT OF TISSUE EXPANDER AND FLEX HD (ACELLULAR HYDRATED DERMIS): SHX6295

## 2017-10-09 HISTORY — DX: Personal history of antineoplastic chemotherapy: Z92.21

## 2017-10-09 HISTORY — PX: MASTECTOMY MODIFIED RADICAL: SHX5962

## 2017-10-09 SURGERY — MASTECTOMY, MODIFIED RADICAL
Anesthesia: General | Site: Breast | Laterality: Right

## 2017-10-09 MED ORDER — MEPERIDINE HCL 25 MG/ML IJ SOLN: 6.2500 mg | INTRAMUSCULAR | Status: DC | PRN

## 2017-10-09 MED ORDER — LIDOCAINE HCL (CARDIAC) PF 100 MG/5ML IV SOSY
PREFILLED_SYRINGE | INTRAVENOUS | Status: AC
  Filled 2017-10-09: qty 5

## 2017-10-09 MED ORDER — DEXTROSE 5 % IV SOLN
INTRAVENOUS | Status: DC | PRN
  Administered 2017-10-09: 25 ug/min via INTRAVENOUS

## 2017-10-09 MED ORDER — KETOROLAC TROMETHAMINE 30 MG/ML IJ SOLN
30.0000 mg | Freq: Three times a day (TID) | INTRAMUSCULAR | Status: AC
  Administered 2017-10-09 – 2017-10-10 (×3): 30 mg via INTRAVENOUS
  Filled 2017-10-09 (×3): qty 1

## 2017-10-09 MED ORDER — CEFAZOLIN SODIUM-DEXTROSE 1-4 GM/50ML-% IV SOLN
1.0000 g | Freq: Three times a day (TID) | INTRAVENOUS | Status: AC
  Administered 2017-10-09 – 2017-10-10 (×3): 1 g via INTRAVENOUS
  Filled 2017-10-09 (×3): qty 50

## 2017-10-09 MED ORDER — CELECOXIB 200 MG PO CAPS
200.0000 mg | ORAL_CAPSULE | ORAL | Status: AC
  Administered 2017-10-09: 200 mg via ORAL

## 2017-10-09 MED ORDER — METHOCARBAMOL 500 MG PO TABS: 500.0000 mg | ORAL_TABLET | Freq: Three times a day (TID) | ORAL | 0 refills | Status: DC | PRN

## 2017-10-09 MED ORDER — ONDANSETRON HCL 4 MG/2ML IJ SOLN
INTRAMUSCULAR | Status: DC | PRN
  Administered 2017-10-09: 4 mg via INTRAVENOUS

## 2017-10-09 MED ORDER — 0.9 % SODIUM CHLORIDE (POUR BTL) OPTIME
TOPICAL | Status: DC | PRN
  Administered 2017-10-09: 1000 mL

## 2017-10-09 MED ORDER — GABAPENTIN 300 MG PO CAPS
300.0000 mg | ORAL_CAPSULE | Freq: Two times a day (BID) | ORAL | Status: DC
  Administered 2017-10-10: 300 mg via ORAL

## 2017-10-09 MED ORDER — ENOXAPARIN SODIUM 40 MG/0.4ML ~~LOC~~ SOLN
40.0000 mg | SUBCUTANEOUS | Status: DC
  Administered 2017-10-10: 40 mg via SUBCUTANEOUS
  Filled 2017-10-09: qty 0.4

## 2017-10-09 MED ORDER — PROMETHAZINE HCL 25 MG/ML IJ SOLN
6.2500 mg | INTRAMUSCULAR | Status: DC | PRN
  Administered 2017-10-09: 6.25 mg via INTRAVENOUS
  Filled 2017-10-09: qty 1

## 2017-10-09 MED ORDER — MIDAZOLAM HCL 2 MG/2ML IJ SOLN
INTRAMUSCULAR | Status: AC
  Filled 2017-10-09: qty 2

## 2017-10-09 MED ORDER — SUCCINYLCHOLINE CHLORIDE 20 MG/ML IJ SOLN
INTRAMUSCULAR | Status: DC | PRN
  Administered 2017-10-09: 50 mg via INTRAVENOUS

## 2017-10-09 MED ORDER — DIPHENHYDRAMINE HCL 25 MG PO CAPS: 25.0000 mg | ORAL_CAPSULE | Freq: Four times a day (QID) | ORAL | Status: DC | PRN

## 2017-10-09 MED ORDER — PROPOFOL 10 MG/ML IV BOLUS
INTRAVENOUS | Status: DC | PRN
  Administered 2017-10-09: 150 mg via INTRAVENOUS

## 2017-10-09 MED ORDER — SUCCINYLCHOLINE CHLORIDE 200 MG/10ML IV SOSY
PREFILLED_SYRINGE | INTRAVENOUS | Status: AC
  Filled 2017-10-09: qty 10

## 2017-10-09 MED ORDER — HYDROCODONE-ACETAMINOPHEN 5-325 MG PO TABS: 1.0000 | ORAL_TABLET | ORAL | 0 refills | Status: DC | PRN

## 2017-10-09 MED ORDER — PROPOFOL 500 MG/50ML IV EMUL
INTRAVENOUS | Status: AC
  Filled 2017-10-09: qty 50

## 2017-10-09 MED ORDER — SCOPOLAMINE 1 MG/3DAYS TD PT72
MEDICATED_PATCH | TRANSDERMAL | Status: AC
Start: 2017-10-09 — End: ?
  Filled 2017-10-09: qty 1

## 2017-10-09 MED ORDER — DEXAMETHASONE SODIUM PHOSPHATE 4 MG/ML IJ SOLN
INTRAMUSCULAR | Status: DC | PRN
  Administered 2017-10-09: 10 mg via INTRAVENOUS

## 2017-10-09 MED ORDER — HYDROMORPHONE HCL 1 MG/ML IJ SOLN: 0.5000 mg | INTRAMUSCULAR | Status: DC | PRN

## 2017-10-09 MED ORDER — LIDOCAINE HCL (CARDIAC) PF 100 MG/5ML IV SOSY
PREFILLED_SYRINGE | INTRAVENOUS | Status: DC | PRN
  Administered 2017-10-09: 30 mg via INTRAVENOUS

## 2017-10-09 MED ORDER — PROPOFOL 10 MG/ML IV BOLUS
INTRAVENOUS | Status: AC
  Filled 2017-10-09: qty 20

## 2017-10-09 MED ORDER — CHLORHEXIDINE GLUCONATE CLOTH 2 % EX PADS: 6.0000 | MEDICATED_PAD | Freq: Once | CUTANEOUS | Status: DC

## 2017-10-09 MED ORDER — SUGAMMADEX SODIUM 200 MG/2ML IV SOLN
INTRAVENOUS | Status: AC
  Filled 2017-10-09: qty 2

## 2017-10-09 MED ORDER — ONDANSETRON HCL 4 MG/2ML IJ SOLN: 4.0000 mg | Freq: Four times a day (QID) | INTRAMUSCULAR | Status: DC | PRN

## 2017-10-09 MED ORDER — CEFAZOLIN SODIUM-DEXTROSE 2-4 GM/100ML-% IV SOLN
2.0000 g | INTRAVENOUS | Status: AC
  Administered 2017-10-09: 2 g via INTRAVENOUS

## 2017-10-09 MED ORDER — PHENYLEPHRINE HCL 10 MG/ML IJ SOLN
INTRAMUSCULAR | Status: AC
  Filled 2017-10-09: qty 1

## 2017-10-09 MED ORDER — KCL IN DEXTROSE-NACL 20-5-0.9 MEQ/L-%-% IV SOLN
INTRAVENOUS | Status: DC
  Administered 2017-10-09: 13:00:00 via INTRAVENOUS
  Filled 2017-10-09: qty 1000

## 2017-10-09 MED ORDER — HYDROMORPHONE HCL 1 MG/ML IJ SOLN
0.2500 mg | INTRAMUSCULAR | Status: DC | PRN
  Administered 2017-10-09: 0.5 mg via INTRAVENOUS

## 2017-10-09 MED ORDER — ACETAMINOPHEN 500 MG PO TABS
1000.0000 mg | ORAL_TABLET | ORAL | Status: AC
  Administered 2017-10-09: 1000 mg via ORAL

## 2017-10-09 MED ORDER — HYDROCODONE-ACETAMINOPHEN 5-325 MG PO TABS
1.0000 | ORAL_TABLET | ORAL | Status: DC | PRN
  Administered 2017-10-09 – 2017-10-10 (×3): 1 via ORAL
  Filled 2017-10-09 (×3): qty 1

## 2017-10-09 MED ORDER — ROCURONIUM BROMIDE 50 MG/5ML IV SOLN
INTRAVENOUS | Status: AC
  Filled 2017-10-09: qty 1

## 2017-10-09 MED ORDER — MIDAZOLAM HCL 2 MG/2ML IJ SOLN
1.0000 mg | INTRAMUSCULAR | Status: DC | PRN
  Administered 2017-10-09: 2 mg via INTRAVENOUS

## 2017-10-09 MED ORDER — GABAPENTIN 300 MG PO CAPS
300.0000 mg | ORAL_CAPSULE | ORAL | Status: AC
  Administered 2017-10-09: 300 mg via ORAL

## 2017-10-09 MED ORDER — HYDROMORPHONE HCL 1 MG/ML IJ SOLN
INTRAMUSCULAR | Status: AC
  Filled 2017-10-09: qty 0.5

## 2017-10-09 MED ORDER — SCOPOLAMINE 1 MG/3DAYS TD PT72
MEDICATED_PATCH | TRANSDERMAL | Status: DC | PRN
  Administered 2017-10-09: 1 via TRANSDERMAL

## 2017-10-09 MED ORDER — ROCURONIUM BROMIDE 100 MG/10ML IV SOLN: INTRAVENOUS | Status: DC | PRN

## 2017-10-09 MED ORDER — ONDANSETRON HCL 4 MG/2ML IJ SOLN
INTRAMUSCULAR | Status: AC
  Filled 2017-10-09: qty 2

## 2017-10-09 MED ORDER — FENTANYL CITRATE (PF) 100 MCG/2ML IJ SOLN
50.0000 ug | INTRAMUSCULAR | Status: DC | PRN
  Administered 2017-10-09: 100 ug via INTRAVENOUS

## 2017-10-09 MED ORDER — METHOCARBAMOL 500 MG PO TABS
500.0000 mg | ORAL_TABLET | Freq: Three times a day (TID) | ORAL | Status: DC | PRN
Start: 2017-10-09 — End: 2017-10-10

## 2017-10-09 MED ORDER — CELECOXIB 200 MG PO CAPS
ORAL_CAPSULE | ORAL | Status: AC
  Filled 2017-10-09: qty 1

## 2017-10-09 MED ORDER — GABAPENTIN 300 MG PO CAPS
ORAL_CAPSULE | ORAL | Status: AC
  Filled 2017-10-09: qty 1

## 2017-10-09 MED ORDER — ACETAMINOPHEN 500 MG PO TABS
ORAL_TABLET | ORAL | Status: AC
  Filled 2017-10-09: qty 2

## 2017-10-09 MED ORDER — LACTATED RINGERS IV SOLN
INTRAVENOUS | Status: DC
  Administered 2017-10-09 (×3): via INTRAVENOUS

## 2017-10-09 MED ORDER — MIDAZOLAM HCL 5 MG/5ML IJ SOLN
INTRAMUSCULAR | Status: DC | PRN
  Administered 2017-10-09: 2 mg via INTRAVENOUS

## 2017-10-09 MED ORDER — DEXAMETHASONE SODIUM PHOSPHATE 10 MG/ML IJ SOLN
INTRAMUSCULAR | Status: AC
  Filled 2017-10-09: qty 1

## 2017-10-09 MED ORDER — ESMOLOL HCL 100 MG/10ML IV SOLN
INTRAVENOUS | Status: AC
  Filled 2017-10-09: qty 10

## 2017-10-09 MED ORDER — FENTANYL CITRATE (PF) 100 MCG/2ML IJ SOLN
INTRAMUSCULAR | Status: DC | PRN
  Administered 2017-10-09: 50 ug via INTRAVENOUS
  Administered 2017-10-09: 25 ug via INTRAVENOUS
  Administered 2017-10-09: 50 ug via INTRAVENOUS

## 2017-10-09 MED ORDER — DIPHENHYDRAMINE HCL 50 MG/ML IJ SOLN: 25.0000 mg | Freq: Four times a day (QID) | INTRAMUSCULAR | Status: DC | PRN

## 2017-10-09 MED ORDER — ESMOLOL HCL 100 MG/10ML IV SOLN
INTRAVENOUS | Status: DC | PRN
  Administered 2017-10-09 (×2): 10 mg via INTRAVENOUS

## 2017-10-09 MED ORDER — FENTANYL CITRATE (PF) 100 MCG/2ML IJ SOLN
INTRAMUSCULAR | Status: AC
  Filled 2017-10-09: qty 2

## 2017-10-09 MED ORDER — ONDANSETRON 4 MG PO TBDP: 4.0000 mg | ORAL_TABLET | Freq: Four times a day (QID) | ORAL | Status: DC | PRN

## 2017-10-09 MED ORDER — MIDAZOLAM HCL 2 MG/2ML IJ SOLN
INTRAMUSCULAR | Status: AC
Start: 2017-10-09 — End: ?
  Filled 2017-10-09: qty 2

## 2017-10-09 MED ORDER — CEFAZOLIN SODIUM-DEXTROSE 2-4 GM/100ML-% IV SOLN
INTRAVENOUS | Status: AC
  Filled 2017-10-09: qty 100

## 2017-10-09 MED ORDER — SODIUM CHLORIDE 0.9 % IV SOLN
INTRAVENOUS | Status: DC | PRN
  Administered 2017-10-09: 1000 mL

## 2017-10-09 MED ORDER — SULFAMETHOXAZOLE-TRIMETHOPRIM 800-160 MG PO TABS: 1.0000 | ORAL_TABLET | Freq: Two times a day (BID) | ORAL | 0 refills | Status: DC

## 2017-10-09 MED ORDER — SCOPOLAMINE 1 MG/3DAYS TD PT72: 1.0000 | MEDICATED_PATCH | Freq: Once | TRANSDERMAL | Status: DC | PRN

## 2017-10-09 MED ORDER — GABAPENTIN 300 MG PO CAPS
ORAL_CAPSULE | ORAL | Status: AC
Start: 2017-10-09 — End: ?
  Filled 2017-10-09: qty 1

## 2017-10-09 MED ORDER — PHENYLEPHRINE HCL 10 MG/ML IJ SOLN
INTRAMUSCULAR | Status: DC | PRN
  Administered 2017-10-09 (×4): 80 ug via INTRAVENOUS

## 2017-10-09 SURGICAL SUPPLY — 86 items
ALLODERM 12X20 THICK (Tissue) ×10 IMPLANT
APPLIER CLIP 11 MED OPEN (CLIP)
BAG DECANTER FOR FLEXI CONT (MISCELLANEOUS) ×5 IMPLANT
BINDER BREAST XLRG (GAUZE/BANDAGES/DRESSINGS) ×2 IMPLANT
BLADE CLIPPER SURG (BLADE) IMPLANT
BLADE HEX COATED 2.75 (ELECTRODE) ×5 IMPLANT
BLADE SURG 10 STRL SS (BLADE) ×10 IMPLANT
BLADE SURG 15 STRL LF DISP TIS (BLADE) ×3 IMPLANT
BLADE SURG 15 STRL SS (BLADE) ×2
BNDG GAUZE ELAST 4 BULKY (GAUZE/BANDAGES/DRESSINGS) ×10 IMPLANT
CANISTER SUCT 1200ML W/VALVE (MISCELLANEOUS) ×10 IMPLANT
CHLORAPREP W/TINT 26ML (MISCELLANEOUS) ×10 IMPLANT
CLIP APPLIE 11 MED OPEN (CLIP) ×3 IMPLANT
COVER BACK TABLE 60X90IN (DRAPES) ×8 IMPLANT
COVER MAYO STAND STRL (DRAPES) ×8 IMPLANT
DECANTER SPIKE VIAL GLASS SM (MISCELLANEOUS) IMPLANT
DERMABOND ADVANCED (GAUZE/BANDAGES/DRESSINGS) ×4
DERMABOND ADVANCED .7 DNX12 (GAUZE/BANDAGES/DRESSINGS) IMPLANT
DRAIN CHANNEL 15F RND FF W/TCR (WOUND CARE) ×7 IMPLANT
DRAIN CHANNEL 19F RND (DRAIN) ×12 IMPLANT
DRAPE TOP ARMCOVERS (MISCELLANEOUS) ×5 IMPLANT
DRAPE U-SHAPE 76X120 STRL (DRAPES) ×5 IMPLANT
DRAPE UTILITY XL STRL (DRAPES) ×5 IMPLANT
DRSG PAD ABDOMINAL 8X10 ST (GAUZE/BANDAGES/DRESSINGS) ×13 IMPLANT
DRSG TEGADERM 4X10 (GAUZE/BANDAGES/DRESSINGS) ×4 IMPLANT
ELECT BLADE 4.0 EZ CLEAN MEGAD (MISCELLANEOUS) ×5
ELECT COATED BLADE 2.86 ST (ELECTRODE) ×5 IMPLANT
ELECT REM PT RETURN 9FT ADLT (ELECTROSURGICAL) ×10
ELECTRODE BLDE 4.0 EZ CLN MEGD (MISCELLANEOUS) ×3 IMPLANT
ELECTRODE REM PT RTRN 9FT ADLT (ELECTROSURGICAL) ×6 IMPLANT
EVACUATOR SILICONE 100CC (DRAIN) ×19 IMPLANT
EXPANDER BREAST TISSUE 300CC (Breast) ×4 IMPLANT
GAUZE SPONGE 4X4 12PLY STRL LF (GAUZE/BANDAGES/DRESSINGS) IMPLANT
GLOVE BIO SURGEON STRL SZ 6 (GLOVE) ×10 IMPLANT
GLOVE EUDERMIC 7 POWDERFREE (GLOVE) ×5 IMPLANT
GOWN STRL REUS W/ TWL LRG LVL3 (GOWN DISPOSABLE) ×9 IMPLANT
GOWN STRL REUS W/ TWL XL LVL3 (GOWN DISPOSABLE) ×3 IMPLANT
GOWN STRL REUS W/TWL LRG LVL3 (GOWN DISPOSABLE) ×6
GOWN STRL REUS W/TWL XL LVL3 (GOWN DISPOSABLE) ×2
ILLUMINATOR WAVEGUIDE N/F (MISCELLANEOUS) IMPLANT
IV NS 500ML (IV SOLUTION) ×2
IV NS 500ML BAXH (IV SOLUTION) ×3 IMPLANT
KIT FILL SYSTEM UNIVERSAL (SET/KITS/TRAYS/PACK) ×5 IMPLANT
LIGHT WAVEGUIDE WIDE FLAT (MISCELLANEOUS) IMPLANT
MARKER SKIN DUAL TIP RULER LAB (MISCELLANEOUS) IMPLANT
NDL HYPO 25X1 1.5 SAFETY (NEEDLE) ×3 IMPLANT
NEEDLE HYPO 25X1 1.5 SAFETY (NEEDLE) ×5 IMPLANT
NS IRRIG 1000ML POUR BTL (IV SOLUTION) ×5 IMPLANT
PACK BASIN DAY SURGERY FS (CUSTOM PROCEDURE TRAY) ×10 IMPLANT
PENCIL BUTTON HOLSTER BLD 10FT (ELECTRODE) ×10 IMPLANT
PIN SAFETY STERILE (MISCELLANEOUS) ×5 IMPLANT
PUNCH BIOPSY DERMAL 4MM (MISCELLANEOUS) IMPLANT
SHEET MEDIUM DRAPE 40X70 STRL (DRAPES) ×10 IMPLANT
SLEEVE SCD COMPRESS KNEE MED (MISCELLANEOUS) ×5 IMPLANT
SPONGE LAP 18X18 RF (DISPOSABLE) ×15 IMPLANT
SPONGE LAP 4X18 RFD (DISPOSABLE) IMPLANT
STAPLER VISISTAT 35W (STAPLE) ×5 IMPLANT
SUT CHROMIC 4 0 PS 2 18 (SUTURE) ×14 IMPLANT
SUT ETHILON 2 0 FS 18 (SUTURE) ×6 IMPLANT
SUT MNCRL AB 4-0 PS2 18 (SUTURE) ×13 IMPLANT
SUT PDS AB 2-0 CT2 27 (SUTURE) IMPLANT
SUT SILK 2 0 SH (SUTURE) ×2 IMPLANT
SUT VIC AB 0 CT1 27 (SUTURE) ×10
SUT VIC AB 0 CT1 27XBRD ANBCTR (SUTURE) IMPLANT
SUT VIC AB 0 SH 27 (SUTURE) IMPLANT
SUT VIC AB 3-0 54X BRD REEL (SUTURE) IMPLANT
SUT VIC AB 3-0 BRD 54 (SUTURE) ×2
SUT VIC AB 3-0 SH 27 (SUTURE) ×4
SUT VIC AB 3-0 SH 27X BRD (SUTURE) ×3 IMPLANT
SUT VICRYL 0 CT-2 (SUTURE) IMPLANT
SUT VICRYL 3-0 CR8 SH (SUTURE) ×2 IMPLANT
SUT VICRYL 4-0 PS2 18IN ABS (SUTURE) ×7 IMPLANT
SUT VLOC 180 0 24IN GS25 (SUTURE) ×4 IMPLANT
SYR 10ML LL (SYRINGE) ×10 IMPLANT
SYR BULB IRRIGATION 50ML (SYRINGE) ×5 IMPLANT
SYR CONTROL 10ML LL (SYRINGE) IMPLANT
TAPE CLOTH SURG 4X10 WHT LF (GAUZE/BANDAGES/DRESSINGS) ×5 IMPLANT
TAPE MEASURE VINYL STERILE (MISCELLANEOUS) ×5 IMPLANT
TISSUE ALLDRM 12X20 THICK (Tissue) IMPLANT
TOWEL OR 17X24 6PK STRL BLUE (TOWEL DISPOSABLE) ×20 IMPLANT
TOWEL OR NON WOVEN STRL DISP B (DISPOSABLE) ×5 IMPLANT
TRAY DSU PREP LF (CUSTOM PROCEDURE TRAY) ×5 IMPLANT
TUBE CONNECTING 20'X1/4 (TUBING) ×2
TUBE CONNECTING 20X1/4 (TUBING) ×8 IMPLANT
UNDERPAD 30X30 (UNDERPADS AND DIAPERS) ×10 IMPLANT
YANKAUER SUCT BULB TIP NO VENT (SUCTIONS) ×10 IMPLANT

## 2017-10-09 NOTE — Progress Notes (Signed)
Assisted Dr. Lissa Hoard with right, left, ultrasound guided, pectoralis block. Side rails up, monitors on throughout procedure. See vital signs in flow sheet. Tolerated Procedure well.

## 2017-10-09 NOTE — Discharge Instructions (Signed)
About my Jackson-Pratt Bulb Drain ? ?What is a Jackson-Pratt bulb? ?A Jackson-Pratt is a soft, round device used to collect drainage. It is connected to a long, thin drainage catheter, which is held in place by one or two small stiches near your surgical incision site. When the bulb is squeezed, it forms a vacuum, forcing the drainage to empty into the bulb. ? ?Emptying the Jackson-Pratt bulb- ?To empty the bulb: ?1. Release the plug on the top of the bulb. ?2. Pour the bulb's contents into a measuring container which your nurse will provide. ?3. Record the time emptied and amount of drainage. Empty the drain(s) as often as your     doctor or nurse recommends. ? ?Date                  Time                    Amount (Drain 1)                 Amount (Drain 2) ? ?_____________________________________________________________________ ? ?_____________________________________________________________________ ? ?_____________________________________________________________________ ? ?_____________________________________________________________________ ? ?_____________________________________________________________________ ? ?_____________________________________________________________________ ? ?_____________________________________________________________________ ? ?_____________________________________________________________________ ? ?Squeezing the Jackson-Pratt Bulb- ?To squeeze the bulb: ?1. Make sure the plug at the top of the bulb is open. ?2. Squeeze the bulb tightly in your fist. You will hear air squeezing from the bulb. ?3. Replace the plug while the bulb is squeezed. ?4. Use a safety pin to attach the bulb to your clothing. This will keep the catheter from     pulling at the bulb insertion site. ? ?When to call your doctor- ?Call your doctor if: ?Drain site becomes red, swollen or hot. ?You have a fever greater than 101 degrees F. ?There is oozing at the drain site. ?Drain falls out (apply a guaze bandage  over the drain hole and secure it with tape). ?Drainage increases daily not related to activity patterns. (You will usually have more drainage when you are active than when you are resting.) ?Drainage has a bad odor. ? ? ?Post Anesthesia Home Care Instructions ? ?Activity: ?Get plenty of rest for the remainder of the day. A responsible individual must stay with you for 24 hours following the procedure.  ?For the next 24 hours, DO NOT: ?-Drive a car ?-Operate machinery ?-Drink alcoholic beverages ?-Take any medication unless instructed by your physician ?-Make any legal decisions or sign important papers. ? ?Meals: ?Start with liquid foods such as gelatin or soup. Progress to regular foods as tolerated. Avoid greasy, spicy, heavy foods. If nausea and/or vomiting occur, drink only clear liquids until the nausea and/or vomiting subsides. Call your physician if vomiting continues. ? ?Special Instructions/Symptoms: ?Your throat may feel dry or sore from the anesthesia or the breathing tube placed in your throat during surgery. If this causes discomfort, gargle with warm salt water. The discomfort should disappear within 24 hours. ? ?If you had a scopolamine patch placed behind your ear for the management of post- operative nausea and/or vomiting: ? ?1. The medication in the patch is effective for 72 hours, after which it should be removed.  Wrap patch in a tissue and discard in the trash. Wash hands thoroughly with soap and water. ?2. You may remove the patch earlier than 72 hours if you experience unpleasant side effects which may include dry mouth, dizziness or visual disturbances. ?3. Avoid touching the patch. Wash your hands with soap and water after contact with the patch. ? ? ?    ?  patch. °  ° ° °

## 2017-10-09 NOTE — Op Note (Signed)
Operative Note   DATE OF OPERATION: 5.17.19  LOCATION: [facility]-observation  SURGICAL DIVISION: Plastic Surgery  PREOPERATIVE DIAGNOSES:  1. Right breast cancer overlapping sites metastatic to lymph nodes, ER- 2. BRCA1 3. Neoadjuvant chemotherapy  POSTOPERATIVE DIAGNOSES:  same  PROCEDURE:  1. Bilateral breast reconstruction with tissue expanders 2. Acellular dermis (Alloderm) for breast reconstruction 480 cm2  SURGEON: [name] MD MBA  ASSISTANT: S. Rayburn PA-C  ANESTHESIA:  General.   EBL: 300 ml for entire procedure  COMPLICATIONS: None immediate.   INDICATIONS FOR PROCEDURE:  The patient, Sophia [name], is a 27 y.o. female born on 07-06-1990, is here for immediate prepectoral expander, ADM breast reconstruction following right MRM and left prophylactic mastectomies.    FINDINGS: Natrelle [id number]-[id number] 300 ml tissue expanders placed bilateral, initial fill volume RIGHT 200 ml LEFT 225 ml. RIGHT SN [id number] LEFT SN [id number]  DESCRIPTION OF PROCEDURE:  Following induction of general anesthesia, Foley catheter placed. The patient's operative site was prepped and draped in a sterile fashion. A time out was performed and all information was confirmed to be correct. Following completion of mastectomies, reconstruction began on left side.  The cavity was irrigated with solution containing Ancef, gentamicin, and bacitracin. Hemostasis was ensured. Laterally the mastectomy flap over posterior axillary line was advanced anteriorly and the subcutaneous tissue and superficial fascia was secured to pectoralis muscle and serratus muscle with 0-vicryl. A 19 Fr drain was placed in subcutaneous position laterally and a 15 Fr drain placed along inframammary fold. Each secured to skin with 2-0 nylon. Cavity irrigated with Betadine. The tissue expanders were prepared on back table prior in insertion. The expander was filled with air to 225 ml. Perforated acellular dermis was draped  over anterior surface expander. The ADM was then secured to itself over posterior surface of expander with 4-0 chromic suture. Redundant folds acellular dermis excised so that the ADM lied flat without folds over air filled expander.The expander was secured to fascia over lateral sternal borderwith a 0 vicryl. Thelateral tab was also secured to pectoralis muscle with 0-vicryl. The ADM was secured to pectoralis muscle and chest wall along inferior border at inframammary fold with 0 V-lock suture. Skin closure completedwith 3-0 vicryl in fascial layer and 4-0 vicryl in dermis. Skin closure completed with 4-0 monocryl subcuticular and tissue adhesive.  I then directed my attention to right chest where similar irrigation and drain placement completed. Laterally the mastectomy flap over posterior axillary line was advanced anteriorly and the subcutaneous tissue and superficial fascia was secured to pectoralis muscle and serratus muscle with 0-vicryl. The axillary drain was thus isolated from remainder breast cavity. The prepared expander with ADM secured over anterior surface was placed in right chest and tabs secured to chest wall and pectoralis muscle with 0- vicryl suture.The expander on right was filled with 200 ml air. The acellular dermis at inframammary fold was secured to chest wall with 0 V-lock suture. Skin closure completedwith 3-0 vicryl in fascial layer and 4-0 vicryl in dermis. Skin closure completed with 4-0 monocryl subcuticular and tissue adhesive. Tegaderm dressings applied followed by breast binder.  The patient was allowed to wake from anesthesia, extubated and taken to the recovery room in satisfactory condition.   SPECIMENS: none  DRAINS: 15 and 19 Fr JP in right and left breast reconstruction  [name], MD [facility]astic [facility] Reconstructive Surgery [phone], pin [phone]

## 2017-10-09 NOTE — Interval H&P Note (Signed)
History and Physical Interval Note:  10/09/2017 7:00 AM  [name]  has presented today for surgery, with the diagnosis of RIGHT BREAST CANCER  The various methods of treatment have been discussed with the patient and family. After consideration of risks, benefits and other options for treatment, the patient has consented to  Bilateral breast reconstruction with tissue expanders, acellular dermis as a surgical intervention .  The patient's history has been reviewed, patient examined, no change in status, stable for surgery.  I have reviewed the patient's chart and labs.  Questions were answered to the patient's satisfaction.     Sophia [name]

## 2017-10-09 NOTE — Interval H&P Note (Signed)
History and Physical Interval Note:  10/09/2017 6:33 AM  Sophia [name]  has presented today for surgery, with the diagnosis of RIGHT BREAST CANCER  The various methods of treatment have been discussed with the patient and family. After consideration of risks, benefits and other options for treatment, the patient has consented to  Procedure(s): MASTECTOMY MODIFIED RADICAL (Right) LEFT PROPHYLATIC MASTECTOMY (Left) BILATERAL BREAST RECONSTRUCTION WITH PLACEMENT OF TISSUE EXPANDER AND FLEX HD (ACELLULAR HYDRATED DERMIS) (Bilateral) as a surgical intervention .  The patient's history has been reviewed, patient examined, no change in status, stable for surgery.  I have reviewed the patient's chart and labs.  Questions were answered to the patient's satisfaction.     Adin Hector

## 2017-10-09 NOTE — Anesthesia Procedure Notes (Signed)
Anesthesia Regional Block: Pectoralis block   Pre-Anesthetic Checklist: ,, timeout performed, Correct Patient, Correct Site, Correct Laterality, Correct Procedure, Correct Position, site marked, Risks and benefits discussed,  Surgical consent,  Pre-op evaluation,  At surgeon's request and post-op pain management  Laterality: Left and Right  Prep: chloraprep       Needles:   Needle Type: Stimiplex     Needle Length: 9cm      Additional Needles:   Procedures:,,,, ultrasound used (permanent image in chart),,,,  Narrative:  Start time: 10/09/2017 7:18 AM End time: 10/09/2017 7:24 AM Injection made incrementally with aspirations every 5 mL.  Performed by: Personally  Anesthesiologist: Nolon Nations, MD  Additional Notes: Patient tolerated well. Good fascial spread noted.

## 2017-10-09 NOTE — Anesthesia Preprocedure Evaluation (Signed)
Anesthesia Evaluation  Patient identified by MRN, date of birth, ID band Patient awake    Reviewed: Allergy & Precautions, NPO status , Patient's Chart, lab work & pertinent test results  Airway Mallampati: II  TM Distance: >3 FB Neck ROM: Full    Dental no notable dental hx.    Pulmonary neg pulmonary ROS,    Pulmonary exam normal breath sounds clear to auscultation       Cardiovascular negative cardio ROS Normal cardiovascular exam Rhythm:Regular Rate:Normal     Neuro/Psych negative neurological ROS  negative psych ROS   GI/Hepatic negative GI ROS, Neg liver ROS,   Endo/Other  negative endocrine ROS  Renal/GU negative Renal ROS     Musculoskeletal negative musculoskeletal ROS (+)   Abdominal   Peds  Hematology negative hematology ROS (+)   Anesthesia Other Findings   Reproductive/Obstetrics negative OB ROS                             Anesthesia Physical Anesthesia Plan  ASA: I  Anesthesia Plan: General   Post-op Pain Management:    Induction: Intravenous  PONV Risk Score and Plan: 4 or greater and Ondansetron, Dexamethasone, Midazolam and Scopolamine patch - Pre-op  Airway Management Planned: Oral ETT  Additional Equipment:   Intra-op Plan:   Post-operative Plan: Extubation in OR  Informed Consent: I have reviewed the patients History and Physical, chart, labs and discussed the procedure including the risks, benefits and alternatives for the proposed anesthesia with the patient or authorized representative who has indicated his/her understanding and acceptance.   Dental advisory given  Plan Discussed with: CRNA  Anesthesia Plan Comments:         Anesthesia Quick Evaluation

## 2017-10-09 NOTE — Anesthesia Postprocedure Evaluation (Signed)
Anesthesia Post Note  Patient: Sophia [name]  Procedure(s) Performed: MASTECTOMY MODIFIED RADICAL (Right Breast) LEFT PROPHYLATIC MASTECTOMY (Left Breast) BILATERAL BREAST RECONSTRUCTION WITH PLACEMENT OF TISSUE EXPANDER AND FLEX HD (ACELLULAR HYDRATED DERMIS) (Bilateral Breast)     Patient location during evaluation: PACU Anesthesia Type: General Level of consciousness: sedated and patient cooperative Pain management: pain level controlled Vital Signs Assessment: post-procedure vital signs reviewed and stable Respiratory status: spontaneous breathing Cardiovascular status: stable Anesthetic complications: no    Last Vitals:  Vitals:   10/09/17 1530 10/09/17 1645  BP:    Pulse: 96 88  Resp:    Temp:    SpO2: 100% 100%    Last Pain:  Vitals:   10/09/17 1645  TempSrc:   PainSc: [place]

## 2017-10-09 NOTE — Op Note (Addendum)
First Assist Op Note: [facility] I assisted the Surgeon(s) __Dr. Arnoldo Hooker Thimmappa___ on the procedure(s): ___bilateral breast reconstruction with bilateral tissue expanders and acellular dermal matrix_____on Date ___5/17/19______  I provided my assistance on this case as follows:  I was present and acted as first Environmental consultant during this operation. I was present during the patient transport into the operative suite and assisted the OR staff with transferring and positioning of the patient. All extremities were checked and properly cushioned and safety straps in place. I was involved in the prepping and placement of sterile drapes. A time out was performed and all information confirmed to be correct.  I first assisted during the case including retraction for exposure, assisting with closure of surgical wounds and application of sterile dressings. I provided assistance with application of post operative garments/splinting and assisted with patient transfer back to the stretcher as needed.   RAYBURN,SHAWN,PA-C Plastic Surgery [phone]

## 2017-10-09 NOTE — Anesthesia Procedure Notes (Signed)
Procedure Name: Intubation Date/Time: 10/09/2017 7:37 AM Performed by: Marrianne Mood, CRNA Pre-anesthesia Checklist: Patient identified, Emergency Drugs available, Suction available, Patient being monitored and Timeout performed Patient Re-evaluated:Patient Re-evaluated prior to induction Oxygen Delivery Method: Circle system utilized Preoxygenation: Pre-oxygenation with 100% oxygen Induction Type: IV induction Ventilation: Mask ventilation without difficulty Laryngoscope Size: Miller and 3 Grade View: Grade II Tube type: Oral Tube size: 7.0 mm Number of attempts: 1 Airway Equipment and Method: Stylet and Oral airway Placement Confirmation: ETT inserted through vocal cords under direct vision,  positive ETCO2 and breath sounds checked- equal and bilateral Secured at: 21 cm Tube secured with: Tape Dental Injury: Teeth and Oropharynx as per pre-operative assessment

## 2017-10-09 NOTE — Transfer of Care (Signed)
Immediate Anesthesia Transfer of Care Note  Patient: Sophia [name]  Procedure(s) Performed: MASTECTOMY MODIFIED RADICAL (Right Breast) LEFT PROPHYLATIC MASTECTOMY (Left Breast) BILATERAL BREAST RECONSTRUCTION WITH PLACEMENT OF TISSUE EXPANDER AND FLEX HD (ACELLULAR HYDRATED DERMIS) (Bilateral Breast)  Patient Location: PACU  Anesthesia Type:GA combined with regional for post-op pain  Level of Consciousness: awake and patient cooperative  Airway & Oxygen Therapy: Patient Spontanous Breathing and Patient connected to face mask oxygen  Post-op Assessment: Report given to RN and Post -op Vital signs reviewed and stable  Post vital signs: Reviewed and stable  Last Vitals:  Vitals Value Taken Time  BP    Temp    Pulse 103 10/09/2017 11:59 AM  Resp    SpO2 100 % 10/09/2017 11:59 AM  Vitals shown include unvalidated device data.  Last Pain:  Vitals:   10/09/17 0634  TempSrc: Oral  PainSc: 0-No pain         Complications: No apparent anesthesia complications

## 2017-10-09 NOTE — Op Note (Signed)
Patient Name:           Sophia [name]   Date of Surgery:        10/09/2017  Pre op Diagnosis:      Cancer of overlapping sites right breast                                       BRCA positive                                      Status post neoadjuvant chemotherapy   Post op Diagnosis:   Same  Procedure:                 Left prophylactic total mastectomy, right modified radical mastectomy, immediate reconstruction  Surgeon:                     Edsel Petrin. Dalbert Batman, M.D., FACS  Assistant:                      [name], MD   Indication for Assistant: Complex exposure and dissection due to locally advanced tumor.  Operative Indications:   This is a very pleasant 27 year old female who is brought to the operating room for definitive surgical management of her locally advanced right breast cancer, and BRCA1 mutation. Dr. Burney Gauze is her oncologist. Nancee Liter is her PCP. She has  seen Dr. Iran Planas from plastic surgery.      She was initially evaluated in [place] in December 2018 with a large mass in the right breast and bulky right axillary adenopathy. 2 right breast biopsies and the axilla were performed and all 3 showed grade 3 invasive ductal carcinoma, receptor negative, HER-2 positive.  BRCA1 positive. PET scan showed slight uptake in internal mammary nodes but no bony disease was noted. MRI on June 15, 2017 shows large mass occupying most of the central right breast, 8 cm, extending to all 4 quadrants and the nipple base. Extensive bulky right axillary adenopathy was also present. Small right internal mammary chain nodes felt to be suspicious. Left breast and left axilla negative Port was placed by radiology. She has received chemotherapy with partial response MRI performed on September 10, 2017 shows partial response in the right breast with reduction in size from 8 cm to 5.4 cm in greatest dimension. Axillary lymph nodes showed significant response. Left breast and  axilla were normal.      She knows that she will need BSO and Dr. Marin Olp is going to arrange gynecologic referral.  That surgery is planned later this summer.      Past history is negative otherwise. She is healthy. Family history reveals grandmother has metastatic breast cancer. Dr. Marin Olp takes care of her. This grandmother has 4 aunts and 2 sisters that had breast cancer. No other ovarian or breast cancer. Maternal great great grandmother had colon cancer. Social history she is single but has 2 children and does not want any more children.        Initially she was strongly clinically motivated for bilateral mastectomies with autologous tissue transfer. She will certainly need radiation therapy on the right and so I discouraged immediate autologous tissue transfer.   . Dr. Iran Planas also thought that was a bad idea although she could possibly have autologous tissue  transfer later. The volume of tumor in her right breast is also large related to the small volume of breast. I think we can achieve skin closure. Possibly get a tissue expander in and that is what she wants to try to do We talked about all options including unilateral versus bilateral mastectomy. Delayed reconstruction of any kind. Immediate reconstruction with tissue expander probably prepectoral. Delayed reconstruction with tissue transfer.     The only option that we ruled out was immediate tissue transfer due to the impending radiation therapy. I talked about skin sparing mastectomy on the left also talked about nipple sparing mastectomy. After our talks to all this what she wants is symmetrical incisions which means skin sparing mastectomy on the left. She is not a candidate for nipple sparin I g mastectomy on the right. She wants prepectoral implants if that can be accomplished. That will be our plan unless it is too tight on the right. The port will be left in.      She'll be scheduled for right modified radical  mastectomy with tissue expander reconstruction, left prophylactic skin sparing mastectomy with tissue expander reconstruction. Most likely this will be followed by chest wall radiation on the right including hypertension is. This will be followed by Herceptin chemotherapy for an year. .She agrees with this plan.    Operative Findings:       The left breast looked normal and felt normal.  The right breast had significant tumor palpable in the upper central breast.  It came off of the pectoralis major muscle well.  It is probably close anteriorly.  The skin flaps were very thin and so if there are any positive margins anteriorly the anterior margin will be the skin.  There was a lot of chemotherapy effect on the right side and it made the tissues very thick and inflexible including the axilla.  We performed a level 1 and level 2 lymph node dissection in the right axilla.  We could visualize the thoracodorsal neurovascular bundle in the long thoracic nerve although they did not stimulate very well given the anesthesia.  Procedure in Detail:          Following the induction of general endotracheal anesthesia a Foley catheter was placed.  Intravenous antibiotics were given.  Surgical timeout was performed.  After marking the patient's entire neck chest and upper abdomen were prepped and draped in a sterile fashion.  We drew mirror-image transverse elliptical skin sparing mastectomy incisions.    The left side was done first.  The elliptical incision was made with a knife.  Skin flaps were raised superiorly to the infraclavicular area, laterally to  anterior border of the latissimus dorsi muscle, inferiorly to the anterior rectus sheath and medially to the parasternal area.  The lateral skin margin was marked with a silk suture.  The breast was dissected off of the pectoralis major and minor muscles and sent to the pathology lab with the appropriate history attached.  The wound was irrigated.  Hemostasis was  excellent.  The wound was packed with moistened saline gauze. On the right side the transverse elliptical skin sparing incision was made.  We extended it laterally a little bit to facilitate the axillary dissection.  Skin flaps were raised medially to the parasternal area, inferiorly to the anterior rectus sheath, laterally to latissimus dorsi muscle and superiorly very careful to dissect in the subdermal plane.  Tumor was palpable superiorly but I felt that we had a thin margin.  We  took the dissection all the way up to the infraclavicular area.  The Port-A-Cath was preserved.  We took the dissection laterally.  We mobilized the pectoralis major and minor muscles and incised the clavipectoral fascia entering the axilla.  This was a slow tedious dissection because of thickening of the tissues.  We ultimately got the dissection all the way up to the axillary vein and then we dissected all of the level 1 and level 2 lymph nodes out.  Some venous tributaries and small arteries were controlled with metal clips and divided.  A couple of bleeders were controlled with 3-0 Vicryl suture ligatures.  We then dissected the right breast off of the pectoralis major and pectoralis minor muscles in the breast and the axillary contents were sent as a single specimen.  This wound was irrigated with saline.  Hemostasis was excellent. At this point in the case I scrubbed out and Dr. Iran Planas   assumed control of the operative procedure and will dictate her reconstructive procedure separately.  At this point in the case EBL was 150 cc.  Counts correct.  Complications none.     Edsel Petrin. Dalbert Batman, M.D., FACS General and Minimally Invasive Surgery Breast and Colorectal Surgery  10/09/2017 10:12 AM

## 2017-10-10 DIAGNOSIS — C50811 Malignant neoplasm of overlapping sites of right female breast: Secondary | ICD-10-CM | POA: Diagnosis not present

## 2017-10-10 NOTE — Progress Notes (Signed)
POD#1 right MRM, left mastectomy, bilateral TE/ADM reconstruction  Temp:  [97.2 F (36.2 C)-99 F (37.2 C)] 97.5 F (36.4 C) (05/18 0745) Pulse Rate:  [77-107] 77 (05/18 0745) Resp:  [13-24] 16 (05/18 0745) BP: (96-122)/(60-72) 105/69 (05/18 0745) SpO2:  [90 %-100 %] 99 % (05/18 0745)   JP R 37.5/42.5/47.5 L32.5/35  PE Alert NAD Chest soft tegaderms in place incisions dry intact JPs serosanguinous  A/P Doing well, pain controlled, drain teaching this am F/u arranged, home this am  [name], MD [facility] Plastic & Reconstructive Surgery [phone], pin [phone]

## 2017-10-10 NOTE — Progress Notes (Signed)
Subjective Feeling well; no complaints this morning.  Objective: Vital signs in last 24 hours: Temp:  [97.2 F (36.2 C)-99 F (37.2 C)] 97.5 F (36.4 C) (05/18 0745) Pulse Rate:  [77-107] 77 (05/18 0745) Resp:  [13-24] 16 (05/18 0745) BP: (96-122)/(60-72) 105/69 (05/18 0745) SpO2:  [90 %-100 %] 99 % (05/18 0745)    Intake/Output from previous day: 05/17 0701 - 05/18 0700 In: 4144 [P.O.:1444; I.V.:2600; IV Piggyback:100] Out: 4332 [Urine:1100; Drains:195; Blood:300] Intake/Output this shift: No intake/output data recorded.  Gen: NAD, comfortable CV: RRR Pulm: Normal work of breathing Chest: incisions dressed with tegaderm; soft. JPs with serosanguinous output  Studies/Results:  Anti-infectives: Anti-infectives (From admission, onward)   Start     Dose/Rate Route Frequency Ordered Stop   10/09/17 1400  ceFAZolin (ANCEF) IVPB 1 g/50 mL premix     1 g 100 mL/hr over 30 Minutes Intravenous Every 8 hours 10/09/17 1329 10/10/17 0730   10/09/17 0809  bacitracin 50,000 Units, gentamicin (GARAMYCIN) 80 mg, ceFAZolin (ANCEF) 1 g in sodium chloride 0.9 % 1,000 mL  Status:  Discontinued       As needed 10/09/17 0810 10/09/17 1155   10/09/17 0615  ceFAZolin (ANCEF) IVPB 2g/100 mL premix     2 g 200 mL/hr over 30 Minutes Intravenous On call to O.R. 10/09/17 0610 10/09/17 0758   10/09/17 0000  sulfamethoxazole-trimethoprim (BACTRIM DS,SEPTRA DS) 800-160 MG tablet     1 tablet Oral 2 times daily 10/09/17 1155         Assessment/Plan: Patient Active Problem List   Diagnosis Date Noted  . Breast cancer, right ([place]) 10/09/2017  . Family history of breast cancer 09/08/2017  . BRCA1 positive 09/08/2017  . Genetic testing 06/26/2017  . Stage III breast cancer in female ([place]) 06/11/2017  . Counseling regarding goals of care 06/11/2017  . Syphilis complicating pregnancy 95/18/8416   s/p Procedure(s): MASTECTOMY MODIFIED RADICAL LEFT PROPHYLATIC MASTECTOMY BILATERAL BREAST  RECONSTRUCTION WITH PLACEMENT OF TISSUE EXPANDER AND FLEX HD (ACELLULAR HYDRATED DERMIS) 10/09/2017  -Recovering well; planning discharge home today -Will plan to see back in clinic with Dr. Dalbert Batman in 2-3 weeks   LOS: 0 days   Sharon Mt. Dema Severin, M.D. [facility]

## 2017-10-11 ENCOUNTER — Encounter (HOSPITAL_BASED_OUTPATIENT_CLINIC_OR_DEPARTMENT_OTHER): Payer: Self-pay | Admitting: General Surgery

## 2017-10-12 NOTE — Addendum Note (Signed)
Addendum  created 10/12/17 0845 by Maryella Shivers, CRNA   Charge Capture section accepted

## 2017-10-14 ENCOUNTER — Other Ambulatory Visit: Payer: Self-pay | Admitting: Hematology & Oncology

## 2017-10-14 DIAGNOSIS — C50911 Malignant neoplasm of unspecified site of right female breast: Secondary | ICD-10-CM

## 2017-10-14 DIAGNOSIS — Z171 Estrogen receptor negative status [ER-]: Principal | ICD-10-CM

## 2017-10-14 DIAGNOSIS — Z5111 Encounter for antineoplastic chemotherapy: Secondary | ICD-10-CM

## 2017-10-22 ENCOUNTER — Inpatient Hospital Stay: Payer: Medicaid Other | Attending: Hematology & Oncology

## 2017-10-22 VITALS — BP 111/69 | HR 108 | Temp 98.4°F | Resp 20

## 2017-10-22 DIAGNOSIS — C50911 Malignant neoplasm of unspecified site of right female breast: Secondary | ICD-10-CM | POA: Diagnosis present

## 2017-10-22 DIAGNOSIS — C50919 Malignant neoplasm of unspecified site of unspecified female breast: Secondary | ICD-10-CM

## 2017-10-22 MED ORDER — LEUPROLIDE ACETATE 3.75 MG IM KIT
3.7500 mg | PACK | Freq: Once | INTRAMUSCULAR | Status: AC
  Administered 2017-10-22: 3.75 mg via INTRAMUSCULAR
  Filled 2017-10-22: qty 3.75

## 2017-10-28 ENCOUNTER — Other Ambulatory Visit: Payer: Self-pay

## 2017-10-28 ENCOUNTER — Inpatient Hospital Stay: Payer: Medicaid Other

## 2017-10-28 ENCOUNTER — Inpatient Hospital Stay: Payer: Medicaid Other | Attending: Hematology & Oncology | Admitting: Hematology & Oncology

## 2017-10-28 ENCOUNTER — Encounter: Payer: Self-pay | Admitting: Hematology & Oncology

## 2017-10-28 VITALS — BP 107/64 | HR 75 | Temp 98.2°F | Resp 16 | Wt 181.0 lb

## 2017-10-28 DIAGNOSIS — E611 Iron deficiency: Secondary | ICD-10-CM

## 2017-10-28 DIAGNOSIS — Z5112 Encounter for antineoplastic immunotherapy: Secondary | ICD-10-CM | POA: Diagnosis present

## 2017-10-28 DIAGNOSIS — N951 Menopausal and female climacteric states: Secondary | ICD-10-CM

## 2017-10-28 DIAGNOSIS — C773 Secondary and unspecified malignant neoplasm of axilla and upper limb lymph nodes: Secondary | ICD-10-CM | POA: Diagnosis not present

## 2017-10-28 DIAGNOSIS — C50911 Malignant neoplasm of unspecified site of right female breast: Secondary | ICD-10-CM

## 2017-10-28 DIAGNOSIS — C50919 Malignant neoplasm of unspecified site of unspecified female breast: Secondary | ICD-10-CM

## 2017-10-28 DIAGNOSIS — Z171 Estrogen receptor negative status [ER-]: Principal | ICD-10-CM

## 2017-10-28 DIAGNOSIS — D500 Iron deficiency anemia secondary to blood loss (chronic): Secondary | ICD-10-CM | POA: Insufficient documentation

## 2017-10-28 DIAGNOSIS — Z95828 Presence of other vascular implants and grafts: Secondary | ICD-10-CM

## 2017-10-28 LAB — CBC WITH DIFFERENTIAL (CANCER CENTER ONLY)
Basophils Absolute: 0 10*3/uL (ref 0.0–0.1)
Basophils Relative: 1 %
Eosinophils Absolute: 0.5 10*3/uL (ref 0.0–0.5)
Eosinophils Relative: 8 %
HEMATOCRIT: 32.9 % — AB (ref 34.8–46.6)
HEMOGLOBIN: 10.6 g/dL — AB (ref 11.6–15.9)
LYMPHS ABS: 2 10*3/uL (ref 0.9–3.3)
Lymphocytes Relative: 32 %
MCH: 30.4 pg (ref 26.0–34.0)
MCHC: 32.2 g/dL (ref 32.0–36.0)
MCV: 94.3 fL (ref 81.0–101.0)
MONOS PCT: 9 %
Monocytes Absolute: 0.6 10*3/uL (ref 0.1–0.9)
NEUTROS ABS: 3.2 10*3/uL (ref 1.5–6.5)
NEUTROS PCT: 50 %
Platelet Count: 188 10*3/uL (ref 145–400)
RBC: 3.49 MIL/uL — AB (ref 3.70–5.32)
RDW: 13.9 % (ref 11.1–15.7)
WBC: 6.3 10*3/uL (ref 3.9–10.0)

## 2017-10-28 LAB — CMP (CANCER CENTER ONLY)
ALBUMIN: 3.4 g/dL — AB (ref 3.5–5.0)
ALK PHOS: 59 U/L (ref 26–84)
ALT: 20 U/L (ref 10–47)
AST: 19 U/L (ref 11–38)
Anion gap: 12 (ref 5–15)
BILIRUBIN TOTAL: 0.6 mg/dL (ref 0.2–1.6)
BUN: 12 mg/dL (ref 7–22)
CHLORIDE: 103 mmol/L (ref 98–108)
CO2: 29 mmol/L (ref 18–33)
CREATININE: 0.8 mg/dL (ref 0.60–1.20)
Calcium: 9.7 mg/dL (ref 8.0–10.3)
Glucose, Bld: 95 mg/dL (ref 73–118)
POTASSIUM: 4 mmol/L (ref 3.3–4.7)
Sodium: 144 mmol/L (ref 128–145)
TOTAL PROTEIN: 7.7 g/dL (ref 6.4–8.1)

## 2017-10-28 LAB — IRON AND TIBC
IRON: 36 ug/dL — AB (ref 41–142)
Saturation Ratios: 11 % — ABNORMAL LOW (ref 21–57)
TIBC: 336 ug/dL (ref 236–444)
UIBC: 300 ug/dL

## 2017-10-28 LAB — FERRITIN: FERRITIN: 19 ng/mL (ref 9–269)

## 2017-10-28 MED ORDER — HEPARIN SOD (PORK) LOCK FLUSH 100 UNIT/ML IV SOLN
500.0000 [IU] | Freq: Once | INTRAVENOUS | Status: AC
  Administered 2017-10-28: 500 [IU] via INTRAVENOUS
  Filled 2017-10-28: qty 5

## 2017-10-28 MED ORDER — SODIUM CHLORIDE 0.9% FLUSH
10.0000 mL | INTRAVENOUS | Status: DC | PRN
  Administered 2017-10-28: 10 mL via INTRAVENOUS
  Filled 2017-10-28: qty 10

## 2017-10-28 NOTE — Patient Instructions (Signed)

## 2017-10-28 NOTE — Addendum Note (Signed)
Addended by: Burney Gauze R on: 10/28/2017 05:44 PM   Modules accepted: Orders

## 2017-10-28 NOTE — Progress Notes (Addendum)
Hematology and Oncology Follow Up Visit  Sophia [name] [id number] 12/06/1990 27 y.o. 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  She did undergo bilateral mastectomies.  This was on 10/09/2017.  The pathology report ([id number]) unfortunately showed extensive residual disease.  In the right breast, she had a poorly differentiated 8 cm residual tumor.  She had satellite tumors that measured 1.2 cm and 1.1 cm, respectively.  She had 5 of 8 lymph nodes that were positive.  She had one with extranodal extension.  This is a lot more extensive than I thought by clinical exam.  Her tumor is still estrogen receptor negative and progesterone receptor negative.  It is HER-2 positive.  She had expanders placed.  Her ovaries have not been taken out yet.  This will had to be done at a separate procedure.  She is still sore.  She still has limited range of motion of the right shoulder.  She will be going for some physical therapy.  She has had no problems with bowels or bladder.  She does have hot flashes.  We did do ovarian ablation on her.  She does not wish to have any more children.  Her appetite is doing okay.  She has had no nausea or vomiting.  Overall, I would say that her performance status is ECOG 1.   Medications:  Allergies as of 10/28/2017   No Known Allergies     Medication List        Accurate as of 10/28/17  9:36 AM. Always use your most recent med list.            HYDROcodone-acetaminophen 5-325 MG tablet Commonly known as:  NORCO Take 1 tablet by mouth every 4 (four) hours as needed for moderate pain.   leuprolide 3.75 MG injection Commonly known as:  LUPRON DEPOT (75-MONTH) Inject 3.75 mg into the muscle every 28 (twenty-eight) days.   lidocaine-prilocaine cream Commonly known as:  EMLA Apply to affected area once   magic mouthwash w/lidocaine Soln Take 15 mLs by mouth 4 (four) times daily.   methocarbamol 500 MG tablet Commonly known as:  ROBAXIN Take 1 tablet (500 mg total) by mouth every 8 (eight) hours as needed for muscle spasms.   trastuzumab 150 MG Solr injection Commonly known as:  HERCEPTIN Inject 31 mLs (651 mg total) into the vein every 21 ( twenty-one) days. '8mg'$ /kg       Allergies: No Known Allergies  Past Medical History, Surgical history, Social history, and Family History were reviewed and updated.  Review of Systems: Review of Systems  Constitutional: Negative.   HENT: Negative.   Eyes: Negative.   Respiratory: Negative.   Cardiovascular: Negative.   Gastrointestinal: Negative.   Genitourinary: Negative.   Musculoskeletal: Negative.   Skin: Negative.   Neurological: Negative.   Endo/Heme/Allergies: Negative.   Psychiatric/Behavioral: Negative.      Physical Exam:  weight is 181 lb (82.1 kg). Her oral temperature is 98.2 F (36.8 C). Her blood pressure is 107/64 and her pulse is 75. Her  respiration is 16 and oxygen saturation is 99%.   Wt Readings from Last 3 Encounters:  10/28/17 181 lb (82.1 kg)  10/09/17 180 lb 6.4 oz (81.8 kg)  09/28/17 179 lb (81.2 kg)    Physical Exam  Constitutional: She is oriented to person, place, and time.  HENT:  Head: Normocephalic and atraumatic.  Mouth/Throat: Oropharynx is clear and moist.  Eyes: Pupils are equal, round, and reactive to light. EOM are normal.  Neck: Normal range of motion.  Cardiovascular: Normal rate, regular rhythm and normal heart sounds.   Pulmonary/Chest: Effort normal and breath sounds normal.  Abdominal: Soft. Bowel sounds are normal.  Musculoskeletal: Normal range of motion. She exhibits no edema, tenderness or deformity.  Lymphadenopathy:    She has no cervical adenopathy.  Neurological: She is alert and oriented to person, place, and time.  Skin: Skin is warm and dry. No rash noted. No erythema.  Psychiatric: She has a normal mood and affect. [name]. Judgment and thought content normal.  Vitals reviewed.     Lab Results  Component Value Date   WBC 6.3 10/28/2017   HGB 10.6 (L) 10/28/2017   HCT 32.9 (L) 10/28/2017   MCV 94.3 10/28/2017   PLT 188 10/28/2017   No results found for: FERRITIN, IRON, TIBC, UIBC, IRONPCTSAT Lab Results  Component Value Date   RBC 3.49 (L) 10/28/2017   No results found for: KPAFRELGTCHN, LAMBDASER, KAPLAMBRATIO No results found for: IGGSERUM, IGA, IGMSERUM No results found for: Odetta Pink, SPEI   Chemistry      Component Value Date/Time   NA 144 10/28/2017 0853   K 4.0 10/28/2017 0853   CL 103 10/28/2017 0853   CO2 29 10/28/2017 0853   BUN 12 10/28/2017 0853   CREATININE 0.80 10/28/2017 0853      Component Value Date/Time   CALCIUM 9.7 10/28/2017 0853   ALKPHOS 59 10/28/2017 0853   AST 19 10/28/2017 0853   ALT 20 10/28/2017 0853   BILITOT 0.6 10/28/2017 0853      Impression and Plan: Sophia [name] is a very pleasant 27 yo premenopausal caucasian female with locally advanced stage III ductal carcinoma of the right breast, HER-2 positive and ER negative. She is BRCA positive.   Again, I am very disappointed of the relative lack of response that we are seen.  This definitely increases her risk of recurrence.  She definitely needs aggressive adjuvant therapy.  She will need both adjuvant radiation therapy and adjuvant systemic therapy.  As far as systemic therapy is concerned, a new randomized  clinical trial showed that Kadcyla in the adjuvant setting for patients with residual disease after resection is more effective than Herceptin.  As such, we will utilize his data it for Sophia [name] and use Kadcyla in the adjuvant setting.  After she finishes Kadcyla, she will be placed on Nerlynx which is an oral version of Herceptin.  She is scheduled for an echocardiogram later this week.  I spoke to the radiation oncologist up with the [facility] cancer center.  He will see her in the next week so that he can plan for radiation therapy to the right chest wall and axilla.  She is clearly iron deficient.  Her iron studies today show a ferritin of only 19 with an iron saturation of 11%.  This is quite low.  We will go ahead and give her a dose of IV iron when she comes in for her Kadcyla.  We spent about 45-50 minutes with Sophia [name] and her mom.  We reviewed the pathology report.  We answered all their questions.  We spent over half the time with them.  We will start Kadcyla on November 06, 2017.   we will have her come back in 3 weeks after that for her next cycle.    is a driving factor as far as therapy for her.  I spent about 40 minutes with she and her mom.  I spent over 50% of the time with them face-to-face talking with them about her labs, and are plans for future therapy, including surgery and radiation and possibly even chemotherapy.    Volanda Napoleon, MD 6/5/20199:36 AM

## 2017-10-28 NOTE — Progress Notes (Signed)
DISCONTINUE ON PATHWAY REGIMEN - Breast     A cycle is every 21 days:     Pertuzumab      Pertuzumab      Trastuzumab      Trastuzumab      Carboplatin      Docetaxel   **Always confirm dose/schedule in your pharmacy ordering system**  REASON: Other Reason PRIOR TREATMENT: BOS307: Docetaxel + Carboplatin + Trastuzumab + Pertuzumab (TCHP) q21 Days x 6 Cycles TREATMENT RESPONSE: Partial Response (PR)  START ON PATHWAY REGIMEN - Breast     A cycle is every 21 days:     Ado-trastuzumab emtansine   **Always confirm dose/schedule in your pharmacy ordering system**  Patient Characteristics: Post-Neoadjuvant Therapy and Resection, HER2 Positive, 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

## 2017-10-29 ENCOUNTER — Other Ambulatory Visit: Payer: Self-pay

## 2017-10-29 LAB — LUTEINIZING HORMONE: LH: 20 m[IU]/mL

## 2017-10-29 LAB — FOLLICLE STIMULATING HORMONE: FSH: 25.9 m[IU]/mL

## 2017-10-30 LAB — ESTRADIOL, ULTRA SENS: Estradiol, Sensitive: 3.8 pg/mL

## 2017-11-02 ENCOUNTER — Other Ambulatory Visit: Payer: Self-pay | Admitting: Hematology & Oncology

## 2017-11-02 ENCOUNTER — Other Ambulatory Visit: Payer: Self-pay | Admitting: *Deleted

## 2017-11-02 MED ORDER — ADO-TRASTUZUMAB EMTANSINE CHEMO INJECTION 100 MG: 300.0000 mg | INTRAVENOUS | 6 refills | Status: DC

## 2017-11-03 ENCOUNTER — Other Ambulatory Visit: Payer: Self-pay | Admitting: Family

## 2017-11-05 ENCOUNTER — Other Ambulatory Visit: Payer: Self-pay | Admitting: *Deleted

## 2017-11-05 ENCOUNTER — Other Ambulatory Visit: Payer: Self-pay | Admitting: Family

## 2017-11-05 ENCOUNTER — Ambulatory Visit: Payer: Medicaid Other | Attending: General Surgery | Admitting: Physical Therapy

## 2017-11-05 DIAGNOSIS — M6281 Muscle weakness (generalized): Secondary | ICD-10-CM | POA: Diagnosis present

## 2017-11-05 DIAGNOSIS — M25621 Stiffness of right elbow, not elsewhere classified: Secondary | ICD-10-CM | POA: Diagnosis present

## 2017-11-05 DIAGNOSIS — M79601 Pain in right arm: Secondary | ICD-10-CM

## 2017-11-05 DIAGNOSIS — R293 Abnormal posture: Secondary | ICD-10-CM | POA: Diagnosis present

## 2017-11-05 DIAGNOSIS — Z483 Aftercare following surgery for neoplasm: Secondary | ICD-10-CM | POA: Diagnosis present

## 2017-11-05 DIAGNOSIS — C50919 Malignant neoplasm of unspecified site of unspecified female breast: Secondary | ICD-10-CM

## 2017-11-05 NOTE — Patient Instructions (Signed)

## 2017-11-05 NOTE — Therapy (Signed)
[place], [place], [address] Phone: [phone]   Fax:  [phone]  Physical Therapy Evaluation  Patient Details  Name: Sophia [name] MRN: [id number] Date of Birth: 1991/05/01 Referring Provider: Dr. Dalbert Batman  (Dt. Thimmappa is Psychiatric nurse.  She will have a DIEP flap)   Encounter Date: 11/05/2017  PT End of Session - 11/05/17 1650    Visit Number  1    Number of Visits  9    Date for PT Re-Evaluation  12/05/17    PT Start Time  1350    PT Stop Time  1434    PT Time Calculation (min)  44 min    Activity Tolerance  Patient tolerated treatment well    Behavior During Therapy  WFL for tasks assessed/performed       Past Medical History:  Diagnosis Date  . BRCA1 positive   . Family history of breast cancer   . History of chemotherapy    finished 08/21/2017  . Iron deficiency anemia due to chronic blood loss 10/28/2017  . Mouth ulcers 10/02/2017  . Stage II infiltrating ductal breast carcinoma, ER-, right ([place]) 06/11/2017    Past Surgical History:  Procedure Laterality Date  . BREAST RECONSTRUCTION WITH PLACEMENT OF TISSUE EXPANDER AND FLEX HD (ACELLULAR HYDRATED DERMIS) Bilateral 10/09/2017   Procedure: BILATERAL BREAST RECONSTRUCTION WITH PLACEMENT OF TISSUE EXPANDER AND FLEX HD (ACELLULAR HYDRATED DERMIS);  Surgeon: [name], MD;  Location: [facility];  Service: Plastics;  Laterality: Bilateral;  . IR FLUORO GUIDE PORT INSERTION RIGHT  06/18/2017  . IR US GUIDE VASC ACCESS RIGHT  06/18/2017  . MASTECTOMY MODIFIED RADICAL Right 10/09/2017   Procedure: MASTECTOMY MODIFIED RADICAL;  Surgeon: Fanny Skates, MD;  Location: [facility];  Service: General;  Laterality: Right;  . TOTAL MASTECTOMY Left 10/09/2017   Procedure: LEFT PROPHYLATIC MASTECTOMY;  Surgeon: Fanny Skates, MD;  Location: [facility];  Service: General;  Laterality: Left;    There  were no vitals filed for this visit.   Subjective Assessment - 11/05/17 1401    Subjective  doing pretty well.  having problems sleeping on the her sides     Pertinent History  right sided breast cancer diagnosed in December 2018.  On 10/09/2017 she had right  mastectomy with 12 nodes removed and  left prophylactic mastectomy  with immediate expanders bilaterally.  She had 5 drains.  She had  neoadjuvant chemo and will be having radiation  She will have DEIP flaps after that.     Patient Stated Goals  "I want my arms back" wants to play her kids and go kayaking     Currently in Pain?  -- more stiffness and pressure          [facility] PT Assessment - 11/05/17 0001      Assessment   Medical Diagnosis  right breast cancer     Referring Provider  Dr. Dalbert Batman  Dt. Thimmappa is Psychiatric nurse.  She will have a DIEP flap    Onset Date/Surgical Date  04/25/17    Hand Dominance  Right      Precautions   Precautions  None      Restrictions   Weight Bearing Restrictions  No      Balance Screen   Has the patient fallen in the past 6 months  No    Has the patient had a decrease in activity level because of a fear of falling?  No    Is the patient reluctant to leave their home because of a fear of falling?   No      Home Environment   Living Environment  Private residence    Living Arrangements  Children 28 and 45 years old boys    Available Help at Discharge  Available PRN/intermittently family lives nearby       Prior Function   Level of Independence  Independent    Vocation  -- medical leave     Vocation Requirements  desk work     Leisure  just active with kids, does not go to the gym because she doesn't have time       Cognition   Overall Cognitive Status  Within Functional Limits for tasks assessed      Observation/Other Assessments   Observations  Pt has healing incisions on bilateral chest from recent surgery. She has visible fullness in right scapular area     Skin Integrity   healing incisions    Quick DASH   61.36      Sensation   Light Touch  Not tested      Coordination   Gross Motor Movements are Fluid and Coordinated  No limited by pain moving both arms, especially right arm       Posture/Postural Control   Posture/Postural Control  Postural limitations    Postural Limitations  Rounded Shoulders;Forward head      ROM / Strength   AROM / PROM / Strength  AROM;Strength      AROM   Right Shoulder Flexion  90 Degrees    Right Shoulder ABduction  105 Degrees    Right Shoulder External Rotation  90 Degrees    Left Shoulder Flexion  160 Degrees    Left Shoulder ABduction  165 Degrees    Left Shoulder External Rotation  90 Degrees      Strength   Overall Strength Comments  limted by pain in both shoulder, scapulae appear to move symmetrically     Right Shoulder Flexion  2+/5    Right Shoulder ABduction  2/5    Left Shoulder Flexion  3-/5    Left Shoulder ABduction  3-/5      Palpation   Palpation comment  tender tightness especially in right axilla with pt feeling "pulling" down into upper arm.  Medum sized cording felt in right axilla.  Pt with soft tissue tightness aound trunk below breasta bilaterally         LYMPHEDEMA/ONCOLOGY QUESTIONNAIRE - 11/05/17 1421      Right Upper Extremity Lymphedema   10 cm Proximal to Olecranon Process  30.5 cm    Olecranon Process  26 cm    15 cm Proximal to Ulnar Styloid Process  27 cm    10 cm Proximal to Ulnar Styloid Process  24 cm    Just Proximal to Ulnar Styloid Process  17 cm    Across Hand at [employer]  20 cm    At [place] of 2nd Digit  6.5 cm      Left Upper Extremity Lymphedema   10 cm Proximal to Olecranon Process  20 cm    Olecranon Process  26.5 cm    15 cm Proximal to Ulnar Styloid Process  26.6 cm    10 cm Proximal to Ulnar Styloid Process  24 cm    Just Proximal to Ulnar Styloid Process  17 cm    Across Hand at [employer]  19.4  cm    At [facility] of 2nd Digit  6 cm           Quick Dash - 11/05/17 0001    Open a tight or new jar  Severe difficulty    Do heavy household chores (wash walls, wash floors)  Severe difficulty    Carry a shopping bag or briefcase  Mild difficulty    Wash your back  Moderate difficulty    Use a knife to cut food  Mild difficulty    Recreational activities in which you take some force or impact through your arm, shoulder, or hand (golf, hammering, tennis)  Unable    During the past week, to what extent has your arm, shoulder or hand problem interfered with your normal social activities with family, friends, neighbors, or groups?  Extremely    During the past week, to what extent has your arm, shoulder or hand problem limited your work or other regular daily activities  Quite a bit    Arm, shoulder, or hand pain.  Moderate    Tingling (pins and needles) in your arm, shoulder, or hand  Moderate    Difficulty Sleeping  Moderate difficulty    DASH Score  61.36 %        Objective measurements completed on examination: See above findings.      [facility] Adult PT Treatment/Exercise - 11/05/17 0001      Exercises   Exercises  Shoulder      Shoulder Exercises: Supine   Other Supine Exercises  supine dowel rod flexion and abduction     Other Supine Exercises  instructed to place elbow on a pillow and stretch into abduction to get hand behind head for radiation position                   PT Long Term Goals - 11/05/17 1700      PT LONG TERM GOAL #1   Title  pt will be independent in lymphedem risk reduction practices     Time  4    Period  Weeks    Status  New      PT LONG TERM GOAL #2   Title  Pt will have 140 degrees of right shoulder abduction so that she can easily get into radiation position     Time  4    Period  Weeks    Status  New      PT LONG TERM GOAL #3   Title  Pt will be independent in  home exercise program     Time  4    Period  Weeks    Status  New      PT LONG TERM GOAL #4   Title  Pt  will decrease Quick DASH score to < 30 demonstrating a functional improvment in her UE     Baseline  61.36    Time  4    Period  Weeks    Status  New             Plan - 11/05/17 1650    Clinical Impression Statement  27 yo female with bilateral mastectomy who is have pain with decreased shoulder range of motion and strength, especailly on the right side.  She may have some cording in right axilla as she feels alot of tightness in this area.  She needs to improve her range of motion to be able to receive radiation.  She is at risk of lymphedema with 12  nodes removed from the right so will  need eduation , instruction in Strength ABC program after radiation is complete and prophylactic compression     History and Personal Factors relevant to plan of care:  neoadjuvant chemo, lives along way from our clinic,  single mom with 2 small boys     Clinical Presentation  Evolving    Clinical Presentation due to:  will have radiation     Clinical Decision Making  Moderate    Rehab Potential  Good    Clinical Impairments Affecting Rehab Potential  previous chemo, has expanders and will have fills     PT Frequency  2x / week    PT Duration  4 weeks    PT Treatment/Interventions  ADLs/Self Care Home Management;DME Instruction;Orthotic Fit/Training;Manual techniques;Passive range of motion;Manual lymph drainage;Taping;Therapeutic exercise    PT Next Visit Plan  Manul lymph drainage to right back and axilla, then PROM , manual techniques for cording and  stretching to right arm to get to radition position. progress HEP and exercise. try to scheule around ABC class.  May need to suspend PT during radiation aslong as pt continues to stretch and then teach Strength ABC program, help with compression sleeve     PT Home Exercise Plan  supine dowel rod exercise     Consulted and Agree with Plan of Care  Patient       Patient will benefit from skilled therapeutic intervention in order to improve the  following deficits and impairments:  Decreased knowledge of use of DME, Increased fascial restricitons, Pain, Decreased scar mobility, Increased muscle spasms, Postural dysfunction, Decreased activity tolerance, Decreased range of motion, Decreased strength, Impaired perceived functional ability, Decreased knowledge of precautions, Increased edema  Visit Diagnosis: Aftercare following surgery for neoplasm - Plan: PT plan of care cert/re-cert  Stiffness of right upper arm joint - Plan: PT plan of care cert/re-cert  Pain in right arm - Plan: PT plan of care cert/re-cert  Muscle weakness (generalized) - Plan: PT plan of care cert/re-cert  Abnormal posture - Plan: PT plan of care cert/re-cert     Problem List Patient Active Problem List   Diagnosis Date Noted  . Iron deficiency anemia due to chronic blood loss 10/28/2017  . Breast cancer, right ([place]) 10/09/2017  . Family history of breast cancer 09/08/2017  . BRCA1 positive 09/08/2017  . Genetic testing 06/26/2017  . Stage III breast cancer in female ([place]) 06/11/2017  . Counseling regarding goals of care 06/11/2017  . Syphilis complicating pregnancy 22/24/1146   Donato Heinz. Owens Shark PT  Norwood Levo 11/05/2017, 5:04 PM  [place], [place], [address] Phone: [phone]   Fax:  [phone]  Name: Aleatha Taite MRN: [id number] Date of Birth: 03/08/1991

## 2017-11-06 ENCOUNTER — Inpatient Hospital Stay: Payer: Medicaid Other

## 2017-11-06 ENCOUNTER — Ambulatory Visit: Payer: Self-pay | Admitting: Family

## 2017-11-06 ENCOUNTER — Other Ambulatory Visit: Payer: Self-pay | Admitting: *Deleted

## 2017-11-06 VITALS — BP 102/65 | HR 86 | Temp 98.1°F | Resp 20 | Wt 185.0 lb

## 2017-11-06 DIAGNOSIS — Z171 Estrogen receptor negative status [ER-]: Secondary | ICD-10-CM

## 2017-11-06 DIAGNOSIS — C50011 Malignant neoplasm of nipple and areola, right female breast: Secondary | ICD-10-CM

## 2017-11-06 DIAGNOSIS — C50919 Malignant neoplasm of unspecified site of unspecified female breast: Secondary | ICD-10-CM

## 2017-11-06 DIAGNOSIS — Z5112 Encounter for antineoplastic immunotherapy: Secondary | ICD-10-CM | POA: Diagnosis not present

## 2017-11-06 LAB — CMP (CANCER CENTER ONLY)
ALT: 17 U/L (ref 10–47)
ANION GAP: 12 (ref 5–15)
AST: 23 U/L (ref 11–38)
Albumin: 3.3 g/dL — ABNORMAL LOW (ref 3.5–5.0)
Alkaline Phosphatase: 76 U/L (ref 26–84)
BILIRUBIN TOTAL: 0.6 mg/dL (ref 0.2–1.6)
BUN: 11 mg/dL (ref 7–22)
CALCIUM: 9.4 mg/dL (ref 8.0–10.3)
CHLORIDE: 104 mmol/L (ref 98–108)
CO2: 28 mmol/L (ref 18–33)
Creatinine: 0.9 mg/dL (ref 0.60–1.20)
Glucose, Bld: 105 mg/dL (ref 73–118)
Potassium: 3.4 mmol/L (ref 3.3–4.7)
Sodium: 144 mmol/L (ref 128–145)
Total Protein: 7.4 g/dL (ref 6.4–8.1)

## 2017-11-06 LAB — CBC WITH DIFFERENTIAL (CANCER CENTER ONLY)
Basophils Absolute: 0 10*3/uL (ref 0.0–0.1)
Basophils Relative: 0 %
Eosinophils Absolute: 0.5 10*3/uL (ref 0.0–0.5)
Eosinophils Relative: 7 %
HEMATOCRIT: 32.9 % — AB (ref 34.8–46.6)
Hemoglobin: 10.5 g/dL — ABNORMAL LOW (ref 11.6–15.9)
LYMPHS ABS: 1.9 10*3/uL (ref 0.9–3.3)
LYMPHS PCT: 28 %
MCH: 30.1 pg (ref 26.0–34.0)
MCHC: 31.9 g/dL — AB (ref 32.0–36.0)
MCV: 94.3 fL (ref 81.0–101.0)
MONO ABS: 0.5 10*3/uL (ref 0.1–0.9)
MONOS PCT: 8 %
Neutro Abs: 3.9 10*3/uL (ref 1.5–6.5)
Neutrophils Relative %: 57 %
Platelet Count: 164 10*3/uL (ref 145–400)
RBC: 3.49 MIL/uL — ABNORMAL LOW (ref 3.70–5.32)
RDW: 13.7 % (ref 11.1–15.7)
WBC Count: 6.8 10*3/uL (ref 3.9–10.0)

## 2017-11-06 MED ORDER — DIPHENHYDRAMINE HCL 25 MG PO CAPS
ORAL_CAPSULE | ORAL | Status: AC
  Filled 2017-11-06: qty 1

## 2017-11-06 MED ORDER — ACETAMINOPHEN 325 MG PO TABS
650.0000 mg | ORAL_TABLET | Freq: Once | ORAL | Status: AC
  Administered 2017-11-06: 650 mg via ORAL

## 2017-11-06 MED ORDER — SODIUM CHLORIDE 0.9 % IV SOLN
Freq: Once | INTRAVENOUS | Status: AC
  Administered 2017-11-06: 09:00:00 via INTRAVENOUS

## 2017-11-06 MED ORDER — HEPARIN SOD (PORK) LOCK FLUSH 100 UNIT/ML IV SOLN
500.0000 [IU] | Freq: Once | INTRAVENOUS | Status: AC | PRN
  Administered 2017-11-06: 500 [IU]
  Filled 2017-11-06: qty 5

## 2017-11-06 MED ORDER — SODIUM CHLORIDE 0.9 % IV SOLN: 510.0000 mg | INTRAVENOUS | 12 refills | Status: DC | PRN

## 2017-11-06 MED ORDER — SODIUM CHLORIDE 0.9% FLUSH
10.0000 mL | INTRAVENOUS | Status: DC | PRN
  Administered 2017-11-06: 10 mL
  Filled 2017-11-06: qty 10

## 2017-11-06 MED ORDER — SODIUM CHLORIDE 0.9 % IV SOLN
3.6000 mg/kg | Freq: Once | INTRAVENOUS | Status: AC
  Administered 2017-11-06: 300 mg via INTRAVENOUS
  Filled 2017-11-06: qty 15

## 2017-11-06 MED ORDER — SODIUM CHLORIDE 0.9 % IV SOLN
510.0000 mg | Freq: Once | INTRAVENOUS | Status: AC
  Administered 2017-11-06: 510 mg via INTRAVENOUS
  Filled 2017-11-06: qty 17

## 2017-11-06 MED ORDER — ACETAMINOPHEN 325 MG PO TABS
ORAL_TABLET | ORAL | Status: AC
  Filled 2017-11-06: qty 2

## 2017-11-06 MED ORDER — DIPHENHYDRAMINE HCL 25 MG PO CAPS
50.0000 mg | ORAL_CAPSULE | Freq: Once | ORAL | Status: AC
  Administered 2017-11-06: 25 mg via ORAL

## 2017-11-06 NOTE — Progress Notes (Signed)
OK to treat with Kadcyla today with labs from 10/28/17 per Dr. Marin Olp.  Dr. Marin Olp notified that last Echo was on 06/17/17 and next Echo is scheduled for 11/25/17.  OK to proceed with Kadcyla today per order of Dr. Marin Olp.   OK to give  Benadryl 25 mg PO as pre med per pt request per Dr. Marin Olp and not Benadryl 50 mg PO as ordered.   Pt refused to stay for 90 minute post Kadcyla observation.  VS stable and pt without complaints at time of discharge.

## 2017-11-10 ENCOUNTER — Other Ambulatory Visit (HOSPITAL_COMMUNITY): Payer: Self-pay | Admitting: Radiation Oncology

## 2017-11-10 DIAGNOSIS — C50111 Malignant neoplasm of central portion of right female breast: Secondary | ICD-10-CM

## 2017-11-12 ENCOUNTER — Encounter

## 2017-11-12 ENCOUNTER — Ambulatory Visit: Payer: Medicaid Other

## 2017-11-12 DIAGNOSIS — M25621 Stiffness of right elbow, not elsewhere classified: Secondary | ICD-10-CM

## 2017-11-12 DIAGNOSIS — Z483 Aftercare following surgery for neoplasm: Secondary | ICD-10-CM | POA: Diagnosis not present

## 2017-11-12 DIAGNOSIS — M6281 Muscle weakness (generalized): Secondary | ICD-10-CM

## 2017-11-12 DIAGNOSIS — R293 Abnormal posture: Secondary | ICD-10-CM

## 2017-11-12 DIAGNOSIS — M79601 Pain in right arm: Secondary | ICD-10-CM

## 2017-11-12 NOTE — Therapy (Signed)
[place], [place], [address] Phone: [phone]   Fax:  [phone]  Physical Therapy Treatment  Patient Details  Name: Sophia [name] MRN: [id number] Date of Birth: July 31, 1990 Referring Provider: Dr. Dalbert Batman  (Dt. Thimmappa is Psychiatric nurse.  She will have a DIEP flap)   Encounter Date: 11/12/2017  PT End of Session - 11/12/17 0932    Visit Number  2    Number of Visits  9    Date for PT Re-Evaluation  12/05/17    PT Start Time  0849    PT Stop Time  0932    PT Time Calculation (min)  43 min    Activity Tolerance  Patient tolerated treatment well    Behavior During Therapy  [facility] for tasks assessed/performed       Past Medical History:  Diagnosis Date  . BRCA1 positive   . Family history of breast cancer   . History of chemotherapy    finished 08/21/2017  . Iron deficiency anemia due to chronic blood loss 10/28/2017  . Mouth ulcers 10/02/2017  . Stage II infiltrating ductal breast carcinoma, ER-, right ([place]) 06/11/2017    Past Surgical History:  Procedure Laterality Date  . BREAST RECONSTRUCTION WITH PLACEMENT OF TISSUE EXPANDER AND FLEX HD (ACELLULAR HYDRATED DERMIS) Bilateral 10/09/2017   Procedure: BILATERAL BREAST RECONSTRUCTION WITH PLACEMENT OF TISSUE EXPANDER AND FLEX HD (ACELLULAR HYDRATED DERMIS);  Surgeon: [name], MD;  Location: [place];  Service: Plastics;  Laterality: Bilateral;  . IR FLUORO GUIDE PORT INSERTION RIGHT  06/18/2017  . IR US GUIDE VASC ACCESS RIGHT  06/18/2017  . MASTECTOMY MODIFIED RADICAL Right 10/09/2017   Procedure: MASTECTOMY MODIFIED RADICAL;  Surgeon: Fanny Skates, MD;  Location: [place];  Service: General;  Laterality: Right;  . TOTAL MASTECTOMY Left 10/09/2017   Procedure: LEFT PROPHYLATIC MASTECTOMY;  Surgeon: Fanny Skates, MD;  Location: [place];  Service: General;  Laterality: Left;    There  were no vitals filed for this visit.  Subjective Assessment - 11/12/17 0852    Subjective  I've been doing the exercises she gave me last time and those are going well.     Pertinent History  right sided breast cancer diagnosed in December 2018.  On 10/09/2017 she had right  mastectomy with 12 nodes removed and  left prophylactic mastectomy  with immediate expanders bilaterally.  She had 5 drains.  She had  neoadjuvant chemo and will be having radiation  She will have DEIP flaps after that.     Patient Stated Goals  "I want my arms back" wants to play her kids and go kayaking     Currently in Pain?  No/denies                       [facility] Adult PT Treatment/Exercise - 11/12/17 0001      Manual Therapy   Manual Therapy  Manual Lymphatic Drainage (MLD);Myofascial release;Passive ROM;Scapular mobilization    Myofascial Release  To Rt axilla and with UE pulling throughout P/ROM    Scapular Mobilization  In Lt S/L into protraction and retraction with P/ROM    Manual Lymphatic Drainage (MLD)  In Supine: Short neck, superficial and deep abdominals, Rt inguinal and Lt axillary nodes, Rt axillo-inguinal anastomosis, then focused on Rt axilla and lateral flank areas of swelling; then into Lt S/L for continued work to [employer] axillo-inguinal anastomosis and posterior inter-axillary anastomosis (avoided  anterior today due to port directly in path and her swelling is more at side and towards back)    Passive ROM  In Supine to Rt shoulder into flexion, abduction and radiation position to pts tolerance                  PT Long Term Goals - 11/05/17 1700      PT LONG TERM GOAL #1   Title  pt will be independent in lymphedem risk reduction practices     Time  4    Period  Weeks    Status  New      PT LONG TERM GOAL #2   Title  Pt will have 140 degrees of right shoulder abduction so that she can easily get into radiation position     Time  4    Period  Weeks    Status  New      PT  LONG TERM GOAL #3   Title  Pt will be independent in  home exercise program     Time  4    Period  Weeks    Status  New      PT LONG TERM GOAL #4   Title  Pt will decrease Quick DASH score to < 30 demonstrating a functional improvment in her UE     Baseline  61.36    Time  4    Period  Weeks    Status  New            Plan - 11/12/17 0932    Clinical Impression Statement  Pt tolerated first session of stretching and manual lymph drainage well today. Did not progress HEP today as her end ROM is still tight and also limited due to neural tightness so instructed her to cont with supine dowel exercises. Pt vrbalized understanding this and reports feeling looser at end of session.      Rehab Potential  Good    Clinical Impairments Affecting Rehab Potential  previous chemo, has expanders and will have fills     PT Frequency  2x / week    PT Duration  4 weeks    PT Treatment/Interventions  ADLs/Self Care Home Management;DME Instruction;Orthotic Fit/Training;Manual techniques;Passive range of motion;Manual lymph drainage;Taping;Therapeutic exercise    PT Next Visit Plan  Cont manual lymph drainage to right back and axilla instructing pt in same, also cont PROM and manual techniques to right arm to get to radition position. progress HEP (add supine scapular series next) and exercise. try to scheule around ABC class.  May need to suspend PT during radiation aslong as pt continues to stretch and then teach Strength ABC program, help with compression sleeve     Consulted and Agree with Plan of Care  Patient       Patient will benefit from skilled therapeutic intervention in order to improve the following deficits and impairments:  Decreased knowledge of use of DME, Increased fascial restricitons, Pain, Decreased scar mobility, Increased muscle spasms, Postural dysfunction, Decreased activity tolerance, Decreased range of motion, Decreased strength, Impaired perceived functional ability, Decreased  knowledge of precautions, Increased edema  Visit Diagnosis: Aftercare following surgery for neoplasm  Stiffness of right upper arm joint  Pain in right arm  Muscle weakness (generalized)  Abnormal posture     Problem List Patient Active Problem List   Diagnosis Date Noted  . Iron deficiency anemia due to chronic blood loss 10/28/2017  . Breast cancer, right ([place] ) 10/09/2017  .  Family history of breast cancer 09/08/2017  . BRCA1 positive 09/08/2017  . Genetic testing 06/26/2017  . Stage III breast cancer in female ([place]) 06/11/2017  . Counseling regarding goals of care 06/11/2017  . Syphilis complicating pregnancy 44/05/270    Otelia Limes, PTA 11/12/2017, 9:36 AM  [place], [place], [address] Phone: [phone]   Fax:  [phone]  Name: Sophia [name] MRN: [id number] Date of Birth: [birth year]

## 2017-11-13 ENCOUNTER — Encounter: Payer: Self-pay | Admitting: Physical Therapy

## 2017-11-16 ENCOUNTER — Ambulatory Visit (HOSPITAL_COMMUNITY): Payer: Self-pay

## 2017-11-17 ENCOUNTER — Ambulatory Visit: Payer: Medicaid Other

## 2017-11-20 ENCOUNTER — Ambulatory Visit: Payer: Medicaid Other | Admitting: Rehabilitation

## 2017-11-22 ENCOUNTER — Other Ambulatory Visit: Payer: Self-pay | Admitting: Hematology & Oncology

## 2017-11-23 ENCOUNTER — Ambulatory Visit: Payer: Medicaid Other | Attending: General Surgery

## 2017-11-23 DIAGNOSIS — M6281 Muscle weakness (generalized): Secondary | ICD-10-CM | POA: Insufficient documentation

## 2017-11-23 DIAGNOSIS — M79601 Pain in right arm: Secondary | ICD-10-CM | POA: Diagnosis present

## 2017-11-23 DIAGNOSIS — R293 Abnormal posture: Secondary | ICD-10-CM

## 2017-11-23 DIAGNOSIS — Z483 Aftercare following surgery for neoplasm: Secondary | ICD-10-CM | POA: Diagnosis present

## 2017-11-23 DIAGNOSIS — M25621 Stiffness of right elbow, not elsewhere classified: Secondary | ICD-10-CM | POA: Diagnosis present

## 2017-11-23 NOTE — Patient Instructions (Signed)

## 2017-11-23 NOTE — Therapy (Signed)
[place], [place], [address] Phone: [phone]   Fax:  [phone]  Physical Therapy Treatment  Patient Details  Name: Sophia [name] MRN: [id number] Date of Birth: November 23, 1990 Referring Provider: Dr. Dalbert Batman  (Dt. Thimmappa is Psychiatric nurse.  She will have a DIEP flap)   Encounter Date: 11/23/2017  PT End of Session - 11/23/17 1044    Visit Number  3    Number of Visits  9    Date for PT Re-Evaluation  12/05/17    PT Start Time  0935    PT Stop Time  1016    PT Time Calculation (min)  41 min    Activity Tolerance  Patient tolerated treatment well    Behavior During Therapy  [facility] for tasks assessed/performed       Past Medical History:  Diagnosis Date  . BRCA1 positive   . Family history of breast cancer   . History of chemotherapy    finished 08/21/2017  . Iron deficiency anemia due to chronic blood loss 10/28/2017  . Mouth ulcers 10/02/2017  . Stage II infiltrating ductal breast carcinoma, ER-, right ([place]) 06/11/2017    Past Surgical History:  Procedure Laterality Date  . BREAST RECONSTRUCTION WITH PLACEMENT OF TISSUE EXPANDER AND FLEX HD (ACELLULAR HYDRATED DERMIS) Bilateral 10/09/2017   Procedure: BILATERAL BREAST RECONSTRUCTION WITH PLACEMENT OF TISSUE EXPANDER AND FLEX HD (ACELLULAR HYDRATED DERMIS);  Surgeon: [name], MD;  Location: [place];  Service: Plastics;  Laterality: Bilateral;  . IR FLUORO GUIDE PORT INSERTION RIGHT  06/18/2017  . IR US GUIDE VASC ACCESS RIGHT  06/18/2017  . MASTECTOMY MODIFIED RADICAL Right 10/09/2017   Procedure: MASTECTOMY MODIFIED RADICAL;  Surgeon: Fanny Skates, MD;  Location: [place];  Service: General;  Laterality: Right;  . TOTAL MASTECTOMY Left 10/09/2017   Procedure: LEFT PROPHYLATIC MASTECTOMY;  Surgeon: Fanny Skates, MD;  Location: [place];  Service: General;  Laterality: Left;    There  were no vitals filed for this visit.  Subjective Assessment - 11/23/17 0938    Subjective  I was able to walk the zoo with my boys last week and I was tired but able to do it and had fun. Continuing to do the exercises and I can tell my ROM is improving.    Pertinent History  right sided breast cancer diagnosed in December 2018.  On 10/09/2017 she had right  mastectomy with 12 nodes removed and  left prophylactic mastectomy  with immediate expanders bilaterally.  She had 5 drains.  She had  neoadjuvant chemo and will be having radiation  She will have DEIP flaps after that.     Patient Stated Goals  "I want my arms back" wants to play her kids and go kayaking     Currently in Pain?  No/denies         [facility] PT Assessment - 11/23/17 0001      AROM   Right Shoulder Flexion  144 Degrees    Right Shoulder ABduction  138 Degrees                   [facility] Adult PT Treatment/Exercise - 11/23/17 0001      Shoulder Exercises: Supine   Horizontal ABduction  Strengthening;Both;10 reps;Theraband    Theraband Level (Shoulder Horizontal ABduction)  Level 2 (Red)    External Rotation  Strengthening;Both;10 reps;Theraband    Theraband Level (Shoulder External Rotation)  Level 2 (Red)  Flexion  Strengthening;Both;10 reps;Theraband Narrow and Wide Grip, 10 times each    Theraband Level (Shoulder Flexion)  Level 2 (Red)    Diagonals  Strengthening;Right;Left;10 reps;Theraband    Theraband Level (Shoulder Diagonals)  Level 2 (Red)      Shoulder Exercises: Pulleys   Flexion  2 minutes    Flexion Limitations  VCs and demonstration for technique    ABduction  2 minutes    ABduction Limitations  Pt returned therapist demonstration      Manual Therapy   Manual Therapy  Myofascial release;Passive ROM;Neural Stretch    Myofascial Release  To Rt axilla and with UE pulling throughout P/ROM    Passive ROM  In Supine to Rt shoulder into flexion, abduction and radiation position to pts tolerance     Neural Stretch  To Rt UE              PT Education - 11/23/17 0949    Education Details  Supine scapular series with red theraband    Person(s) Educated  Patient    Methods  Explanation;Demonstration;Handout    Comprehension  Verbalized understanding;Returned demonstration          PT Long Term Goals - 11/05/17 1700      PT LONG TERM GOAL #1   Title  pt will be independent in lymphedem risk reduction practices     Time  4    Period  Weeks    Status  New      PT LONG TERM GOAL #2   Title  Pt will have 140 degrees of right shoulder abduction so that she can easily get into radiation position     Time  4    Period  Weeks    Status  New      PT LONG TERM GOAL #3   Title  Pt will be independent in  home exercise program     Time  4    Period  Weeks    Status  New      PT LONG TERM GOAL #4   Title  Pt will decrease Quick DASH score to < 30 demonstrating a functional improvment in her UE     Baseline  61.36    Time  4    Period  Weeks    Status  New            Plan - 11/23/17 1044    Clinical Impression Statement  Pts A/ROM has improved greatly since last time measured. She reports noticing good gains with her ADLs as well and with caring for her young boys. Progressed HEP to include supine scapular series which she tolerated well. Continued with manual therapy including P/ROM which is greatly improved but she is still limited by neural tightness in certain degrees of abduction.     Rehab Potential  Good    Clinical Impairments Affecting Rehab Potential  previous chemo, has expanders and will have fills     PT Frequency  2x / week    PT Duration  4 weeks    PT Treatment/Interventions  ADLs/Self Care Home Management;DME Instruction;Orthotic Fit/Training;Manual techniques;Passive range of motion;Manual lymph drainage;Taping;Therapeutic exercise    PT Next Visit Plan  Cont manual lymph drainage to right back and axilla prn and instructing pt in same, also cont PROM  and manual techniques to right arm to get to radition position. Review HEP. try to scheule around ABC class.  May need to suspend PT during radiation as long  as pt continues to stretch and then teach Strength ABC program, help with compression sleeve     PT Home Exercise Plan  supine dowel rod exercise; supine scapular series and encouraged pt to start incorporating more end ROM stretching into day (like using doorframes when leaving rooms)    Consulted and Agree with Plan of Care  Patient       Patient will benefit from skilled therapeutic intervention in order to improve the following deficits and impairments:  Decreased knowledge of use of DME, Increased fascial restricitons, Pain, Decreased scar mobility, Increased muscle spasms, Postural dysfunction, Decreased activity tolerance, Decreased range of motion, Decreased strength, Impaired perceived functional ability, Decreased knowledge of precautions, Increased edema  Visit Diagnosis: Aftercare following surgery for neoplasm  Stiffness of right upper arm joint  Pain in right arm  Muscle weakness (generalized)  Abnormal posture     Problem List Patient Active Problem List   Diagnosis Date Noted  . Iron deficiency anemia due to chronic blood loss 10/28/2017  . Breast cancer, right ([place]) 10/09/2017  . Family history of breast cancer 09/08/2017  . BRCA1 positive 09/08/2017  . Genetic testing 06/26/2017  . Stage III breast cancer in female ([place]) 06/11/2017  . Counseling regarding goals of care 06/11/2017  . Syphilis complicating pregnancy 80/16/5537    Otelia Limes, PTA 11/23/2017, 10:52 AM  [place], [place], [address] Phone: [phone]   Fax:  [phone]  Name: Sophia [name] MRN: [id number] Date of Birth: Jul 08, 1990

## 2017-11-25 ENCOUNTER — Other Ambulatory Visit: Payer: Self-pay

## 2017-11-25 ENCOUNTER — Ambulatory Visit (INDEPENDENT_AMBULATORY_CARE_PROVIDER_SITE_OTHER): Payer: Self-pay

## 2017-11-25 ENCOUNTER — Ambulatory Visit: Payer: Medicaid Other

## 2017-11-25 DIAGNOSIS — Z483 Aftercare following surgery for neoplasm: Secondary | ICD-10-CM | POA: Diagnosis not present

## 2017-11-25 DIAGNOSIS — C50911 Malignant neoplasm of unspecified site of right female breast: Secondary | ICD-10-CM

## 2017-11-25 DIAGNOSIS — M25621 Stiffness of right elbow, not elsewhere classified: Secondary | ICD-10-CM

## 2017-11-25 DIAGNOSIS — R293 Abnormal posture: Secondary | ICD-10-CM

## 2017-11-25 DIAGNOSIS — M6281 Muscle weakness (generalized): Secondary | ICD-10-CM

## 2017-11-25 DIAGNOSIS — M79601 Pain in right arm: Secondary | ICD-10-CM

## 2017-11-25 DIAGNOSIS — Z171 Estrogen receptor negative status [ER-]: Secondary | ICD-10-CM

## 2017-11-25 DIAGNOSIS — Z5111 Encounter for antineoplastic chemotherapy: Secondary | ICD-10-CM

## 2017-11-25 NOTE — Therapy (Signed)
[place], [place], [address] Phone: [phone]   Fax:  [phone]  Physical Therapy Treatment  Patient Details  Name: Sophia [name] MRN: [id number] Date of Birth: July 26, 1990 Referring Provider: Dr. Dalbert Batman  (Dt. Thimmappa is Psychiatric nurse.  She will have a DIEP flap)   Encounter Date: 11/25/2017  PT End of Session - 11/25/17 1347    Visit Number  4    Number of Visits  9    Date for PT Re-Evaluation  12/05/17    PT Start Time  1301    PT Stop Time  1346    PT Time Calculation (min)  45 min    Activity Tolerance  Patient tolerated treatment well    Behavior During Therapy  [facility] for tasks assessed/performed       Past Medical History:  Diagnosis Date  . BRCA1 positive   . Family history of breast cancer   . History of chemotherapy    finished 08/21/2017  . Iron deficiency anemia due to chronic blood loss 10/28/2017  . Mouth ulcers 10/02/2017  . Stage II infiltrating ductal breast carcinoma, ER-, right ([place]) 06/11/2017    Past Surgical History:  Procedure Laterality Date  . BREAST RECONSTRUCTION WITH PLACEMENT OF TISSUE EXPANDER AND FLEX HD (ACELLULAR HYDRATED DERMIS) Bilateral 10/09/2017   Procedure: BILATERAL BREAST RECONSTRUCTION WITH PLACEMENT OF TISSUE EXPANDER AND FLEX HD (ACELLULAR HYDRATED DERMIS);  Surgeon: [name], MD;  Location: [place];  Service: Plastics;  Laterality: Bilateral;  . IR FLUORO GUIDE PORT INSERTION RIGHT  06/18/2017  . IR US GUIDE VASC ACCESS RIGHT  06/18/2017  . MASTECTOMY MODIFIED RADICAL Right 10/09/2017   Procedure: MASTECTOMY MODIFIED RADICAL;  Surgeon: Fanny Skates, MD;  Location: [place];  Service: General;  Laterality: Right;  . TOTAL MASTECTOMY Left 10/09/2017   Procedure: LEFT PROPHYLATIC MASTECTOMY;  Surgeon: Fanny Skates, MD;  Location: [place];  Service: General;  Laterality: Left;    There  were no vitals filed for this visit.  Subjective Assessment - 11/25/17 1306    Subjective  I had my simulation in [place] at [facility] but my doctor said he doesn't think he can work with their technology to keep the radiation from hitting any vital organs due to my expanders. So I'll be having my radiation in [place] now but I don't know the start date yet. It probably won't be until the end of July bc I meet with the doctors here next week and then I go to the beach for a week. My Rt shoulder felt good after last visit and I was mostly able to get into position fo radiation.     Pertinent History  right sided breast cancer diagnosed in December 2018.  On 10/09/2017 she had right  mastectomy with 12 nodes removed and  left prophylactic mastectomy  with immediate expanders bilaterally.  She had 5 drains.  She had  neoadjuvant chemo and will be having radiation  She will have DEIP flaps after that.     Patient Stated Goals  "I want my arms back" wants to play her kids and go kayaking     Currently in Pain?  No/denies                       [facility] Adult PT Treatment/Exercise - 11/25/17 0001      Shoulder Exercises: Pulleys   Flexion  2 minutes 1 lb on  wrist    Flexion Limitations  Reminded pt of corect technique    ABduction  2 minutes 1 lb on wrist    ABduction Limitations  Pt with correct technique      Shoulder Exercises: Therapy Ball   Flexion  Both;10 reps Forwar dlean into end of stretch; 1 lb on wrists    ABduction  Right;10 reps Same side lean into end of stretch; 1 lb on wrist      Manual Therapy   Manual Therapy  Myofascial release;Passive ROM;Neural Stretch    Myofascial Release  To Rt axilla and with UE pulling throughout P/ROM    Manual Lymphatic Drainage (MLD)  In Supine: Short neck, superficial and deep abdominals, Rt inguinal and Lt axillary nodes, Rt axillo-inguinal anastomosis (avoided anterior inter-axillary anastomosis due to port and swelling is  posterior) then focused on Rt axilla and lateral flank areas of swelling; then into Lt S/L for continued work to [employer] axillo-inguinal anastomosis and posterior inter-axillary anastomosis    Passive ROM  In Supine to Rt shoulder into flexion, abduction and radiation position to pts tolerance    Neural Stretch  To Rt UE                   PT Long Term Goals - 11/05/17 1700      PT LONG TERM GOAL #1   Title  pt will be independent in lymphedem risk reduction practices     Time  4    Period  Weeks    Status  New      PT LONG TERM GOAL #2   Title  Pt will have 140 degrees of right shoulder abduction so that she can easily get into radiation position     Time  4    Period  Weeks    Status  New      PT LONG TERM GOAL #3   Title  Pt will be independent in  home exercise program     Time  4    Period  Weeks    Status  New      PT LONG TERM GOAL #4   Title  Pt will decrease Quick DASH score to < 30 demonstrating a functional improvment in her UE     Baseline  61.36    Time  4    Period  Weeks    Status  New            Plan - 11/25/17 1348    Clinical Impression Statement  Pt had her radiation simulation yesterday and though the plan is changing slightly pushing off her start date a few weeks (is now going to have it [place] instead of [place]) whe reports was able to get into position for this "okay". Continued with gently P/ROM with slow, prolonged holds and she did not have nerve pain while stetching today as she has been having each time.     Rehab Potential  Good    Clinical Impairments Affecting Rehab Potential  previous chemo, has expanders and will have fills     PT Frequency  2x / week    PT Duration  4 weeks    PT Treatment/Interventions  ADLs/Self Care Home Management;DME Instruction;Orthotic Fit/Training;Manual techniques;Passive range of motion;Manual lymph drainage;Taping;Therapeutic exercise    PT Next Visit Plan  Cont manual lymph drainage to right back  and axilla prn and instructing pt in same, also cont PROM and manual techniques to right arm to get  to radition position. Review HEP. try to scheule around ABC class.  Eventually teach Strength ABC program, help with compression sleeve     Consulted and Agree with Plan of Care  Patient       Patient will benefit from skilled therapeutic intervention in order to improve the following deficits and impairments:  Decreased knowledge of use of DME, Increased fascial restricitons, Pain, Decreased scar mobility, Increased muscle spasms, Postural dysfunction, Decreased activity tolerance, Decreased range of motion, Decreased strength, Impaired perceived functional ability, Decreased knowledge of precautions, Increased edema  Visit Diagnosis: Aftercare following surgery for neoplasm  Stiffness of right upper arm joint  Pain in right arm  Muscle weakness (generalized)  Abnormal posture     Problem List Patient Active Problem List   Diagnosis Date Noted  . Iron deficiency anemia due to chronic blood loss 10/28/2017  . Breast cancer, right ([place]) 10/09/2017  . Family history of breast cancer 09/08/2017  . BRCA1 positive 09/08/2017  . Genetic testing 06/26/2017  . Stage III breast cancer in female ([place]) 06/11/2017  . Counseling regarding goals of care 06/11/2017  . Syphilis complicating pregnancy 36/46/8032    Otelia Limes, PTA 11/25/2017, 1:51 PM  [place], [place], [address] Phone: [phone]   Fax:  [phone]  Name: Freedom Lopezperez MRN: [id number] Date of Birth: [birth year]

## 2017-11-27 ENCOUNTER — Inpatient Hospital Stay: Payer: Medicaid Other

## 2017-11-27 ENCOUNTER — Other Ambulatory Visit: Payer: Self-pay | Admitting: Family

## 2017-11-27 ENCOUNTER — Inpatient Hospital Stay: Payer: Medicaid Other | Attending: Hematology & Oncology | Admitting: Family

## 2017-11-27 ENCOUNTER — Encounter: Payer: Self-pay | Admitting: Radiation Oncology

## 2017-11-27 ENCOUNTER — Other Ambulatory Visit: Payer: Self-pay

## 2017-11-27 ENCOUNTER — Encounter: Payer: Self-pay | Admitting: Family

## 2017-11-27 VITALS — BP 100/67 | HR 75 | Temp 97.9°F | Resp 17 | Wt 184.0 lb

## 2017-11-27 DIAGNOSIS — C50919 Malignant neoplasm of unspecified site of unspecified female breast: Secondary | ICD-10-CM

## 2017-11-27 DIAGNOSIS — C50911 Malignant neoplasm of unspecified site of right female breast: Secondary | ICD-10-CM | POA: Diagnosis not present

## 2017-11-27 DIAGNOSIS — Z171 Estrogen receptor negative status [ER-]: Secondary | ICD-10-CM

## 2017-11-27 DIAGNOSIS — Z5111 Encounter for antineoplastic chemotherapy: Secondary | ICD-10-CM | POA: Insufficient documentation

## 2017-11-27 DIAGNOSIS — Z5112 Encounter for antineoplastic immunotherapy: Secondary | ICD-10-CM | POA: Insufficient documentation

## 2017-11-27 DIAGNOSIS — R200 Anesthesia of skin: Secondary | ICD-10-CM | POA: Diagnosis not present

## 2017-11-27 DIAGNOSIS — C50011 Malignant neoplasm of nipple and areola, right female breast: Secondary | ICD-10-CM

## 2017-11-27 DIAGNOSIS — D500 Iron deficiency anemia secondary to blood loss (chronic): Secondary | ICD-10-CM

## 2017-11-27 LAB — CBC WITH DIFFERENTIAL (CANCER CENTER ONLY)
BASOS ABS: 0 10*3/uL (ref 0.0–0.1)
BASOS PCT: 0 %
EOS PCT: 4 %
Eosinophils Absolute: 0.3 10*3/uL (ref 0.0–0.5)
HCT: 36.5 % (ref 34.8–46.6)
Hemoglobin: 11.8 g/dL (ref 11.6–15.9)
LYMPHS PCT: 31 %
Lymphs Abs: 2.1 10*3/uL (ref 0.9–3.3)
MCH: 29.3 pg (ref 26.0–34.0)
MCHC: 32.3 g/dL (ref 32.0–36.0)
MCV: 90.6 fL (ref 81.0–101.0)
MONO ABS: 0.5 10*3/uL (ref 0.1–0.9)
Monocytes Relative: 8 %
Neutro Abs: 3.7 10*3/uL (ref 1.5–6.5)
Neutrophils Relative %: 57 %
PLATELETS: 178 10*3/uL (ref 145–400)
RBC: 4.03 MIL/uL (ref 3.70–5.32)
RDW: 14.3 % (ref 11.1–15.7)
WBC: 6.6 10*3/uL (ref 3.9–10.0)

## 2017-11-27 LAB — CMP (CANCER CENTER ONLY)
ALT: 37 U/L (ref 10–47)
ANION GAP: 12 (ref 5–15)
AST: 31 U/L (ref 11–38)
Albumin: 3.6 g/dL (ref 3.5–5.0)
Alkaline Phosphatase: 71 U/L (ref 26–84)
BUN: 12 mg/dL (ref 7–22)
CALCIUM: 9.8 mg/dL (ref 8.0–10.3)
CO2: 29 mmol/L (ref 18–33)
Chloride: 101 mmol/L (ref 98–108)
Creatinine: 0.8 mg/dL (ref 0.60–1.20)
GLUCOSE: 94 mg/dL (ref 73–118)
POTASSIUM: 3.7 mmol/L (ref 3.3–4.7)
SODIUM: 142 mmol/L (ref 128–145)
TOTAL PROTEIN: 8 g/dL (ref 6.4–8.1)
Total Bilirubin: 0.6 mg/dL (ref 0.2–1.6)

## 2017-11-27 MED ORDER — SODIUM CHLORIDE 0.9 % IV SOLN
Freq: Once | INTRAVENOUS | Status: AC
  Administered 2017-11-27: 10:00:00 via INTRAVENOUS

## 2017-11-27 MED ORDER — ACETAMINOPHEN 325 MG PO TABS
650.0000 mg | ORAL_TABLET | Freq: Once | ORAL | Status: AC
  Administered 2017-11-27: 650 mg via ORAL

## 2017-11-27 MED ORDER — SODIUM CHLORIDE 0.9% FLUSH
10.0000 mL | INTRAVENOUS | Status: DC | PRN
  Administered 2017-11-27: 10 mL
  Filled 2017-11-27: qty 10

## 2017-11-27 MED ORDER — SODIUM CHLORIDE 0.9 % IJ SOLN
10.0000 mL | Freq: Once | INTRAMUSCULAR | Status: AC
  Administered 2017-11-27: 10 mL
  Filled 2017-11-27: qty 10

## 2017-11-27 MED ORDER — HEPARIN SOD (PORK) LOCK FLUSH 100 UNIT/ML IV SOLN
500.0000 [IU] | Freq: Once | INTRAVENOUS | Status: AC | PRN
  Administered 2017-11-27: 500 [IU]
  Filled 2017-11-27: qty 5

## 2017-11-27 MED ORDER — DIPHENHYDRAMINE HCL 25 MG PO CAPS
ORAL_CAPSULE | ORAL | Status: AC
  Filled 2017-11-27: qty 2

## 2017-11-27 MED ORDER — DIPHENHYDRAMINE HCL 25 MG PO CAPS
50.0000 mg | ORAL_CAPSULE | Freq: Once | ORAL | Status: AC
  Administered 2017-11-27: 25 mg via ORAL

## 2017-11-27 MED ORDER — ACETAMINOPHEN 325 MG PO TABS
ORAL_TABLET | ORAL | Status: AC
  Filled 2017-11-27: qty 2

## 2017-11-27 MED ORDER — SODIUM CHLORIDE 0.9 % IV SOLN
3.6000 mg/kg | Freq: Once | INTRAVENOUS | Status: AC
  Administered 2017-11-27: 300 mg via INTRAVENOUS
  Filled 2017-11-27: qty 15

## 2017-11-27 MED ORDER — DIPHENHYDRAMINE HCL 25 MG PO CAPS: 25.0000 mg | ORAL_CAPSULE | Freq: Once | ORAL | Status: DC

## 2017-11-27 NOTE — Progress Notes (Signed)
Hematology and Oncology Follow Up Visit  Sophia [name] [id number] 11/09/1990 27 y.o. 11/27/2017   Principle Diagnosis:  Stage IIIA (T3N2M0) - locally advanced invasive ductal carcinoma of the RIGHT breast - BRCA (+) - ER-/PR-/HER2+  Iron deficiency anemia - blood loss  Past Therapy: Taxotere/Carboplatin/Herceptin/Perjeta -s/p cycle 4 S/P Bilateral mastectomies - 10/09/2017    Ovarian ablation with Lupron - stopped 11/06/2017  Current Therapy:   Kadcyla - adjuvant therapy to started 11/06/2017, s/p cycle 1 Radiation Therapy - on hold, she is transfering to [facility] with Dr. Lisbeth Renshaw IV Iron as indicated - last received on 11/06/2017   Interim History:  Ms. Passmore is here today for follow-up and treatment. She tolerated her first cycle of Kadcyla well. She states that her radiation oncologist in [place] is now transferring her care to Dr. Lisbeth Renshaw at [facility] as he felt that his facility in [place] was not equipped enough to handle her treatment needs.  Breast exam today showed incisions from bilateral mastectomies clean, dry and intact. No evidence of infection. She does have some numbness in the right axilla since surgery in June.  No lymphadenopathy noted.  She is also waiting for clearance to set up her appointment for hysterectomy. She would like to do this on a Friday once she has returned from the [place] in a few weeks with her family. She is hoping to go back to work full time soon.  She is doing well in PT and has noted that her ROM with the right arm is improving.  No swelling, tenderness or tingling in her extremities at this time. She has noticed puffiness in her hands with her rings off and on.  She notes hot flashes when it gets close to her receiving Lupron again.  No fever, chills, n/v, cough, rash, dizziness, SOB, chest pain, palpitations, abdominal pain or changes in bowel or bladder habits.  No episodes of bleeding, no bruising or petechiae.  She has maintained a good  appetite and is staying well hydrated. Her weight is stable.   ECOG Performance Status: 1 - Symptomatic but completely ambulatory  Medications:  Allergies as of 11/27/2017   No Known Allergies     Medication List        Accurate as of 11/27/17  2:26 PM. Always use your most recent med list.          ado-trastuzumab emtansine 100 MG Solr Commonly known as:  KADCYLA Inject 15 mLs (300 mg total) into the vein every 21 ( twenty-one) days.   ferumoxytol 510 mg in sodium chloride 0.9 % 100 mL Inject 510 mg into the vein as needed.   leuprolide 3.75 MG injection Commonly known as:  LUPRON DEPOT (19-MONTH) Inject 3.75 mg into the muscle every 28 (twenty-eight) days.       Allergies: No Known Allergies  Past Medical History, Surgical history, Social history, and Family History were reviewed and updated.  Review of Systems: All other 10 point review of systems is negative.   Physical Exam:  weight is 184 lb (83.5 kg). Her oral temperature is 97.9 F (36.6 C). Her blood pressure is 100/67 and her pulse is 75. Her respiration is 17 and oxygen saturation is 100%.   Wt Readings from Last 3 Encounters:  11/27/17 184 lb (83.5 kg)  11/06/17 185 lb (83.9 kg)  10/28/17 181 lb (82.1 kg)    Ocular: Sclerae unicteric, pupils equal, round and reactive to light Ear-nose-throat: Oropharynx clear, dentition fair Lymphatic: No cervical, supraclavicular or  axillary adenopathy Lungs no rales or rhonchi, good excursion bilaterally Heart regular rate and rhythm, no murmur appreciated Abd soft, nontender, positive bowel sounds, no liver or spleen tip palpated on exam, no fluid wave  MSK no focal spinal tenderness, no joint edema Neuro: non-focal, well-oriented, appropriate affect Breasts: Deferred   Lab Results  Component Value Date   WBC 6.6 11/27/2017   HGB 11.8 11/27/2017   HCT 36.5 11/27/2017   MCV 90.6 11/27/2017   PLT 178 11/27/2017   Lab Results  Component Value Date   FERRITIN  19 10/28/2017   IRON 36 (L) 10/28/2017   TIBC 336 10/28/2017   UIBC 300 10/28/2017   IRONPCTSAT 11 (L) 10/28/2017   Lab Results  Component Value Date   RBC 4.03 11/27/2017   No results found for: KPAFRELGTCHN, LAMBDASER, KAPLAMBRATIO No results found for: IGGSERUM, IGA, IGMSERUM No results found for: Odetta Pink, SPEI   Chemistry      Component Value Date/Time   NA 142 11/27/2017 0915   K 3.7 11/27/2017 0915   CL 101 11/27/2017 0915   CO2 29 11/27/2017 0915   BUN 12 11/27/2017 0915   CREATININE 0.80 11/27/2017 0915      Component Value Date/Time   CALCIUM 9.8 11/27/2017 0915   ALKPHOS 71 11/27/2017 0915   AST 31 11/27/2017 0915   ALT 37 11/27/2017 0915   BILITOT 0.6 11/27/2017 0915      Impression and Plan: [name] is a very pleasant 27 yo caucasian female with locally advanced stage III ductal carcinoma of the right breast, HER-2 positive and ER negative. She is BRCA positive. She tolerated her first cycl of Kadcyla and we will proceed with treatment today as planned.  I will speak with Dr. Marin Olp once he returns next week about her clearance for surgery and transfer of rad/onc care to Dr. Lisbeth Renshaw at [facility].  She has her current treatment and appointment schedule and we will plan to see her back again on the 26th for treatment and follow-up.  She promises to contact our office with any questions or concerns. We can certainly see her sooner if need be.   Laverna Peace, NP 7/5/20192:26 PM

## 2017-11-27 NOTE — Addendum Note (Signed)
Addended by: Perlie Gold on: 11/27/2017 09:25 AM   Modules accepted: Orders

## 2017-11-27 NOTE — Patient Instructions (Signed)
Implanted Port Home Guide An implanted port is a type of central line that is placed under the skin. Central lines are used to provide IV access when treatment or nutrition needs to be given through a person's veins. Implanted ports are used for long-term IV access. An implanted port may be placed because:  You need IV medicine that would be irritating to the small veins in your hands or arms.  You need long-term IV medicines, such as antibiotics.  You need IV nutrition for a long period.  You need frequent blood draws for lab tests.  You need dialysis.  Implanted ports are usually placed in the chest area, but they can also be placed in the upper arm, the abdomen, or the leg. An implanted port has two main parts:  Reservoir. The reservoir is round and will appear as a small, raised area under your skin. The reservoir is the part where a needle is inserted to give medicines or draw blood.  Catheter. The catheter is a thin, flexible tube that extends from the reservoir. The catheter is placed into a large vein. Medicine that is inserted into the reservoir goes into the catheter and then into the vein.  How will I care for my incision site? Do not get the incision site wet. Bathe or shower as directed by your health care provider. How is my port accessed? Special steps must be taken to access the port:  Before the port is accessed, a numbing cream can be placed on the skin. This helps numb the skin over the port site.  Your health care provider uses a sterile technique to access the port. ? Your health care provider must put on a mask and sterile gloves. ? The skin over your port is cleaned carefully with an antiseptic and allowed to dry. ? The port is gently pinched between sterile gloves, and a needle is inserted into the port.  Only "non-coring" port needles should be used to access the port. Once the port is accessed, a blood return should be checked. This helps ensure that the port  is in the vein and is not clogged.  If your port needs to remain accessed for a constant infusion, a clear (transparent) bandage will be placed over the needle site. The bandage and needle will need to be changed every week, or as directed by your health care provider.  Keep the bandage covering the needle clean and dry. Do not get it wet. Follow your health care provider's instructions on how to take a shower or bath while the port is accessed.  If your port does not need to stay accessed, no bandage is needed over the port.  What is flushing? Flushing helps keep the port from getting clogged. Follow your health care provider's instructions on how and when to flush the port. Ports are usually flushed with saline solution or a medicine called heparin. The need for flushing will depend on how the port is used.  If the port is used for intermittent medicines or blood draws, the port will need to be flushed: ? After medicines have been given. ? After blood has been drawn. ? As part of routine maintenance.  If a constant infusion is running, the port may not need to be flushed.  How long will my port stay implanted? The port can stay in for as long as your health care provider thinks it is needed. When it is time for the port to come out, surgery will be   done to remove it. The procedure is similar to the one performed when the port was put in. When should I seek immediate medical care? When you have an implanted port, you should seek immediate medical care if:  You notice a bad smell coming from the incision site.  You have swelling, redness, or drainage at the incision site.  You have more swelling or pain at the port site or the surrounding area.  You have a fever that is not controlled with medicine.  This information is not intended to replace advice given to you by your health care provider. Make sure you discuss any questions you have with your health care provider. Document  Released: 05/12/2005 Document Revised: 10/18/2015 Document Reviewed: 01/17/2013 Elsevier Interactive Patient Education  2017 Elsevier Inc.  

## 2017-11-27 NOTE — Patient Instructions (Signed)
[facility] Discharge Instructions for Patients Receiving Chemotherapy  Today you received the following chemotherapy agents :  Kadcyla.  To help prevent nausea and vomiting after your treatment, we encourage you to take your nausea medication as prescribed.   If you develop nausea and vomiting that is not controlled by your nausea medication, call the clinic.   BELOW ARE SYMPTOMS THAT SHOULD BE REPORTED IMMEDIATELY:  *FEVER GREATER THAN 100.5 F  *CHILLS WITH OR WITHOUT FEVER  NAUSEA AND VOMITING THAT IS NOT CONTROLLED WITH YOUR NAUSEA MEDICATION  *UNUSUAL SHORTNESS OF BREATH  *UNUSUAL BRUISING OR BLEEDING  TENDERNESS IN MOUTH AND THROAT WITH OR WITHOUT PRESENCE OF ULCERS  *URINARY PROBLEMS  *BOWEL PROBLEMS  UNUSUAL RASH Items with * indicate a potential emergency and should be followed up as soon as possible.  Feel free to call the clinic should you have any questions or concerns. The clinic phone number is [phone].  Please show the CHEMO ALERT CARD at check-in to the Emergency Department and triage nurse.   

## 2017-11-30 ENCOUNTER — Ambulatory Visit: Payer: Medicaid Other | Admitting: Rehabilitation

## 2017-11-30 ENCOUNTER — Encounter: Payer: Self-pay | Admitting: Rehabilitation

## 2017-11-30 DIAGNOSIS — Z483 Aftercare following surgery for neoplasm: Secondary | ICD-10-CM | POA: Diagnosis not present

## 2017-11-30 DIAGNOSIS — M79601 Pain in right arm: Secondary | ICD-10-CM

## 2017-11-30 DIAGNOSIS — M25621 Stiffness of right elbow, not elsewhere classified: Secondary | ICD-10-CM

## 2017-11-30 DIAGNOSIS — M6281 Muscle weakness (generalized): Secondary | ICD-10-CM

## 2017-11-30 NOTE — Therapy (Addendum)
[place], [place], [address] Phone: [phone]   Fax:  [phone]  Physical Therapy Treatment  Patient Details  Name: Sophia [name] MRN: [id number] Date of Birth: November 20, 1990 Referring Provider: Dr. Dalbert Batman  (Dt. Thimmappa is Psychiatric nurse.  She will have a DIEP flap)   Encounter Date: 11/30/2017  PT End of Session - 11/30/17 0856    Visit Number  5    Number of Visits  9    Date for PT Re-Evaluation  12/05/17    PT Start Time  0850    PT Stop Time  0929    PT Time Calculation (min)  39 min    Activity Tolerance  Patient tolerated treatment well    Behavior During Therapy  [facility] for tasks assessed/performed       Past Medical History:  Diagnosis Date  . BRCA1 positive   . Family history of breast cancer   . History of chemotherapy    finished 08/21/2017  . Iron deficiency anemia due to chronic blood loss 10/28/2017  . Mouth ulcers 10/02/2017  . Stage II infiltrating ductal breast carcinoma, ER-, right ([place]) 06/11/2017    Past Surgical History:  Procedure Laterality Date  . BREAST RECONSTRUCTION WITH PLACEMENT OF TISSUE EXPANDER AND FLEX HD (ACELLULAR HYDRATED DERMIS) Bilateral 10/09/2017   Procedure: BILATERAL BREAST RECONSTRUCTION WITH PLACEMENT OF TISSUE EXPANDER AND FLEX HD (ACELLULAR HYDRATED DERMIS);  Surgeon: [name], MD;  Location: [facility];  Service: Plastics;  Laterality: Bilateral;  . IR FLUORO GUIDE PORT INSERTION RIGHT  06/18/2017  . IR US GUIDE VASC ACCESS RIGHT  06/18/2017  . MASTECTOMY MODIFIED RADICAL Right 10/09/2017   Procedure: MASTECTOMY MODIFIED RADICAL;  Surgeon: Fanny Skates, MD;  Location: [facility];  Service: General;  Laterality: Right;  . TOTAL MASTECTOMY Left 10/09/2017   Procedure: LEFT PROPHYLATIC MASTECTOMY;  Surgeon: Fanny Skates, MD;  Location: [facility];  Service: General;  Laterality: Left;    There  were no vitals filed for this visit.  Subjective Assessment - 11/30/17 0850    Subjective  A little swelling bilateral armpit regions today.  The L side seems a bit more puffy lately for no known reason    Pertinent History  right sided breast cancer diagnosed in December 2018.  On 10/09/2017 she had right  mastectomy with 12 nodes removed and  left prophylactic mastectomy  with immediate expanders bilaterally.  She had 5 drains.  She had  neoadjuvant chemo and will be having radiation  She will have DEIP flaps after that.     Patient Stated Goals  "I want my arms back" wants to play her kids and go kayaking                        [facility] Adult PT Treatment/Exercise - 11/30/17 0001      Shoulder Exercises: Pulleys   Flexion  2 minutes 1lb wt at wrist    ABduction  2 minutes 1lb wt at wrist      Shoulder Exercises: Therapy Ball   Flexion  Both;10 reps 1# wt at wrist    ABduction  Right;10 reps 1# wt at wrist      Manual Therapy   Manual Lymphatic Drainage (MLD)  In Supine: Short neck, superficial and deep abdominals, Rt inguinal and Lt axillary nodes, Rt axillo-inguinal anastomosis (avoided anterior inter-axillary anastomosis due to port and swelling is posterior) then focused on  Rt axilla and lateral flank areas of swelling; then into Lt S/L for continued work to Rt axillo-inguinal anastomosis and posterior inter-axillary anastomosis.  Some Rt sidelying work to the Lt axilla and trunk with complaints of increased swelling here. Working towards Rt axillary nodes.      Passive ROM  In Supine to Rt shoulder into flexion, abduction and radiation position to pts tolerance                  PT Long Term Goals - 11/05/17 1700      PT LONG TERM GOAL #1   Title  pt will be independent in lymphedem risk reduction practices     Time  4    Period  Weeks    Status  New      PT LONG TERM GOAL #2   Title  Pt will have 140 degrees of right shoulder abduction so that she can  easily get into radiation position     Time  4    Period  Weeks    Status  New      PT LONG TERM GOAL #3   Title  Pt will be independent in  home exercise program     Time  4    Period  Weeks    Status  New      PT LONG TERM GOAL #4   Title  Pt will decrease Quick DASH score to < 30 demonstrating a functional improvment in her UE     Baseline  61.36    Time  4    Period  Weeks    Status  New            Plan - 11/30/17 0929    Clinical Impression Statement  Pt arrives today with some feelings of increased puffiness and swelling in the Lt axillary area after being active in the pool this weekend.  Her Rt prom is WNL today without neural tension and is good for radiation position which she will start after returning from the beach.  She feels okay for stopping sessions after next visit until into radiation with follow up as needed.  No sigifnicant edema felt Rt lateral trunk today.  Some present Lt axillary region but most likely transient.      Clinical Impairments Affecting Rehab Potential  previous chemo, has expanders and will have fills     PT Treatment/Interventions  ADLs/Self Care Home Management;DME Instruction;Orthotic Fit/Training;Manual techniques;Passive range of motion;Manual lymph drainage;Taping;Therapeutic exercise    PT Next Visit Plan  review HEP for during radiation with shoulder ROM focus, give strength ABC hanout with education in case she does not return during radiation.  Rt shoulder ROM and MLD Rt trunk region and Lt axilary region if needed.  Goal check    PT Home Exercise Plan  supine dowel rod exercise; supine scapular series and encouraged pt to start incorporating more end ROM stretching into day (like using doorframes when leaving rooms)       Patient will benefit from skilled therapeutic intervention in order to improve the following deficits and impairments:  Decreased knowledge of use of DME, Increased fascial restricitons, Pain, Decreased scar  mobility, Increased muscle spasms, Postural dysfunction, Decreased activity tolerance, Decreased range of motion, Decreased strength, Impaired perceived functional ability, Decreased knowledge of precautions, Increased edema  Visit Diagnosis: Aftercare following surgery for neoplasm  Stiffness of right upper arm joint  Pain in right arm  Muscle weakness (generalized)  Problem List Patient Active Problem List   Diagnosis Date Noted  . Iron deficiency anemia due to chronic blood loss 10/28/2017  . Breast cancer, right ([place]) 10/09/2017  . Family history of breast cancer 09/08/2017  . BRCA1 positive 09/08/2017  . Genetic testing 06/26/2017  . Stage III breast cancer in female ([place]) 06/11/2017  . Counseling regarding goals of care 06/11/2017  . Syphilis complicating pregnancy 89/16/9450    Shan Levans, PT 11/30/2017, 9:33 AM  [place], [place], [address] Phone: [phone]   Fax:  [phone]  Name: Raymonda Pell MRN: [id number] Date of Birth: Feb 19, 1991  PHYSICAL THERAPY DISCHARGE SUMMARY  Visits from Start of Care: 5  Current functional level related to goals / functional outcomes: Intermittent swelling but doing well with HEP and shoulder status.  Did not return after last visit    Remaining deficits: Swelling, need for continued shoulder stretching and strengthening    Education / Equipment: HEP, lymphedema  Plan: Patient agrees to discharge.  Patient goals were partially met. Patient is being discharged due to not returning since the last visit.  ?????   Shan Levans, PT 02/26/18 8:31am

## 2017-12-01 ENCOUNTER — Other Ambulatory Visit: Payer: Self-pay | Admitting: Family

## 2017-12-02 ENCOUNTER — Encounter: Payer: Self-pay | Admitting: Hematology & Oncology

## 2017-12-02 ENCOUNTER — Encounter: Payer: Self-pay | Admitting: Physical Therapy

## 2017-12-02 LAB — ESTRADIOL, ULTRA SENS: Estradiol, Sensitive: 5.1 pg/mL

## 2017-12-02 NOTE — Progress Notes (Signed)
Location of Breast Cancer: Stage III ductal carcinoma of the right breast.  Locally advanced invasive ductal carcinoma of the Right Breast ER/PR -, HER2+  Did patient present with symptoms (if so, please note symptoms) or was this found on screening mammography?: Patient noted a lump in the right breast.  Mammogram 2018: high suspicion for a right breast mass measuring up to 3.7 cm.  There are smaller indeterminate masses.  She had suspicious right axillary adenopathy.  There was nothing noted in the left breast that was suspicious.  Biopsy 05/20/2017: Invasive ductal carcinoma involving all biopsies.  She had a biopsy of a 10:00 mass.  She had a biopsy of a 1:00 mass.  She had a biopsy of a right axillary lymph node.  Again all biopsies showed somewhat high-grade invasive ductal carcinoma.  PET 06/24/2017: No metastatic disease.  Histology per Pathology Report:10/09/2017 Bilateral Breast  Receptor Status: ER(- 0%), PR (- 0%), Her2-neu (+), Ki-(), BRCA positive   Past/Anticipated interventions by surgeon, if any: Bilateral mastectomies November 09, 2017  Past/Anticipated interventions by medical oncology, if any: Chemotherapy  NP [place] 11/27/2017 Past Therapy: Taxotere/Carboplatin/Herceptin/Perjeta -s/p cycle 4 S/P Bilateral mastectomies - 10/09/2017 Ovarian ablation with Lupron - stopped 11/06/2017 Current Therapy:        Kadcyla - adjuvant therapy to started 11/06/2017, s/p cycle 1 Radiation Therapy - on hold, she is transfering to [facility] with Dr. Lisbeth Renshaw IV Iron as indicated - last received on 11/06/2017 -She states that her radiation oncologist in [place] is now transferring her care to Dr. Lisbeth Renshaw at [facility] as he felt that his facility in [place] was not equipped enough to handle her treatment needs.  - I will speak with Dr. Marin Olp once he returns next week about her clearance for surgery and transfer of rad/onc care to Dr. Lisbeth Renshaw at [facility]  -She is also waiting for clearance to set  up her appointment for hysterectomy.  Dr. Marin Olp 10/28/2017 -The pathology report ([id number]) unfortunately showed extensive residual disease.  In the right breast, she had a poorly differentiated 8 cm residual tumor.  She had satellite tumors that measured 1.2 cm and 1.1 cm, respectively.  She had 5 of 8 lymph nodes that were positive.  She had one with extranodal extension.  This is a lot more extensive than I thought by clinical exam. -Her tumor is still estrogen receptor negative and progesterone receptor negative.  It is HER-2 positive. -She had expanders placed. -Her ovaries have not been taken out yet.  This will had to be done at a separate procedure. -She is still sore.  She still has limited range of motion of the right shoulder.   Lymphedema issues, if any:     Pain issues, if any:    Vitals Weights  SAFETY ISSUES:  Prior radiation?   Pacemaker/ICD?   Possible current pregnancy? No  Is the patient on methotrexate?    Current Complaints / other details:  Going to PT- ROM in right arm is improving.    Cori Razor, RN 12/02/2017,2:54 PM

## 2017-12-03 ENCOUNTER — Ambulatory Visit
Admission: RE | Admit: 2017-12-03 | Discharge: 2017-12-03 | Disposition: A | Discharge status: Home / Self Care | Payer: Medicaid Other | Source: Ambulatory Visit | Attending: Radiation Oncology | Admitting: Radiation Oncology

## 2017-12-03 ENCOUNTER — Ambulatory Visit: Payer: Medicaid Other

## 2017-12-03 ENCOUNTER — Telehealth: Payer: Self-pay | Admitting: *Deleted

## 2017-12-03 NOTE — Telephone Encounter (Signed)
Left patient a voicemail inquiring if she would make it for her 9:30 appointment with Dr. Lisbeth Renshaw.  Awaiting call back.  Will continue to follow as necessary.  Gloriajean Dell. Leonie Green, BSN

## 2017-12-04 ENCOUNTER — Telehealth: Payer: Self-pay | Admitting: Radiation Oncology

## 2017-12-04 NOTE — Telephone Encounter (Signed)
Left message that our office would like to get her missed consultation appointment with Dr. Lisbeth Renshaw rescheduled.

## 2017-12-07 ENCOUNTER — Encounter: Payer: Self-pay | Admitting: Hematology & Oncology

## 2017-12-18 ENCOUNTER — Inpatient Hospital Stay: Payer: Medicaid Other

## 2017-12-18 ENCOUNTER — Other Ambulatory Visit: Payer: Self-pay

## 2017-12-18 ENCOUNTER — Inpatient Hospital Stay (HOSPITAL_BASED_OUTPATIENT_CLINIC_OR_DEPARTMENT_OTHER): Payer: Medicaid Other | Admitting: Hematology & Oncology

## 2017-12-18 ENCOUNTER — Encounter: Payer: Self-pay | Admitting: Hematology & Oncology

## 2017-12-18 VITALS — BP 115/71 | HR 95 | Temp 98.0°F | Resp 18 | Wt 186.0 lb

## 2017-12-18 DIAGNOSIS — C50911 Malignant neoplasm of unspecified site of right female breast: Secondary | ICD-10-CM

## 2017-12-18 DIAGNOSIS — C50919 Malignant neoplasm of unspecified site of unspecified female breast: Secondary | ICD-10-CM

## 2017-12-18 DIAGNOSIS — Z171 Estrogen receptor negative status [ER-]: Principal | ICD-10-CM

## 2017-12-18 DIAGNOSIS — D500 Iron deficiency anemia secondary to blood loss (chronic): Secondary | ICD-10-CM

## 2017-12-18 DIAGNOSIS — C50011 Malignant neoplasm of nipple and areola, right female breast: Secondary | ICD-10-CM

## 2017-12-18 DIAGNOSIS — Z5112 Encounter for antineoplastic immunotherapy: Secondary | ICD-10-CM | POA: Diagnosis not present

## 2017-12-18 LAB — CBC WITH DIFFERENTIAL (CANCER CENTER ONLY)
BASOS ABS: 0 10*3/uL (ref 0.0–0.1)
Basophils Relative: 0 %
EOS ABS: 0.3 10*3/uL (ref 0.0–0.5)
EOS PCT: 5 %
HCT: 38.1 % (ref 34.8–46.6)
Hemoglobin: 12.4 g/dL (ref 11.6–15.9)
LYMPHS ABS: 2.1 10*3/uL (ref 0.9–3.3)
LYMPHS PCT: 31 %
MCH: 28.9 pg (ref 26.0–34.0)
MCHC: 32.5 g/dL (ref 32.0–36.0)
MCV: 88.8 fL (ref 81.0–101.0)
MONO ABS: 0.5 10*3/uL (ref 0.1–0.9)
Monocytes Relative: 7 %
Neutro Abs: 3.9 10*3/uL (ref 1.5–6.5)
Neutrophils Relative %: 57 %
PLATELETS: 187 10*3/uL (ref 145–400)
RBC: 4.29 MIL/uL (ref 3.70–5.32)
RDW: 14.1 % (ref 11.1–15.7)
WBC: 6.8 10*3/uL (ref 3.9–10.0)

## 2017-12-18 LAB — CMP (CANCER CENTER ONLY)
ALT: 81 U/L — ABNORMAL HIGH (ref 10–47)
ANION GAP: 8 (ref 5–15)
AST: 59 U/L — ABNORMAL HIGH (ref 11–38)
Albumin: 3.7 g/dL (ref 3.5–5.0)
Alkaline Phosphatase: 77 U/L (ref 26–84)
BUN: 12 mg/dL (ref 7–22)
CO2: 29 mmol/L (ref 18–33)
Calcium: 10 mg/dL (ref 8.0–10.3)
Chloride: 102 mmol/L (ref 98–108)
Creatinine: 0.6 mg/dL (ref 0.60–1.20)
GLUCOSE: 115 mg/dL (ref 73–118)
POTASSIUM: 3.5 mmol/L (ref 3.3–4.7)
SODIUM: 139 mmol/L (ref 128–145)
TOTAL PROTEIN: 8.4 g/dL — AB (ref 6.4–8.1)
Total Bilirubin: 0.5 mg/dL (ref 0.2–1.6)

## 2017-12-18 LAB — IRON AND TIBC
Iron: 57 ug/dL (ref 41–142)
SATURATION RATIOS: 20 % — AB (ref 21–57)
TIBC: 279 ug/dL (ref 236–444)
UIBC: 222 ug/dL

## 2017-12-18 LAB — FERRITIN: FERRITIN: 150 ng/mL (ref 11–307)

## 2017-12-18 MED ORDER — SODIUM CHLORIDE 0.9% FLUSH
10.0000 mL | INTRAVENOUS | Status: DC | PRN
  Administered 2017-12-18: 10 mL
  Filled 2017-12-18: qty 10

## 2017-12-18 MED ORDER — LEUPROLIDE ACETATE 7.5 MG IM KIT: 7.5000 mg | PACK | Freq: Once | INTRAMUSCULAR | Status: DC

## 2017-12-18 MED ORDER — DIPHENHYDRAMINE HCL 25 MG PO CAPS: 25.0000 mg | ORAL_CAPSULE | Freq: Once | ORAL | Status: DC

## 2017-12-18 MED ORDER — SODIUM CHLORIDE 0.9 % IV SOLN
Freq: Once | INTRAVENOUS | Status: AC
  Administered 2017-12-18: 11:00:00 via INTRAVENOUS
  Filled 2017-12-18: qty 250

## 2017-12-18 MED ORDER — SODIUM CHLORIDE 0.9 % IV SOLN
3.6000 mg/kg | Freq: Once | INTRAVENOUS | Status: AC
  Administered 2017-12-18: 300 mg via INTRAVENOUS
  Filled 2017-12-18: qty 15

## 2017-12-18 MED ORDER — ACETAMINOPHEN 325 MG PO TABS: 650.0000 mg | ORAL_TABLET | Freq: Once | ORAL | Status: DC

## 2017-12-18 MED ORDER — LEUPROLIDE ACETATE 3.75 MG IM KIT
3.7500 mg | PACK | Freq: Once | INTRAMUSCULAR | Status: AC
  Administered 2017-12-18: 3.75 mg via INTRAMUSCULAR
  Filled 2017-12-18: qty 3.75

## 2017-12-18 MED ORDER — HEPARIN SOD (PORK) LOCK FLUSH 100 UNIT/ML IV SOLN
500.0000 [IU] | Freq: Once | INTRAVENOUS | Status: AC | PRN
  Administered 2017-12-18: 500 [IU]
  Filled 2017-12-18: qty 5

## 2017-12-18 NOTE — Progress Notes (Signed)
Hematology and Oncology Follow Up Visit  Sophia [name] [id number] Dec 15, 1990 27 y.o. 12/18/2017   Principle Diagnosis:  Stage IIIA (T3N2M0) - locally advanced invasive ductal carcinoma of the RIGHT breast - BRCA (+) - ER-/PR-/HER2+  Iron deficiency anemia - blood loss  Past Therapy: Taxotere/Carboplatin/Herceptin/Perjeta -s/p cycle 4 S/P Bilateral mastectomies - 10/09/2017    Ovarian ablation with Lupron - stopped 11/06/2017  Current Therapy:   Kadcyla - adjuvant therapy to started 11/06/2017, s/p cycle 1 Radiation Therapy - PATIENT HAS DECLINED RADIATION THERAPY IV Iron as indicated - last received on 11/06/2017   Interim History:  Sophia [name] is here today for follow-up and treatment.she has really thought long and hard about doing radiation therapy.  She has declined any radiation therapy at this time.  She says that she just is not mentally able to handle radiation.  She is read about the side effects.  She is worried that she we will have permanent long-term complications from radiation therapy.  I talked to her at length about her decision.  I want her to understand that radiation is an essential part of treating her.  Clearly, radiation therapy has shown itself to be highly effective in preventing local and to some degree systemic recurrence.  After neoadjuvant chemotherapy, she still had an 8 cm mass that was removed.  She had 5/8+ lymph nodes with some extranodal extension.  Her risk of local recurrence is incredibly high.  She just does not think that she can handle radiation mentally.  She says she is changing of her diet.  She is trying to exercise more.  She is going to feel better.  She is worried that if she does radiation, she will get very depressed and will not be able to escape from the depressive feelings.  I understand what she is talking about.  I know that she has been through a lot being so young.  I just want her to be around 20 years from now and not have  breast cancer.  She is confident in her decision about not taking radiation.  Otherwise, she seems to be doing okay.  She comes in with her mom.  She is nice and tan after coming back from a beach vacation.  She had a good time.  She was very liberal with using sunscreen.  She still needs to have her ovaries taken out.  She is ready for this now.  She has seen Dr. Sabra Heck before.  I left a message for Dr. Sabra Heck to try to set up the TAH/BSO.  Her appetite is doing better.  Again she is trying to change her diet a little bit.  She does not have swelling in her right arm.  She says that occasionally there is some tingling but no weakness.  Overall, her performance status is ECOG 0.  Of note, she is back at work.     Medications:  Allergies as of 12/18/2017   No Known Allergies     Medication List        Accurate as of 12/18/17  1:11 PM. Always use your most recent med list.          ado-trastuzumab emtansine 100 MG Solr Commonly known as:  KADCYLA Inject 15 mLs (300 mg total) into the vein every 21 ( twenty-one) days.   ferumoxytol 510 mg in sodium chloride 0.9 % 100 mL Inject 510 mg into the vein as needed.   leuprolide 3.75 MG injection Commonly known as:  LUPRON DEPOT (  31-MONTH) Inject 3.75 mg into the muscle every 28 (twenty-eight) days.       Allergies: No Known Allergies  Past Medical History, Surgical history, Social history, and Family History were reviewed and updated.  Review of Systems: Review of Systems  Constitutional: Negative.   HENT: Negative.   Eyes: Negative.   Respiratory: Negative.   Cardiovascular: Negative.   Gastrointestinal: Negative.   Genitourinary: Negative.   Musculoskeletal: Negative.   Skin: Negative.   Neurological: Negative.   Endo/Heme/Allergies: Negative.   Psychiatric/Behavioral: Negative.      Physical Exam:  weight is 186 lb (84.4 kg). Her oral temperature is 98 F (36.7 C). Her blood pressure is 115/71 and her pulse is  95. Her respiration is 18 and oxygen saturation is 100%.   Wt Readings from Last 3 Encounters:  12/18/17 186 lb (84.4 kg)  11/27/17 184 lb (83.5 kg)  11/06/17 185 lb (83.9 kg)    Physical Exam  Constitutional: She is oriented to person, place, and time.  She is well-developed well-nourished.  She has bilateral mastectomies with implants.  She has good healing.  She has no erythema or nodularity on the chest wall around the implants.  There is no bilateral axillary adenopathy.  HENT:  Head: Normocephalic and atraumatic.  Mouth/Throat: Oropharynx is clear and moist.  Eyes: Pupils are equal, round, and reactive to light. EOM are normal.  Neck: Normal range of motion.  Cardiovascular: Normal rate, regular rhythm and normal heart sounds.  Pulmonary/Chest: Effort normal and breath sounds normal.  Abdominal: Soft. Bowel sounds are normal.  Musculoskeletal: Normal range of motion. She exhibits no edema, tenderness or deformity.  Lymphadenopathy:    She has no cervical adenopathy.  Neurological: She is alert and oriented to person, place, and time.  Skin: Skin is warm and dry. No rash noted. No erythema.  Psychiatric: She has a normal mood and affect. [name]. Judgment and thought content normal.  Vitals reviewed.  sh Lab Results  Component Value Date   WBC 6.8 12/18/2017   HGB 12.4 12/18/2017   HCT 38.1 12/18/2017   MCV 88.8 12/18/2017   PLT 187 12/18/2017   Lab Results  Component Value Date   FERRITIN 150 12/18/2017   IRON 57 12/18/2017   TIBC 279 12/18/2017   UIBC 222 12/18/2017   IRONPCTSAT 20 (L) 12/18/2017   Lab Results  Component Value Date   RBC 4.29 12/18/2017   No results found for: KPAFRELGTCHN, LAMBDASER, KAPLAMBRATIO No results found for: IGGSERUM, IGA, IGMSERUM No results found for: Ronnald Ramp, A1GS, A2GS, Violet Baldy, MSPIKE, SPEI   Chemistry      Component Value Date/Time   NA 139 12/18/2017 0930   K 3.5 12/18/2017  0930   CL 102 12/18/2017 0930   CO2 29 12/18/2017 0930   BUN 12 12/18/2017 0930   CREATININE 0.60 12/18/2017 0930      Component Value Date/Time   CALCIUM 10.0 12/18/2017 0930   ALKPHOS 77 12/18/2017 0930   AST 59 (H) 12/18/2017 0930   ALT 81 (H) 12/18/2017 0930   BILITOT 0.5 12/18/2017 0930      Impression and Plan: Sophia [name] is a very pleasant 27 yo caucasian female with locally advanced stage III ductal carcinoma of the right breast, HER-2 positive and ER negative. She is BRCA positive.  She is on maintenance Kadcyla.  This will be quite helpful.  We will have to keep her on this for a year.  I would then  have to consider her for Nerlynx.  She really has put herself at a increased risk for recurrence now that she has decided not to take radiation therapy.  I wish I could convince her about taking radiation.  I think this would clearly be helpful.  She had a large residual primary and 5 lymph nodes.  I realize that she is HER-2 positive which can help but yet, she is quite young.  I think having her ovaries out will help with respect to the BRCA gene positivity.  She will decrease her risk of ovarian cancer.  I spent about 45 minutes with she and her mom.  All the time was spent face-to-face counseling her about her decision to not take radiation and coordinating future care.  This is a very difficult situation.  We will see her back in 3 more weeks.  I left a message for Dr. Sabra Heck so that Dr. Sabra Heck can set her up for the TAH/BSO at any time. Volanda Napoleon, MD 7/26/20191:11 PM

## 2017-12-18 NOTE — Patient Instructions (Addendum)
Ado-Trastuzumab Emtansine for injection What is this medicine? ADO-TRASTUZUMAB EMTANSINE (ADD oh traz TOO zuh mab em TAN zine) is a monoclonal antibody combined with chemotherapy. It is used to treat breast cancer. This medicine may be used for other purposes; ask your health care provider or pharmacist if you have questions. COMMON BRAND NAME(S): Kadcyla What should I tell my health care provider before I take this medicine? They need to know if you have any of these conditions: -heart disease -heart failure -infection (especially a virus infection such as chickenpox, cold sores, or herpes) -liver disease -lung or breathing disease, like asthma -an unusual or allergic reaction to ado-trastuzumab emtansine, other medications, foods, dyes, or preservatives -pregnant or trying to get pregnant -breast-feeding How should I use this medicine? This medicine is for infusion into a vein. It is given by a health care professional in a hospital or clinic setting. Talk to your pediatrician regarding the use of this medicine in children. Special care may be needed. Overdosage: If you think you have taken too much of this medicine contact a poison control center or emergency room at once. NOTE: This medicine is only for you. Do not share this medicine with others. What if I miss a dose? It is important not to miss your dose. Call your doctor or health care professional if you are unable to keep an appointment. What may interact with this medicine? This medicine may also interact with the following medications: -atazanavir -boceprevir -clarithromycin -delavirdine -indinavir -dalfopristin; quinupristin -isoniazid, INH -itraconazole -ketoconazole -nefazodone -nelfinavir -ritonavir -telaprevir -telithromycin -tipranavir -voriconazole This list may not describe all possible interactions. Give your health care provider a list of all the medicines, herbs, non-prescription drugs, or dietary  supplements you use. Also tell them if you smoke, drink alcohol, or use illegal drugs. Some items may interact with your medicine. What should I watch for while using this medicine? Visit your doctor for checks on your progress. This drug may make you feel generally unwell. This is not uncommon, as chemotherapy can affect healthy cells as well as cancer cells. Report any side effects. Continue your course of treatment even though you feel ill unless your doctor tells you to stop. You may need blood work done while you are taking this medicine. Call your doctor or health care professional for advice if you get a fever, chills or sore throat, or other symptoms of a cold or flu. Do not treat yourself. This drug decreases your body's ability to fight infections. Try to avoid being around people who are sick. Be careful brushing and flossing your teeth or using a toothpick because you may get an infection or bleed more easily. If you have any dental work done, tell your dentist you are receiving this medicine. Avoid taking products that contain aspirin, acetaminophen, ibuprofen, naproxen, or ketoprofen unless instructed by your doctor. These medicines may hide a fever. Do not become pregnant while taking this medicine or for 7 months after stopping it, men with female partners should use contraception during treatment and for 4 months after the last dose. Women should inform their doctor if they wish to become pregnant or think they might be pregnant. There is a potential for serious side effects to an unborn child. Do not breast-feed an infant while taking this medicine or for 7 months after the last dose. Men who have a partner who is pregnant or who is capable of becoming pregnant should use a condom during sexual activity while taking this medicine and for   4 months after stopping it. Men should inform their doctors if they wish to father a child. This medicine may lower sperm counts. Talk to your health care  professional or pharmacist for more information. What side effects may I notice from receiving this medicine? Side effects that you should report to your doctor or health care professional as soon as possible: -allergic reactions like skin rash, itching or hives, swelling of the face, lips, or tongue -breathing problems -chest pain or palpitations -fever or chills, sore throat -general ill feeling or flu-like symptoms -light-colored stools -nausea, vomiting -pain, tingling, numbness in the hands or feet -signs and symptoms of bleeding such as bloody or black, tarry stools; red or dark-brown urine; spitting up blood or brown material that looks like coffee grounds; red spots on the skin; unusual bruising or bleeding from the eye, gums, or nose -swelling of the legs or ankles -yellowing of the eyes or skin Side effects that usually do not require medical attention (report to your doctor or health care professional if they continue or are bothersome): -changes in taste -constipation -dizziness -headache -joint pain -muscle pain -trouble sleeping -unusually weak or tired This list may not describe all possible side effects. Call your doctor for medical advice about side effects. You may report side effects to FDA at 1-800-FDA-1088. Where should I keep my medicine? This drug is given in a hospital or clinic and will not be stored at home. NOTE: This sheet is a summary. It may not cover all possible information. If you have questions about this medicine, talk to your doctor, pharmacist, or health care provider.  2018 Elsevier/Gold Standard (2015-07-02 12:11:06) Leuprolide depot injection What is this medicine? LEUPROLIDE (loo PROE lide) is a man-made protein that acts like a natural hormone in the body. It decreases testosterone in men and decreases estrogen in women. In men, this medicine is used to treat advanced prostate cancer. In women, some forms of this medicine may be used to treat  endometriosis, uterine fibroids, or other female hormone-related problems. This medicine may be used for other purposes; ask your health care provider or pharmacist if you have questions. COMMON BRAND NAME(S): Eligard, Lupron Depot, Lupron Depot-Ped, Viadur What should I tell my health care provider before I take this medicine? They need to know if you have any of these conditions: -diabetes -heart disease or previous heart attack -high blood pressure -high cholesterol -mental illness -osteoporosis -pain or difficulty passing urine -seizures -spinal cord metastasis -stroke -suicidal thoughts, plans, or attempt; a previous suicide attempt by you or a family member -tobacco smoker -unusual vaginal bleeding (women) -an unusual or allergic reaction to leuprolide, benzyl alcohol, other medicines, foods, dyes, or preservatives -pregnant or trying to get pregnant -breast-feeding How should I use this medicine? This medicine is for injection into a muscle or for injection under the skin. It is given by a health care professional in a hospital or clinic setting. The specific product will determine how it will be given to you. Make sure you understand which product you receive and how often you will receive it. Talk to your pediatrician regarding the use of this medicine in children. Special care may be needed. Overdosage: If you think you have taken too much of this medicine contact a poison control center or emergency room at once. NOTE: This medicine is only for you. Do not share this medicine with others. What if I miss a dose? It is important not to miss a dose. Call your doctor  or health care professional if you are unable to keep an appointment. Depot injections: Depot injections are given either once-monthly, every 12 weeks, every 16 weeks, or every 24 weeks depending on the product you are prescribed. The product you are prescribed will be based on if you are female or female, and your  condition. Make sure you understand your product and dosing. What may interact with this medicine? Do not take this medicine with any of the following medications: -chasteberry This medicine may also interact with the following medications: -herbal or dietary supplements, like black cohosh or DHEA -female hormones, like estrogens or progestins and birth control pills, patches, rings, or injections -female hormones, like testosterone This list may not describe all possible interactions. Give your health care provider a list of all the medicines, herbs, non-prescription drugs, or dietary supplements you use. Also tell them if you smoke, drink alcohol, or use illegal drugs. Some items may interact with your medicine. What should I watch for while using this medicine? Visit your doctor or health care professional for regular checks on your progress. During the first weeks of treatment, your symptoms may get worse, but then will improve as you continue your treatment. You may get hot flashes, increased bone pain, increased difficulty passing urine, or an aggravation of nerve symptoms. Discuss these effects with your doctor or health care professional, some of them may improve with continued use of this medicine. Female patients may experience a menstrual cycle or spotting during the first months of therapy with this medicine. If this continues, contact your doctor or health care professional. What side effects may I notice from receiving this medicine? Side effects that you should report to your doctor or health care professional as soon as possible: -allergic reactions like skin rash, itching or hives, swelling of the face, lips, or tongue -breathing problems -chest pain -depression or memory disorders -pain in your legs or groin -pain at site where injected or implanted -seizures -severe headache -swelling of the feet and legs -suicidal thoughts or other mood changes -visual  changes -vomiting Side effects that usually do not require medical attention (report to your doctor or health care professional if they continue or are bothersome): -breast swelling or tenderness -decrease in sex drive or performance -diarrhea -hot flashes -loss of appetite -muscle, joint, or bone pains -nausea -redness or irritation at site where injected or implanted -skin problems or acne This list may not describe all possible side effects. Call your doctor for medical advice about side effects. You may report side effects to FDA at 1-800-FDA-1088. Where should I keep my medicine? This drug is given in a hospital or clinic and will not be stored at home. NOTE: This sheet is a summary. It may not cover all possible information. If you have questions about this medicine, talk to your doctor, pharmacist, or health care provider.  2018 Elsevier/Gold Standard (2015-10-25 09:45:53)

## 2017-12-18 NOTE — Progress Notes (Signed)
Reviewed chemistries and CBC with Dr. Marin Olp.Ok to treat per Dr. Marin Olp

## 2017-12-22 ENCOUNTER — Telehealth: Payer: Self-pay | Admitting: *Deleted

## 2017-12-22 DIAGNOSIS — Z1509 Genetic susceptibility to other malignant neoplasm: Principal | ICD-10-CM

## 2017-12-22 DIAGNOSIS — Z1502 Genetic susceptibility to malignant neoplasm of ovary: Principal | ICD-10-CM

## 2017-12-22 DIAGNOSIS — Z1501 Genetic susceptibility to malignant neoplasm of breast: Secondary | ICD-10-CM

## 2017-12-22 LAB — ESTRADIOL, ULTRA SENS: Estradiol, Sensitive: 95.8 pg/mL

## 2017-12-22 NOTE — Telephone Encounter (Signed)
Call to patient. Desires to proceed on 01-19-18.  Surgery instructions reviewed and will provide printed copy at consult appiountment scheduled for 12-24-17.  Surgery 01-19-18 at 0730 at [facility] to provider for review. Will close encounter.

## 2017-12-22 NOTE — Telephone Encounter (Signed)
Call from patient. Has several questions regarding LOS and recovery for procedure. Brief review of expectations provided. Offered office visit to discuss with MD before scheduling. Patient declines. Dates discussed. Patient will consider and call back.,

## 2017-12-22 NOTE — Telephone Encounter (Signed)
Call to patient to follow- up on plans for scheduling surgery. Left message to call back.

## 2017-12-23 ENCOUNTER — Telehealth: Payer: Self-pay | Admitting: *Deleted

## 2017-12-23 NOTE — Telephone Encounter (Signed)
Call to patient. Left message to call back. Need to clarify patient preference for surgery. BSO versus hysterectomy.

## 2017-12-23 NOTE — Telephone Encounter (Signed)
Call from patient. Patient states she is only expecting "partial hysterectomy" involving "tubes and ovaries."  Advised can discuss surgical plan with Dr Sabra Heck at ultrasound tomorrow and will make any changes at that time.   Routing to provider for final review.  Will close encounter.

## 2017-12-24 ENCOUNTER — Other Ambulatory Visit: Payer: Medicaid Other | Admitting: Obstetrics & Gynecology

## 2017-12-24 ENCOUNTER — Other Ambulatory Visit: Payer: Medicaid Other

## 2017-12-24 ENCOUNTER — Telehealth: Payer: Self-pay | Admitting: Obstetrics & Gynecology

## 2017-12-24 ENCOUNTER — Encounter: Payer: Self-pay | Admitting: Obstetrics & Gynecology

## 2017-12-24 NOTE — Telephone Encounter (Signed)
Spoke with patient, offered PUS appt for 12/31/17. Patient has another appointment next week and is unsure of the date, would like to confirm before scheduling, will return call on 8/2 to reschedule PUS.

## 2017-12-24 NOTE — Telephone Encounter (Signed)
Left message to call Mardie Kellen at [phone].  

## 2017-12-24 NOTE — Telephone Encounter (Signed)
Patient cancelled ultrasound for today.  Can you please call to see if it can be rescheduled.  Thanks.

## 2017-12-24 NOTE — Telephone Encounter (Signed)
Patient called at 11:44am to cancel ultrasound appointment for today (12/24/17). Patient stated that she wasn't feeling very good. Advised patient of [facility] fee and she understood and said that she would still like to cancel.   Cc: Triage to reschedule

## 2017-12-25 NOTE — Telephone Encounter (Signed)
Ultrasound and consult has been rescheduled to 12-31-17.

## 2017-12-25 NOTE — Telephone Encounter (Signed)
Spoke with patient, PUS rescheduled to 12/31/17 at 2pm, consult to follow at 2:30pm with Dr. Sabra Heck.   Routing to provider for final review. Patient is agreeable to disposition. Will close encounter.  Cc: Lerry Liner, Magdalene Patricia

## 2017-12-31 ENCOUNTER — Ambulatory Visit (INDEPENDENT_AMBULATORY_CARE_PROVIDER_SITE_OTHER): Payer: Medicaid Other

## 2017-12-31 ENCOUNTER — Other Ambulatory Visit (HOSPITAL_COMMUNITY)
Admission: RE | Admit: 2017-12-31 | Discharge: 2017-12-31 | Disposition: A | Discharge status: Home / Self Care | Payer: Medicaid Other | Source: Ambulatory Visit | Attending: Obstetrics & Gynecology | Admitting: Obstetrics & Gynecology

## 2017-12-31 ENCOUNTER — Ambulatory Visit (INDEPENDENT_AMBULATORY_CARE_PROVIDER_SITE_OTHER): Payer: Medicaid Other | Admitting: Obstetrics & Gynecology

## 2017-12-31 ENCOUNTER — Other Ambulatory Visit: Payer: Self-pay | Admitting: Obstetrics & Gynecology

## 2017-12-31 VITALS — BP 110/72 | HR 88 | Resp 14 | Ht 68.0 in | Wt 191.0 lb

## 2017-12-31 DIAGNOSIS — Z1501 Genetic susceptibility to malignant neoplasm of breast: Secondary | ICD-10-CM | POA: Diagnosis not present

## 2017-12-31 DIAGNOSIS — Z1502 Genetic susceptibility to malignant neoplasm of ovary: Principal | ICD-10-CM

## 2017-12-31 DIAGNOSIS — R9389 Abnormal findings on diagnostic imaging of other specified body structures: Secondary | ICD-10-CM | POA: Diagnosis not present

## 2017-12-31 DIAGNOSIS — Z124 Encounter for screening for malignant neoplasm of cervix: Secondary | ICD-10-CM | POA: Diagnosis not present

## 2017-12-31 DIAGNOSIS — Z1509 Genetic susceptibility to other malignant neoplasm: Secondary | ICD-10-CM

## 2017-12-31 NOTE — Progress Notes (Signed)
27 y.o. Single Caucasian female here for a pelvic ultrasound with sonohystogram due to abnormal appearing endometrium noted on ultrasound.  She was here for ultrasound prior to laparoscopic BSO due to hx of BRCA 1 gene mutation and h/o stage 3 breast cancer.  Pt has undergone mastectomy and had tissue expanders in place.  There has been a little bit of confusion about whether she is having a BSO only or hysterectomy with BSO.  Pt really just desires BSO at this time due to return to work.  No LMP recorded. Patient has had an injection.  Contraception: Abstinence x 1 year  Technique:  Both transabdominal and transvaginal ultrasound examinations of the pelvis were performed. Transabdominal technique was performed for global imaging of the pelvis including uterus, ovaries, adnexal regions, and pelvic cul-de-sac.  It was necessary to proceed with endovaginal exam following the abdominal ultrasound transabdominal exam to visualize the endometrium and adnexa.  Color and duplex Doppler ultrasound was utilized to evaluate blood flow to the ovaries.   FINDINGS: Uterus: 7.7 x 5.3 x 5.1cm Endometrium: 58m, cystic spaced noted Adnexa:  Left: 2.3 x 1.9 x 1.6cm with 1.2 x 12.5IVfollicle     Right: 2.6 x 1.8 x 1.3cm Cul de sac: no free fluid noted  SHSG:  After obtaining appropriate verbal consent from patient, the cervix was visualized using a speculum, and prepped with betadine.  A tenaculum  was applied to the cervix.  Dilation of the cervix was not necessary. The catheter was passed into the uterus and sterile saline introduced, with the following findings: thickened endometrium, no evidence of polyp seen.  Endometrial biopsy recommended. Verbal and written consent obtained.  Speculum placed.  Pap was obtained prior to proceeding with biopsy. Cervix visualized and cleansed with betadine prep.  A single toothed tenaculum was applied to the anterior lip of the cervix.  Endometrial pipelle was advanced through  the cervix into the endometrial cavity without difficulty.  Pipelle passed to 8cm.  Suction applied and pipelle removed with good tissue sample obtained.  Tenculum removed.  No bleeding noted.  Patient tolerated procedure well.  All instruments removed.  Assessment: H/O BRCA 1 gene mutation with plans to remove both tubes and ovaries Thickened and abnormal appearing endometrium today  Plan:   Will await pathology results before ultimately deciding about surgery.  If there is hyperplasia present, she may need additional evaluation prior to surgery.  As well, she is likely to desire hysterectomy.  Lengthy discussion occurred with pt and her mother about the types of hyperplasia dn risks for endometrial cancer.  She is done with childbearing, she is ok with hysterectomy as well.  She is just ready to start getting back to normal and does not desire a long recovery if possible.  Pap obtained today as well.  ~30 minutes spent with patient >50% of time was in face to face discussion of above.

## 2018-01-03 ENCOUNTER — Encounter: Payer: Self-pay | Admitting: Obstetrics & Gynecology

## 2018-01-04 LAB — CYTOLOGY - PAP: Diagnosis: NEGATIVE

## 2018-01-06 ENCOUNTER — Telehealth: Payer: Self-pay | Admitting: Obstetrics & Gynecology

## 2018-01-06 ENCOUNTER — Telehealth: Payer: Self-pay | Admitting: Radiation Oncology

## 2018-01-06 NOTE — Telephone Encounter (Signed)
I have left several message since 12/03/2017 regarding getting this patient's missed consultation appointment with Dr. Lisbeth Renshaw. It need to be reschedule. Patient has not returned any of my phone calls.

## 2018-01-06 NOTE — Telephone Encounter (Signed)
Call to patient. Notified of results as instructed by Dr Sabra Heck.  Patient agrees to Lap BSO with Hysteroscopy/Myosure/ D&C. Surgery on 01-19-18 at 0930. Surgery instruction sheet reviewed. Printed copy will be mailed.   Encounter closed.

## 2018-01-06 NOTE — Telephone Encounter (Signed)
Patient requesting results from biopsy.

## 2018-01-06 NOTE — Telephone Encounter (Signed)
Routing to S. Yeakley, RN.  

## 2018-01-06 NOTE — Telephone Encounter (Signed)
-----   Message from Megan Salon, MD sent at 01/06/2018  7:01 AM EDT ----- Please let pt know her endometrial biopsy is negative for abnormal cells.  Ok to proceed with laparoscopic BSO and I would recommend doing a D&C at the same time due to her thickened endometrium.  She will bleed after the BSO if not done at the same time.  Please adjust time of surgery.    Pap was negative as well.  CC:  Lowell Bouton

## 2018-01-08 ENCOUNTER — Inpatient Hospital Stay: Payer: Medicaid Other

## 2018-01-08 ENCOUNTER — Other Ambulatory Visit: Payer: Self-pay

## 2018-01-08 ENCOUNTER — Inpatient Hospital Stay: Payer: Medicaid Other | Attending: Hematology & Oncology | Admitting: Hematology & Oncology

## 2018-01-08 ENCOUNTER — Encounter: Payer: Self-pay | Admitting: Hematology & Oncology

## 2018-01-08 VITALS — BP 116/65 | HR 96 | Temp 98.4°F | Resp 16 | Wt 189.0 lb

## 2018-01-08 DIAGNOSIS — Z5112 Encounter for antineoplastic immunotherapy: Secondary | ICD-10-CM | POA: Diagnosis present

## 2018-01-08 DIAGNOSIS — Z1501 Genetic susceptibility to malignant neoplasm of breast: Secondary | ICD-10-CM

## 2018-01-08 DIAGNOSIS — C50919 Malignant neoplasm of unspecified site of unspecified female breast: Secondary | ICD-10-CM

## 2018-01-08 DIAGNOSIS — C50111 Malignant neoplasm of central portion of right female breast: Secondary | ICD-10-CM

## 2018-01-08 DIAGNOSIS — Z9013 Acquired absence of bilateral breasts and nipples: Secondary | ICD-10-CM

## 2018-01-08 DIAGNOSIS — C50911 Malignant neoplasm of unspecified site of right female breast: Secondary | ICD-10-CM | POA: Diagnosis not present

## 2018-01-08 DIAGNOSIS — Z1509 Genetic susceptibility to other malignant neoplasm: Secondary | ICD-10-CM

## 2018-01-08 DIAGNOSIS — N951 Menopausal and female climacteric states: Secondary | ICD-10-CM | POA: Diagnosis not present

## 2018-01-08 DIAGNOSIS — Z171 Estrogen receptor negative status [ER-]: Secondary | ICD-10-CM

## 2018-01-08 DIAGNOSIS — D500 Iron deficiency anemia secondary to blood loss (chronic): Secondary | ICD-10-CM

## 2018-01-08 DIAGNOSIS — D509 Iron deficiency anemia, unspecified: Secondary | ICD-10-CM | POA: Diagnosis not present

## 2018-01-08 DIAGNOSIS — C50011 Malignant neoplasm of nipple and areola, right female breast: Secondary | ICD-10-CM

## 2018-01-08 LAB — CBC WITH DIFFERENTIAL (CANCER CENTER ONLY)
Basophils Absolute: 0 10*3/uL (ref 0.0–0.1)
Basophils Relative: 0 %
EOS PCT: 4 %
Eosinophils Absolute: 0.2 10*3/uL (ref 0.0–0.5)
HEMATOCRIT: 37.8 % (ref 34.8–46.6)
Hemoglobin: 12.5 g/dL (ref 11.6–15.9)
LYMPHS ABS: 1.9 10*3/uL (ref 0.9–3.3)
LYMPHS PCT: 31 %
MCH: 29.1 pg (ref 26.0–34.0)
MCHC: 33.1 g/dL (ref 32.0–36.0)
MCV: 87.9 fL (ref 81.0–101.0)
MONO ABS: 0.5 10*3/uL (ref 0.1–0.9)
Monocytes Relative: 8 %
NEUTROS ABS: 3.5 10*3/uL (ref 1.5–6.5)
Neutrophils Relative %: 57 %
PLATELETS: 203 10*3/uL (ref 145–400)
RBC: 4.3 MIL/uL (ref 3.70–5.32)
RDW: 14.3 % (ref 11.1–15.7)
WBC: 6.1 10*3/uL (ref 3.9–10.0)

## 2018-01-08 LAB — IRON AND TIBC
Iron: 53 ug/dL (ref 41–142)
Saturation Ratios: 18 % — ABNORMAL LOW (ref 21–57)
TIBC: 290 ug/dL (ref 236–444)
UIBC: 237 ug/dL

## 2018-01-08 LAB — CMP (CANCER CENTER ONLY)
ALT: 58 U/L — AB (ref 10–47)
ANION GAP: 7 (ref 5–15)
AST: 46 U/L — AB (ref 11–38)
Albumin: 3.8 g/dL (ref 3.5–5.0)
Alkaline Phosphatase: 73 U/L (ref 26–84)
BILIRUBIN TOTAL: 0.7 mg/dL (ref 0.2–1.6)
BUN: 13 mg/dL (ref 7–22)
CALCIUM: 9.6 mg/dL (ref 8.0–10.3)
CO2: 29 mmol/L (ref 18–33)
Chloride: 100 mmol/L (ref 98–108)
Creatinine: 0.8 mg/dL (ref 0.60–1.20)
GLUCOSE: 97 mg/dL (ref 73–118)
POTASSIUM: 3.8 mmol/L (ref 3.3–4.7)
Sodium: 136 mmol/L (ref 128–145)
TOTAL PROTEIN: 8.1 g/dL (ref 6.4–8.1)

## 2018-01-08 LAB — FERRITIN: Ferritin: 89 ng/mL (ref 11–307)

## 2018-01-08 MED ORDER — HEPARIN SOD (PORK) LOCK FLUSH 100 UNIT/ML IV SOLN
500.0000 [IU] | Freq: Once | INTRAVENOUS | Status: AC | PRN
  Administered 2018-01-08: 500 [IU]
  Filled 2018-01-08: qty 5

## 2018-01-08 MED ORDER — SODIUM CHLORIDE 0.9% FLUSH
10.0000 mL | INTRAVENOUS | Status: DC | PRN
  Administered 2018-01-08: 10 mL
  Filled 2018-01-08: qty 10

## 2018-01-08 MED ORDER — SODIUM CHLORIDE 0.9 % IV SOLN
Freq: Once | INTRAVENOUS | Status: AC
  Administered 2018-01-08: 09:00:00 via INTRAVENOUS
  Filled 2018-01-08: qty 250

## 2018-01-08 MED ORDER — ADO-TRASTUZUMAB EMTANSINE CHEMO INJECTION 160 MG
3.6000 mg/kg | Freq: Once | INTRAVENOUS | Status: AC
  Administered 2018-01-08: 300 mg via INTRAVENOUS
  Filled 2018-01-08: qty 15

## 2018-01-08 MED ORDER — DIPHENHYDRAMINE HCL 25 MG PO CAPS: 25.0000 mg | ORAL_CAPSULE | Freq: Once | ORAL | Status: DC

## 2018-01-08 MED ORDER — ACETAMINOPHEN 325 MG PO TABS: 650.0000 mg | ORAL_TABLET | Freq: Once | ORAL | Status: DC

## 2018-01-08 MED ORDER — SODIUM CHLORIDE 0.9 % IJ SOLN
10.0000 mL | Freq: Once | INTRAMUSCULAR | Status: AC
  Administered 2018-01-08: 10 mL
  Filled 2018-01-08: qty 10

## 2018-01-08 NOTE — Progress Notes (Signed)
Hematology and Oncology Follow Up Visit  Sophia [name] [id number] 1990/11/04 27 y.o. 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  The big news is that she will have her oophorectomy on August 27.  I am so glad about this.  I am so thankful for Sophia [name] for doing this.  Sophia [name] really likes Sophia [name].  This is really no surprise.  As far as the breast reconstruction goes, it sounds like this might happen sometime in the fall.  Is her birthday tomorrow.  She will be going out with some friends from work.  Her family will also plan for something for her.  She feels good.  She is had no nausea or vomiting.  She is had no cough.  Is been no change in bowel or bladder habits..  She does have hot flashes.  We have made her menopausal.  Her appetite is pretty good.  She is trying to exercise a little bit more.  Overall, her performance status is ECOG 1.   Medications:  Allergies as of 01/08/2018   No Known Allergies     Medication List        Accurate as of 01/08/18  9:04 AM. Always use your most recent med list.          ado-trastuzumab emtansine 100 MG Solr Commonly known as:  KADCYLA Inject 15 mLs (300 mg total) into the vein every 21 ( twenty-one) days.   ferumoxytol 510 mg in sodium chloride 0.9 % 100 mL Inject 510 mg into the vein as needed.   leuprolide 3.75 MG injection Commonly known as:   LUPRON Inject 3.75 mg into the muscle every 28 (twenty-eight) days.       Allergies: No Known Allergies  Past Medical History, Surgical history, Social history, and Family History were reviewed and updated.  Review of Systems: Review of Systems  Constitutional: Negative.   HENT: Negative.   Eyes: Negative.   Respiratory: Negative.   Cardiovascular: Negative.   Gastrointestinal: Negative.   Genitourinary: Negative.   Musculoskeletal: Negative.   Skin: Negative.   Neurological: Negative.   Endo/Heme/Allergies: Negative.   Psychiatric/Behavioral: Negative.      Physical Exam:  weight is 189 lb (85.7 kg). Her oral temperature is 98.4 F (36.9 C). Her blood pressure is 116/65 and her pulse is 96. Her respiration is 16 and oxygen saturation is 100%.   Wt Readings from Last 3 Encounters:  01/08/18 189 lb (85.7 kg)  12/31/17 191 lb (86.6 kg)  12/18/17 186 lb (84.4 kg)    Physical Exam  Constitutional: She is oriented to person, place, and time.  She is well-developed well-nourished.  She has bilateral mastectomies with implants.  She has good healing.  She has no erythema or nodularity on the chest wall around the implants.  There is no bilateral axillary adenopathy.  HENT:  Head: Normocephalic and atraumatic.  Mouth/Throat: Oropharynx is clear and moist.  Eyes: Pupils are equal, round, and  reactive to light. EOM are normal.  Neck: Normal range of motion.  Cardiovascular: Normal rate, regular rhythm and normal heart sounds.  Pulmonary/Chest: Effort normal and breath sounds normal.  Abdominal: Soft. Bowel sounds are normal.  Musculoskeletal: Normal range of motion. She exhibits no edema, tenderness or deformity.  Lymphadenopathy:    She has no cervical adenopathy.  Neurological: She is alert and oriented to person, place, and time.  Skin: Skin is warm and dry. No rash noted. No erythema.  Psychiatric: She has a normal mood and affect. [name]. Judgment and  thought content normal.  Vitals reviewed.  sh Lab Results  Component Value Date   WBC 6.1 01/08/2018   HGB 12.5 01/08/2018   HCT 37.8 01/08/2018   MCV 87.9 01/08/2018   PLT 203 01/08/2018   Lab Results  Component Value Date   FERRITIN 150 12/18/2017   IRON 57 12/18/2017   TIBC 279 12/18/2017   UIBC 222 12/18/2017   IRONPCTSAT 20 (L) 12/18/2017   Lab Results  Component Value Date   RBC 4.30 01/08/2018   No results found for: KPAFRELGTCHN, LAMBDASER, KAPLAMBRATIO No results found for: IGGSERUM, IGA, IGMSERUM No results found for: Ronnald Ramp, A1GS, A2GS, Tillman Sers, SPEI   Chemistry      Component Value Date/Time   NA 136 01/08/2018 0822   K 3.8 01/08/2018 0822   CL 100 01/08/2018 0822   CO2 29 01/08/2018 0822   BUN 13 01/08/2018 0822   CREATININE 0.80 01/08/2018 0822      Component Value Date/Time   CALCIUM 9.6 01/08/2018 0822   ALKPHOS 73 01/08/2018 0822   AST 46 (H) 01/08/2018 0822   ALT 58 (H) 01/08/2018 0822   BILITOT 0.7 01/08/2018 2951      Impression and Plan: Sophia [name] is a very pleasant 27 yo caucasian female with locally advanced stage III ductal carcinoma of the right breast, HER-2 positive and ER negative. She is BRCA positive.  She is on maintenance Kadcyla.  This will be quite helpful.  We will have to keep her on this for a year.  I would then have to consider her for Nerlynx.  I am so happy that she will enjoy her birthday.  I am even happier that she will have the oophorectomy in 1 week.  We will plan to see her back probably in 4 weeks now.  I want to make sure that she recovers from her surgery and I do not see a problem with delaying her next treatment for 1 week.   Volanda Napoleon, MD 8/16/20199:04 AM

## 2018-01-08 NOTE — Patient Instructions (Addendum)
[facility] Discharge Instructions for Patients Receiving Chemotherapy  Today you received the following chemotherapy agents :  Kadcyla.  To help prevent nausea and vomiting after your treatment, we encourage you to take your nausea medication as prescribed.   If you develop nausea and vomiting that is not controlled by your nausea medication, call the clinic.   BELOW ARE SYMPTOMS THAT SHOULD BE REPORTED IMMEDIATELY:  *FEVER GREATER THAN 100.5 F  *CHILLS WITH OR WITHOUT FEVER  NAUSEA AND VOMITING THAT IS NOT CONTROLLED WITH YOUR NAUSEA MEDICATION  *UNUSUAL SHORTNESS OF BREATH  *UNUSUAL BRUISING OR BLEEDING  TENDERNESS IN MOUTH AND THROAT WITH OR WITHOUT PRESENCE OF ULCERS  *URINARY PROBLEMS  *BOWEL PROBLEMS  UNUSUAL RASH Items with * indicate a potential emergency and should be followed up as soon as possible.  Feel free to call the clinic should you have any questions or concerns. The clinic phone number is [phone].  Please show the CHEMO ALERT CARD at check-in to the Emergency Department and triage nurse.   

## 2018-01-08 NOTE — Progress Notes (Signed)
Patient refused Tylenol and Benadryl as premedication. Patient states the benadryl makes her too sleepy and she is going to work afterward.   Patient refuses to stay for 30 minute post Kadcyla observation. Patient discharged ambulatory without complaints or concerns.

## 2018-01-08 NOTE — Patient Instructions (Signed)
Implanted Port Home Guide An implanted port is a type of central line that is placed under the skin. Central lines are used to provide IV access when treatment or nutrition needs to be given through a person's veins. Implanted ports are used for long-term IV access. An implanted port may be placed because:  You need IV medicine that would be irritating to the small veins in your hands or arms.  You need long-term IV medicines, such as antibiotics.  You need IV nutrition for a long period.  You need frequent blood draws for lab tests.  You need dialysis.  Implanted ports are usually placed in the chest area, but they can also be placed in the upper arm, the abdomen, or the leg. An implanted port has two main parts:  Reservoir. The reservoir is round and will appear as a small, raised area under your skin. The reservoir is the part where a needle is inserted to give medicines or draw blood.  Catheter. The catheter is a thin, flexible tube that extends from the reservoir. The catheter is placed into a large vein. Medicine that is inserted into the reservoir goes into the catheter and then into the vein.  How will I care for my incision site? Do not get the incision site wet. Bathe or shower as directed by your health care provider. How is my port accessed? Special steps must be taken to access the port:  Before the port is accessed, a numbing cream can be placed on the skin. This helps numb the skin over the port site.  Your health care provider uses a sterile technique to access the port. ? Your health care provider must put on a mask and sterile gloves. ? The skin over your port is cleaned carefully with an antiseptic and allowed to dry. ? The port is gently pinched between sterile gloves, and a needle is inserted into the port.  Only "non-coring" port needles should be used to access the port. Once the port is accessed, a blood return should be checked. This helps ensure that the port  is in the vein and is not clogged.  If your port needs to remain accessed for a constant infusion, a clear (transparent) bandage will be placed over the needle site. The bandage and needle will need to be changed every week, or as directed by your health care provider.  Keep the bandage covering the needle clean and dry. Do not get it wet. Follow your health care provider's instructions on how to take a shower or bath while the port is accessed.  If your port does not need to stay accessed, no bandage is needed over the port.  What is flushing? Flushing helps keep the port from getting clogged. Follow your health care provider's instructions on how and when to flush the port. Ports are usually flushed with saline solution or a medicine called heparin. The need for flushing will depend on how the port is used.  If the port is used for intermittent medicines or blood draws, the port will need to be flushed: ? After medicines have been given. ? After blood has been drawn. ? As part of routine maintenance.  If a constant infusion is running, the port may not need to be flushed.  How long will my port stay implanted? The port can stay in for as long as your health care provider thinks it is needed. When it is time for the port to come out, surgery will be   done to remove it. The procedure is similar to the one performed when the port was put in. When should I seek immediate medical care? When you have an implanted port, you should seek immediate medical care if:  You notice a bad smell coming from the incision site.  You have swelling, redness, or drainage at the incision site.  You have more swelling or pain at the port site or the surrounding area.  You have a fever that is not controlled with medicine.  This information is not intended to replace advice given to you by your health care provider. Make sure you discuss any questions you have with your health care provider. Document  Released: 05/12/2005 Document Revised: 10/18/2015 Document Reviewed: 01/17/2013 Elsevier Interactive Patient Education  2017 Elsevier Inc.  

## 2018-01-09 LAB — CANCER ANTIGEN 27.29: CA 27.29: 16.1 U/mL (ref 0.0–38.6)

## 2018-01-11 NOTE — Patient Instructions (Addendum)
Your procedure is scheduled on:  Tuesday, 8/27  Enter through the Main Entrance of [facility] at: 8 am  Pick up the phone at the desk and dial [phone].  Call this number if you have problems the morning of surgery: [phone].  Remember: Do NOT eat food or Do NOT drink clear liquids (including water) after midnight Monday.    Brush your teeth on the day of your surgery.  Take these medicines the morning of surgery with a SIP OF WATER: None  Stop herbal medications, vitamin supplements, Ibuprofen/NSAIDS at this time.  Do NOT wear jewelry (body piercing), metal hair clips/bobby pins, make-up, or nail polish. Do NOT wear lotions, powders, or perfumes.  You may wear deoderant. Do NOT shave for 48 hours prior to surgery. Do NOT bring valuables to the hospital.  Have a responsible adult drive you home and stay with you for 24 hours after your procedure.  Home with [name] cell [phone].

## 2018-01-12 LAB — ESTRADIOL, ULTRA SENS: Estradiol, Sensitive: 6.9 pg/mL

## 2018-01-13 ENCOUNTER — Other Ambulatory Visit: Payer: Self-pay | Admitting: Obstetrics & Gynecology

## 2018-01-18 ENCOUNTER — Encounter (HOSPITAL_COMMUNITY): Payer: Self-pay

## 2018-01-18 ENCOUNTER — Encounter (HOSPITAL_COMMUNITY)
Admission: RE | Admit: 2018-01-18 | Discharge: 2018-01-18 | Disposition: A | Discharge status: Home / Self Care | Payer: Medicaid Other | Source: Ambulatory Visit | Attending: Obstetrics & Gynecology | Admitting: Obstetrics & Gynecology

## 2018-01-18 ENCOUNTER — Other Ambulatory Visit: Payer: Self-pay

## 2018-01-18 DIAGNOSIS — Z01812 Encounter for preprocedural laboratory examination: Secondary | ICD-10-CM | POA: Insufficient documentation

## 2018-01-18 HISTORY — DX: Depression, unspecified: F32.A

## 2018-01-18 HISTORY — DX: Major depressive disorder, single episode, unspecified: F32.9

## 2018-01-18 LAB — CBC
HEMATOCRIT: 37.9 % (ref 36.0–46.0)
HEMOGLOBIN: 12.5 g/dL (ref 12.0–15.0)
MCH: 29.1 pg (ref 26.0–34.0)
MCHC: 33 g/dL (ref 30.0–36.0)
MCV: 88.3 fL (ref 78.0–100.0)
Platelets: 144 10*3/uL — ABNORMAL LOW (ref 150–400)
RBC: 4.29 MIL/uL (ref 3.87–5.11)
RDW: 14.4 % (ref 11.5–15.5)
WBC: 5.9 10*3/uL (ref 4.0–10.5)

## 2018-01-18 LAB — BASIC METABOLIC PANEL
ANION GAP: 9 (ref 5–15)
BUN: 14 mg/dL (ref 6–20)
CHLORIDE: 102 mmol/L (ref 98–111)
CO2: 27 mmol/L (ref 22–32)
CREATININE: 0.86 mg/dL (ref 0.44–1.00)
Calcium: 9.8 mg/dL (ref 8.9–10.3)
GFR calc non Af Amer: >60 mL/min (ref >60)
Glucose, Bld: 89 mg/dL (ref 70–99)
Potassium: 3.9 mmol/L (ref 3.5–5.1)
SODIUM: 138 mmol/L (ref 135–145)

## 2018-01-19 ENCOUNTER — Inpatient Hospital Stay (HOSPITAL_COMMUNITY): Payer: Medicaid Other | Admitting: Certified Registered Nurse Anesthetist

## 2018-01-19 ENCOUNTER — Other Ambulatory Visit: Payer: Self-pay | Admitting: Obstetrics & Gynecology

## 2018-01-19 ENCOUNTER — Ambulatory Visit (HOSPITAL_COMMUNITY)
Admission: RE | Admit: 2018-01-19 | Discharge: 2018-01-19 | Disposition: A | Discharge status: Home / Self Care | Payer: Medicaid Other | Source: Ambulatory Visit | Attending: Obstetrics & Gynecology | Admitting: Obstetrics & Gynecology

## 2018-01-19 ENCOUNTER — Encounter (HOSPITAL_COMMUNITY)
Admission: RE | Disposition: A | Discharge status: Home / Self Care | Payer: Self-pay | Source: Ambulatory Visit | Attending: Obstetrics & Gynecology

## 2018-01-19 ENCOUNTER — Encounter (HOSPITAL_COMMUNITY): Payer: Self-pay | Admitting: *Deleted

## 2018-01-19 DIAGNOSIS — Z9221 Personal history of antineoplastic chemotherapy: Secondary | ICD-10-CM | POA: Diagnosis present

## 2018-01-19 DIAGNOSIS — Z1501 Genetic susceptibility to malignant neoplasm of breast: Secondary | ICD-10-CM

## 2018-01-19 DIAGNOSIS — R9389 Abnormal findings on diagnostic imaging of other specified body structures: Secondary | ICD-10-CM | POA: Diagnosis not present

## 2018-01-19 DIAGNOSIS — Z853 Personal history of malignant neoplasm of breast: Secondary | ICD-10-CM | POA: Insufficient documentation

## 2018-01-19 DIAGNOSIS — Z9013 Acquired absence of bilateral breasts and nipples: Secondary | ICD-10-CM | POA: Diagnosis present

## 2018-01-19 DIAGNOSIS — Z1502 Genetic susceptibility to malignant neoplasm of ovary: Secondary | ICD-10-CM | POA: Diagnosis not present

## 2018-01-19 HISTORY — PX: HYSTEROSCOPY WITH D & C: SHX1775

## 2018-01-19 HISTORY — PX: LAPAROSCOPIC BILATERAL SALPINGO OOPHERECTOMY: SHX5890

## 2018-01-19 LAB — PREGNANCY, URINE: Preg Test, Ur: NEGATIVE

## 2018-01-19 SURGERY — SALPINGO-OOPHORECTOMY, BILATERAL, LAPAROSCOPIC
Anesthesia: General

## 2018-01-19 MED ORDER — ENOXAPARIN SODIUM 40 MG/0.4ML ~~LOC~~ SOLN
SUBCUTANEOUS | Status: AC
  Administered 2018-01-19: 40 mg via SUBCUTANEOUS
  Filled 2018-01-19: qty 0.4

## 2018-01-19 MED ORDER — SODIUM CHLORIDE 0.9 % IJ SOLN
INTRAMUSCULAR | Status: DC | PRN
  Administered 2018-01-19: 10 mL

## 2018-01-19 MED ORDER — 0.9 % SODIUM CHLORIDE (POUR BTL) OPTIME
TOPICAL | Status: DC | PRN
  Administered 2018-01-19: 1000 mL

## 2018-01-19 MED ORDER — ROCURONIUM BROMIDE 100 MG/10ML IV SOLN
INTRAVENOUS | Status: AC
  Filled 2018-01-19: qty 1

## 2018-01-19 MED ORDER — DEXAMETHASONE SODIUM PHOSPHATE 10 MG/ML IJ SOLN
INTRAMUSCULAR | Status: DC | PRN
  Administered 2018-01-19: 10 mg via INTRAVENOUS

## 2018-01-19 MED ORDER — LACTATED RINGERS IV SOLN
INTRAVENOUS | Status: DC
  Administered 2018-01-19 (×2): via INTRAVENOUS

## 2018-01-19 MED ORDER — GLYCOPYRROLATE 0.2 MG/ML IJ SOLN
INTRAMUSCULAR | Status: AC
  Filled 2018-01-19: qty 1

## 2018-01-19 MED ORDER — PHENYLEPHRINE 40 MCG/ML (10ML) SYRINGE FOR IV PUSH (FOR BLOOD PRESSURE SUPPORT)
PREFILLED_SYRINGE | INTRAVENOUS | Status: AC
  Filled 2018-01-19: qty 20

## 2018-01-19 MED ORDER — SODIUM CHLORIDE 0.9 % IV SOLN
2.0000 g | INTRAVENOUS | Status: AC
  Administered 2018-01-19: 2 g via INTRAVENOUS

## 2018-01-19 MED ORDER — FENTANYL CITRATE (PF) 250 MCG/5ML IJ SOLN
INTRAMUSCULAR | Status: AC
  Filled 2018-01-19: qty 5

## 2018-01-19 MED ORDER — SUGAMMADEX SODIUM 200 MG/2ML IV SOLN
INTRAVENOUS | Status: DC | PRN
  Administered 2018-01-19: 200 mg via INTRAVENOUS

## 2018-01-19 MED ORDER — IBUPROFEN 800 MG PO TABS: 800.0000 mg | ORAL_TABLET | Freq: Three times a day (TID) | ORAL | 0 refills | Status: DC | PRN

## 2018-01-19 MED ORDER — SCOPOLAMINE 1 MG/3DAYS TD PT72
1.0000 | MEDICATED_PATCH | Freq: Once | TRANSDERMAL | Status: DC
  Administered 2018-01-19: 1.5 mg via TRANSDERMAL

## 2018-01-19 MED ORDER — PROPOFOL 10 MG/ML IV BOLUS
INTRAVENOUS | Status: AC
  Filled 2018-01-19: qty 20

## 2018-01-19 MED ORDER — SODIUM CHLORIDE 0.9 % IR SOLN
Status: DC | PRN
  Administered 2018-01-19: 3000 mL

## 2018-01-19 MED ORDER — ROCURONIUM BROMIDE 100 MG/10ML IV SOLN
INTRAVENOUS | Status: DC | PRN
  Administered 2018-01-19: 50 mg via INTRAVENOUS

## 2018-01-19 MED ORDER — ENOXAPARIN SODIUM 40 MG/0.4ML ~~LOC~~ SOLN
40.0000 mg | SUBCUTANEOUS | Status: AC
  Administered 2018-01-19: 40 mg via SUBCUTANEOUS

## 2018-01-19 MED ORDER — DEXAMETHASONE SODIUM PHOSPHATE 10 MG/ML IJ SOLN
INTRAMUSCULAR | Status: AC
  Filled 2018-01-19: qty 1

## 2018-01-19 MED ORDER — LACTATED RINGERS IV SOLN: INTRAVENOUS | Status: DC

## 2018-01-19 MED ORDER — METOCLOPRAMIDE HCL 5 MG/ML IJ SOLN: 10.0000 mg | Freq: Once | INTRAMUSCULAR | Status: DC | PRN

## 2018-01-19 MED ORDER — LIDOCAINE HCL (CARDIAC) PF 100 MG/5ML IV SOSY
PREFILLED_SYRINGE | INTRAVENOUS | Status: DC | PRN
  Administered 2018-01-19: 100 mg via INTRAVENOUS

## 2018-01-19 MED ORDER — PHENYLEPHRINE 8 MG IN D5W 100 ML (0.08MG/ML) PREMIX OPTIME
INJECTION | INTRAVENOUS | Status: AC
  Filled 2018-01-19: qty 100

## 2018-01-19 MED ORDER — SODIUM CHLORIDE 0.9 % IJ SOLN
INTRAMUSCULAR | Status: AC
  Filled 2018-01-19: qty 10

## 2018-01-19 MED ORDER — SODIUM CHLORIDE 0.9 % IV SOLN
INTRAVENOUS | Status: AC
  Filled 2018-01-19: qty 2

## 2018-01-19 MED ORDER — LIDOCAINE-EPINEPHRINE 1 %-1:100000 IJ SOLN
INTRAMUSCULAR | Status: AC
  Filled 2018-01-19: qty 1

## 2018-01-19 MED ORDER — FENTANYL CITRATE (PF) 100 MCG/2ML IJ SOLN: 25.0000 ug | INTRAMUSCULAR | Status: DC | PRN

## 2018-01-19 MED ORDER — PROPOFOL 10 MG/ML IV BOLUS
INTRAVENOUS | Status: DC | PRN
  Administered 2018-01-19: 200 mg via INTRAVENOUS

## 2018-01-19 MED ORDER — LIDOCAINE-EPINEPHRINE 1 %-1:100000 IJ SOLN
INTRAMUSCULAR | Status: DC | PRN
  Administered 2018-01-19: 10 mL

## 2018-01-19 MED ORDER — ONDANSETRON HCL 4 MG/2ML IJ SOLN
INTRAMUSCULAR | Status: DC | PRN
  Administered 2018-01-19 (×2): 2 mg via INTRAVENOUS

## 2018-01-19 MED ORDER — LIDOCAINE HCL (CARDIAC) PF 100 MG/5ML IV SOSY
PREFILLED_SYRINGE | INTRAVENOUS | Status: AC
  Filled 2018-01-19: qty 5

## 2018-01-19 MED ORDER — BUPIVACAINE HCL (PF) 0.25 % IJ SOLN
INTRAMUSCULAR | Status: DC | PRN
  Administered 2018-01-19: 8 mL

## 2018-01-19 MED ORDER — MIDAZOLAM HCL 2 MG/2ML IJ SOLN
INTRAMUSCULAR | Status: DC | PRN
  Administered 2018-01-19: 0.5 mg via INTRAVENOUS
  Administered 2018-01-19: 1.5 mg via INTRAVENOUS

## 2018-01-19 MED ORDER — GLYCOPYRROLATE 0.2 MG/ML IJ SOLN
INTRAMUSCULAR | Status: DC | PRN
  Administered 2018-01-19 (×2): 0.1 mg via INTRAVENOUS

## 2018-01-19 MED ORDER — MEPERIDINE HCL 25 MG/ML IJ SOLN: 6.2500 mg | INTRAMUSCULAR | Status: DC | PRN

## 2018-01-19 MED ORDER — KETOROLAC TROMETHAMINE 30 MG/ML IJ SOLN
INTRAMUSCULAR | Status: DC | PRN
  Administered 2018-01-19: 30 mg via INTRAVENOUS

## 2018-01-19 MED ORDER — MIDAZOLAM HCL 2 MG/2ML IJ SOLN
INTRAMUSCULAR | Status: AC
  Filled 2018-01-19: qty 2

## 2018-01-19 MED ORDER — KETOROLAC TROMETHAMINE 30 MG/ML IJ SOLN
INTRAMUSCULAR | Status: AC
  Filled 2018-01-19: qty 1

## 2018-01-19 MED ORDER — FENTANYL CITRATE (PF) 100 MCG/2ML IJ SOLN
INTRAMUSCULAR | Status: DC | PRN
  Administered 2018-01-19 (×2): 100 ug via INTRAVENOUS
  Administered 2018-01-19: 50 ug via INTRAVENOUS

## 2018-01-19 MED ORDER — BUPIVACAINE HCL (PF) 0.25 % IJ SOLN
INTRAMUSCULAR | Status: AC
  Filled 2018-01-19: qty 30

## 2018-01-19 MED ORDER — ONDANSETRON HCL 4 MG/2ML IJ SOLN
INTRAMUSCULAR | Status: AC
  Filled 2018-01-19: qty 2

## 2018-01-19 MED ORDER — SCOPOLAMINE 1 MG/3DAYS TD PT72
MEDICATED_PATCH | TRANSDERMAL | Status: AC
  Administered 2018-01-19: 1.5 mg via TRANSDERMAL
  Filled 2018-01-19: qty 1

## 2018-01-19 SURGICAL SUPPLY — 39 items
CABLE HIGH FREQUENCY MONO STRZ (ELECTRODE) ×4 IMPLANT
CANISTER SUCT 3000ML PPV (MISCELLANEOUS) ×4 IMPLANT
CATH ROBINSON RED A/P 16FR (CATHETERS) ×4 IMPLANT
DERMABOND ADVANCED (GAUZE/BANDAGES/DRESSINGS) ×2
DERMABOND ADVANCED .7 DNX12 (GAUZE/BANDAGES/DRESSINGS) ×2 IMPLANT
DILATOR CANAL MILEX (MISCELLANEOUS) IMPLANT
DRSG OPSITE POSTOP 3X4 (GAUZE/BANDAGES/DRESSINGS) ×4 IMPLANT
DURAPREP 26ML APPLICATOR (WOUND CARE) ×4 IMPLANT
FILTER ARTHROSCOPY CONVERTOR (FILTER) ×4 IMPLANT
GLOVE BIOGEL PI IND STRL 7.0 (GLOVE) ×8 IMPLANT
GLOVE BIOGEL PI INDICATOR 7.0 (GLOVE) ×8
GLOVE ECLIPSE 6.5 STRL STRAW (GLOVE) ×4 IMPLANT
GOWN STRL REUS W/TWL LRG LVL3 (GOWN DISPOSABLE) ×12 IMPLANT
LIGASURE VESSEL 5MM BLUNT TIP (ELECTROSURGICAL) ×4 IMPLANT
NEEDLE INSUFFLATION 120MM (ENDOMECHANICALS) ×4 IMPLANT
PACK LAPAROSCOPY BASIN (CUSTOM PROCEDURE TRAY) ×4 IMPLANT
PACK TRENDGUARD 450 HYBRID PRO (MISCELLANEOUS) ×2 IMPLANT
PACK VAGINAL MINOR WOMEN LF (CUSTOM PROCEDURE TRAY) ×4 IMPLANT
PAD OB MATERNITY 4.3X12.25 (PERSONAL CARE ITEMS) ×4 IMPLANT
POUCH LAPAROSCOPIC INSTRUMENT (MISCELLANEOUS) ×4 IMPLANT
POUCH SPECIMEN RETRIEVAL 10MM (ENDOMECHANICALS) ×4 IMPLANT
PROTECTOR NERVE ULNAR (MISCELLANEOUS) ×8 IMPLANT
SHEARS HARMONIC ACE PLUS 36CM (ENDOMECHANICALS) IMPLANT
SLEEVE ADV FIXATION 5X100MM (TROCAR) ×4 IMPLANT
SLEEVE XCEL OPT CAN 5 100 (ENDOMECHANICALS) ×4 IMPLANT
SUT VICRYL 0 UR6 27IN ABS (SUTURE) ×4 IMPLANT
SUT VICRYL 4-0 PS2 18IN ABS (SUTURE) ×4 IMPLANT
SYR 30ML LL (SYRINGE) ×4 IMPLANT
SYSTEM CARTER THOMASON II (TROCAR) ×4 IMPLANT
TOWEL OR 17X24 6PK STRL BLUE (TOWEL DISPOSABLE) ×8 IMPLANT
TRAY FOLEY W/BAG SLVR 14FR (SET/KITS/TRAYS/PACK) ×4 IMPLANT
TRENDGUARD 450 HYBRID PRO PACK (MISCELLANEOUS) ×4
TROCAR ADV FIXATION 5X100MM (TROCAR) ×4 IMPLANT
TROCAR XCEL NON-BLD 11X100MML (ENDOMECHANICALS) ×4 IMPLANT
TROCAR XCEL NON-BLD 5MMX100MML (ENDOMECHANICALS) ×4 IMPLANT
TUBING AQUILEX INFLOW (TUBING) ×4 IMPLANT
TUBING AQUILEX OUTFLOW (TUBING) ×4 IMPLANT
TUBING INSUF HEATED (TUBING) ×4 IMPLANT
WARMER LAPAROSCOPE (MISCELLANEOUS) ×4 IMPLANT

## 2018-01-19 NOTE — Discharge Instructions (Signed)

## 2018-01-19 NOTE — Anesthesia Procedure Notes (Signed)
Procedure Name: Intubation Date/Time: 01/19/2018 9:40 AM Performed by: Montez Hageman, MD Pre-anesthesia Checklist: Patient identified, Patient being monitored, Timeout performed, Emergency Drugs available and Suction available Patient Re-evaluated:Patient Re-evaluated prior to induction Oxygen Delivery Method: Circle System Utilized Preoxygenation: Pre-oxygenation with 100% oxygen Induction Type: IV induction Ventilation: Mask ventilation without difficulty Laryngoscope Size: Miller and 2 Grade View: Grade II Tube type: Oral Tube size: 7.0 mm Number of attempts: 1 Airway Equipment and Method: stylet Placement Confirmation: ETT inserted through vocal cords under direct vision,  positive ETCO2 and breath sounds checked- equal and bilateral Secured at: 21 cm Tube secured with: Tape Dental Injury: Teeth and Oropharynx as per pre-operative assessment

## 2018-01-19 NOTE — Anesthesia Preprocedure Evaluation (Signed)
Anesthesia Evaluation  Patient identified by MRN, date of birth, ID band Patient awake    Reviewed: Allergy & Precautions, NPO status , Patient's Chart, lab work & pertinent test results  Airway Mallampati: II  TM Distance: >3 FB Neck ROM: Full    Dental no notable dental hx.    Pulmonary neg pulmonary ROS,    Pulmonary exam normal breath sounds clear to auscultation       Cardiovascular negative cardio ROS Normal cardiovascular exam Rhythm:Regular Rate:Normal     Neuro/Psych negative neurological ROS  negative psych ROS   GI/Hepatic negative GI ROS, Neg liver ROS,   Endo/Other  negative endocrine ROS  Renal/GU negative Renal ROS  negative genitourinary   Musculoskeletal negative musculoskeletal ROS (+)   Abdominal   Peds negative pediatric ROS (+)  Hematology negative hematology ROS (+)   Anesthesia Other Findings   Reproductive/Obstetrics negative OB ROS                             Anesthesia Physical Anesthesia Plan  ASA: II  Anesthesia Plan: General   Post-op Pain Management:    Induction: Intravenous  PONV Risk Score and Plan: 4 or greater and Ondansetron, Dexamethasone, Midazolam and Scopolamine patch - Pre-op  Airway Management Planned: Oral ETT  Additional Equipment:   Intra-op Plan:   Post-operative Plan: Extubation in OR  Informed Consent: I have reviewed the patients History and Physical, chart, labs and discussed the procedure including the risks, benefits and alternatives for the proposed anesthesia with the patient or authorized representative who has indicated his/her understanding and acceptance.   Dental advisory given  Plan Discussed with: CRNA  Anesthesia Plan Comments:         Anesthesia Quick Evaluation  

## 2018-01-19 NOTE — Op Note (Signed)
01/19/2018  10:45 AM  PATIENT:  Sophia [name]  27 y.o. female  PRE-OPERATIVE DIAGNOSIS:  BRCA 1 positive, personal history breast cancer thickened endometral lining  POST-OPERATIVE DIAGNOSIS:  BRCA 1 positive, personal history breast cancer thickened   PROCEDURE:  Procedure(s): LAPAROSCOPIC BILATERAL SALPINGO OOPHORECTOMY DILATATION & CURETTAGE/HYSTEROSCOPY  SURGEON:  Megan Salon  ASSISTANTS: Sumner Boast, MD   ANESTHESIA:   general  ESTIMATED BLOOD LOSS: 10 mL  BLOOD ADMINISTERED:none   FLUIDS: 1000 cc LR  UOP: 75cc clear UOPD  SPECIMEN:  Bilateral tubes and ovaries, pelvic washings, endometrial curettings  DISPOSITION OF SPECIMEN:  PATHOLOGY  FINDINGS: normal pelvis, normal upper abdomen, mildly thickened appearing endometrium  DESCRIPTION OF OPERATION: Patient was taken to the operating room.  She is placed in the supine position. SCDs were on her lower extremities and functioning properly. General anesthesia with an LMA was administered without difficulty.   Legs were then placed in the [place] in the low lithotomy position. The legs were lifted to the high lithotomy position and the Betadine prep was used on the inner thighs perineum and vagina x3. Patient was draped in a normal standard fashion. A foley catheter was placed to straight drain.  Clear urine was noted. A bivalve speculum was placed the vagina. The anterior lip of the cervix was grasped with single-tooth tenaculum.  A paracervical block of 1% lidocaine mixed one-to-one with epinephrine (1:100,000 units).  10 cc was used total. The cervix is dilated up to #21 [facility] dilators. The endometrial cavity sounded to 8 cm.   A 2.9 millimeter diagnostic hysteroscope was obtained. Normal saline was used as a hysteroscopic fluid. The hysteroscope was advanced through the endocervical canal into the endometrial cavity. The tubal ostia were noted bilaterally. Additional findings included fluffy appearing endometrium  but no intracavitary lesions were noted.  The hysteroscope was removed. A #1 toothed curette was used to curette the cavity until rough gritty texture was noted in all quadrants.  The fluid deficit was 50 cc. The tenaculum was removed from the anterior lip of the cervix. A hulka clamp was passed through the cervical os and then attached to the anterior lip of the cervix to assisted with uterine manipulation with the next portion of the procedure.  Attention was then turned to the abdomen.  Legs were lowered to the low lithotomy position.  The umbilicus was everted.  A Veress needle was obtained. Syringe of sterile saline was placed on a open Veress needle.  This was passed into the umbilicus until just when the fluid started to drip.  Then low flow CO2 gas was attached the needle and the pneumoperitoneum was achieved without difficulty. Once four liters of gas was in the abdomen the Veress needle was removed and a 5 millimeter non-bladed Optiview trocar and port were passed directly to the abdomen. The laparoscope was then used to confirm intraperitoneal placement. Normal upper abdomen noted.  Pt was placed in Trendelenburg positioning.  Pelvis was normal as well.  Locations for RLQ, and LLQ ports were identified.  0.25% marcaine was used to anesthetize the skin.  86m skin incision was made in the RLQ and an 10-156mport placed without difficulty.  Then a 23m44mkin incision was made and a 23mm66mnbladed trochar and port was placed in the LLQ.  All trochars were removed.    Ureters were identifies.  Washings were obtained.  Attention was turned to the right side. With uterus on stretch the left, the right IP was serially  clamped cauterized and incised using the ligasure device.  Then the mesosalpinx was serially clamped, cauterized and incised.  Then the uteroovarian pedicle was serially clamped, cauterized and incised freeing the specimen.  The specimen was placed in the pelvis.    Attention was turned to the  left side.  The uterus was placed on stretch to the opposite side.  The left IP was serially clamped cauterized and incised using the ligasure device.  Then the mesosalpinx was serially clamped, cauterized and incised.  Then the uteroovarian pedicle was serially clamped, cauterized and incised freeing the specimen.  The specimen was also placed in the pelvic.   An endo catch bag was placed in the pelvis and both specimens were placed in the bag.  This was brought out through the RLQ incision.  The tubes and ovaries were removed.  A cyst on the left ovaries did rupture but this was all contained within the bag.  No pelvic spill occurred.    The pelvis was irrigated.  No bleeding noted.  Then using a Donnamarie Rossetti fascial closure device, the RLQ incision was closed without difficulty.    Pressures were decreased to ensure there was no bleeding.  Pressures were decreased to 56m.  Excellent hemostasis was noted.    At this point, the procedure was ended.  The pneumoperitoneum was relieved.  The patient was taken out of Trendelenburg positioning.  Several deep breaths were given to the patient's trying to any gas the abdomen and finally the midline and LLQ ports were removed.  The skin was then closed with subcuticular stitches of 3-0 Vicryl. The skin was cleansed Dermabond was applied. Attention was then turned the vagina and the hulka clamp was removed.  There was no bleeding from the cervix noted.  The foley was removed from the bladder.  Sponge, lap, needle, and instrument counts were correct x2.  Patient tolerated the procedure very well. She was awakened from anesthesia, extubated and taken to recovery in stable condition.  COUNTS:  YES  PLAN OF CARE: Transfer to PACU

## 2018-01-19 NOTE — Transfer of Care (Signed)
Immediate Anesthesia Transfer of Care Note  Patient: Sophia [name]  Procedure(s) Performed: LAPAROSCOPIC BILATERAL SALPINGO OOPHORECTOMY (Bilateral ) DILATATION & CURETTAGE/HYSTEROSCOPY (N/A )  Patient Location: PACU  Anesthesia Type:General  Level of Consciousness: awake, alert  and oriented  Airway & Oxygen Therapy: Patient Spontanous Breathing and Patient connected to nasal cannula oxygen  Post-op Assessment: Report given to RN, Post -op Vital signs reviewed and stable and Patient moving all extremities X 4  Post vital signs: Reviewed and stable  Last Vitals:  Vitals Value Taken Time  BP 96/60 01/19/2018 11:00 AM  Temp    Pulse 101 01/19/2018 11:03 AM  Resp 17 01/19/2018 11:03 AM  SpO2 100 % 01/19/2018 11:03 AM  Vitals shown include unvalidated device data.  Last Pain:  Vitals:   01/19/18 0758  TempSrc: Oral  PainSc: 0-No pain      Patients Stated Pain Goal: 5 (81/10/31 5945)  Complications: No apparent anesthesia complications

## 2018-01-19 NOTE — H&P (Signed)
Sophia [name] is an 27 y.o. female G2P2 SWF here for laparoscopic BSO due to BRCA1 positive testing that was discovered after being diagnosed with breast cancer.  On pre-op ultrasound, her endometrium was thickened.  Biopsy was performed.  This was negative for abnormal cells.  Due to this finding, hysteroscopy with D&C will also be performed today.  Risks and benefits have been discussed.  There really are no alternatives to her procedure today other than to monitor conservatively and with ultrasounds which I have not recommended.    Pertinent Gynecological History: Menses: amenorrhea Bleeding: none due to ovarian suppression  Contraception: abstinence DES exposure: denies Blood transfusions: none Sexually transmitted diseases: no past history Previous GYN Procedures: none  Last mammogram: abnormal: Stage 3 breast cancer Last pap: normal Date: 12/31/17 OB History: G2, P2   Menstrual History: No LMP recorded. Patient has had an injection.    Past Medical History:  Diagnosis Date  . BRCA1 positive   . Depression    no meds  . Family history of breast cancer   . History of chemotherapy    finished 08/21/2017  . Iron deficiency anemia due to chronic blood loss 10/28/2017  . Mouth ulcers 10/02/2017   related to chemo medication  . Stage II infiltrating ductal breast carcinoma, ER-, right ([place]) 06/11/2017  . SVD (spontaneous vaginal delivery)    x 2    Past Surgical History:  Procedure Laterality Date  . BREAST RECONSTRUCTION WITH PLACEMENT OF TISSUE EXPANDER AND FLEX HD (ACELLULAR HYDRATED DERMIS) Bilateral 10/09/2017   Procedure: BILATERAL BREAST RECONSTRUCTION WITH PLACEMENT OF TISSUE EXPANDER AND FLEX HD (ACELLULAR HYDRATED DERMIS);  Surgeon: [name], MD;  Location: [facility];  Service: Plastics;  Laterality: Bilateral;  . BREAST SURGERY Right    bx   . IR FLUORO GUIDE PORT INSERTION RIGHT  06/18/2017  . IR US GUIDE VASC ACCESS RIGHT  06/18/2017  . MASTECTOMY  MODIFIED RADICAL Right 10/09/2017   Procedure: MASTECTOMY MODIFIED RADICAL;  Surgeon: Fanny Skates, MD;  Location: [facility];  Service: General;  Laterality: Right;  . TOTAL MASTECTOMY Left 10/09/2017   Procedure: LEFT PROPHYLATIC MASTECTOMY;  Surgeon: Fanny Skates, MD;  Location: [facility];  Service: General;  Laterality: Left;    Family History  Problem Relation Age of Onset  . Breast cancer Maternal Grandmother 60       metastatic  . Breast cancer Other     Social History:  reports that she has never smoked. She has never used smokeless tobacco. She reports that she drinks alcohol. She reports that she does not use drugs.  Allergies: No Known Allergies  Medications Prior to Admission  Medication Sig Dispense Refill Last Dose  . ado-trastuzumab emtansine (KADCYLA) 100 MG SOLR Inject 15 mLs (300 mg total) into the vein every 21 ( twenty-one) days. 250 mL 6 Past Month at Unknown time  . ferumoxytol 510 mg in sodium chloride 0.9 % 100 mL Inject 510 mg into the vein as needed. (Patient not taking: Reported on 01/14/2018) 510 mg 12 Not Taking at Unknown time  . leuprolide (LUPRON DEPOT, 29-MONTH,) 3.75 MG injection Inject 3.75 mg into the muscle every 28 (twenty-eight) days. (Patient not taking: Reported on 01/14/2018) 1 each 11 Not Taking at Unknown time    Review of Systems  All other systems reviewed and are negative.   Blood pressure 110/82, pulse 92, temperature 98.1 F (36.7 C), temperature source Oral, resp. rate 16, SpO2 99 %. Physical  Exam  Constitutional: She appears well-developed and well-nourished.  Cardiovascular: Normal rate and regular rhythm.  Respiratory: Effort normal and breath sounds normal.  Skin: Skin is warm and dry.  Psychiatric: She has a normal mood and affect.    Results for orders placed or performed during the hospital encounter of 01/19/18 (from the past 24 hour(s))  Pregnancy, urine     Status: None   Collection  Time: 01/19/18  7:50 AM  Result Value Ref Range   Preg Test, Ur NEGATIVE NEGATIVE    No results found.  Assessment/Plan: 27 yo SWF with BRCA 1 gene mutation, breast cancer, thickened endometrium here for laparoscopic BSO, hysteroscopy with D&C.  Pt here and ready to proceed.  Megan Salon 01/19/2018, 9:19 AM

## 2018-01-19 NOTE — Anesthesia Postprocedure Evaluation (Signed)
Anesthesia Post Note  Patient: Sophia [name]  Procedure(s) Performed: LAPAROSCOPIC BILATERAL SALPINGO OOPHORECTOMY (Bilateral ) DILATATION & CURETTAGE/HYSTEROSCOPY (N/A )     Patient location during evaluation: PACU Anesthesia Type: General Level of consciousness: awake and alert Pain management: pain level controlled Vital Signs Assessment: post-procedure vital signs reviewed and stable Respiratory status: spontaneous breathing, nonlabored ventilation, respiratory function stable and patient connected to nasal cannula oxygen Cardiovascular status: blood pressure returned to baseline and stable Postop Assessment: no apparent nausea or vomiting Anesthetic complications: no    Last Vitals:  Vitals:   01/19/18 1130 01/19/18 1210  BP: 101/69 107/64  Pulse: 90 94  Resp: 11 16  Temp:    SpO2: 96% 96%    Last Pain:  Vitals:   01/19/18 1130  TempSrc:   PainSc: 0-No pain   Pain Goal: Patients Stated Pain Goal: 5 (01/19/18 0758)               Montez Hageman

## 2018-01-20 ENCOUNTER — Encounter (HOSPITAL_COMMUNITY): Payer: Self-pay | Admitting: Obstetrics & Gynecology

## 2018-01-27 ENCOUNTER — Telehealth: Payer: Self-pay | Admitting: *Deleted

## 2018-01-27 NOTE — Telephone Encounter (Signed)
Patient is having right sided pain after surgery.

## 2018-01-27 NOTE — Telephone Encounter (Signed)
Spoke with patient. Patient is s/p lap BSO and D&C on 01/19/18. Patient states she has twisted during her sleep x2 this week and feels a dull cramping pain in right side when this happens. Nausea with pain, resolves after. Pain currently 4/10. Denies fever or chills, felt warm and flushed this morning, did not check temp. Denies bleeding. Normal BM 01/26/18, abdomen soft, no vomiting. Has not taken any meds for pain.   Recommended OV for further evaluation, advised Dr. Sabra Heck is out of the office today, can schedule with covering provider. OV scheduled with Dr. Sabra Heck for 9/5 at 1:45pm. Advised patient to take motrin 800 mg prn for pain as prescribed. Return call to office for earlier appt if symptoms do not resolve or new symptoms develop. ER precautions for [facility] if after hours. Will review with covering provider and return call with any additional recommendations.   Routing to covering provider for final review. Patient is agreeable to disposition. Will close encounter.   Cc: Dr. Sabra Heck

## 2018-01-28 ENCOUNTER — Encounter: Payer: Self-pay | Admitting: Obstetrics & Gynecology

## 2018-01-28 ENCOUNTER — Other Ambulatory Visit: Payer: Self-pay

## 2018-01-28 ENCOUNTER — Ambulatory Visit (INDEPENDENT_AMBULATORY_CARE_PROVIDER_SITE_OTHER): Payer: Medicaid Other | Admitting: Obstetrics & Gynecology

## 2018-01-28 VITALS — BP 124/68 | HR 84 | Resp 14 | Ht 68.0 in | Wt 190.1 lb

## 2018-01-28 DIAGNOSIS — Z1509 Genetic susceptibility to other malignant neoplasm: Secondary | ICD-10-CM

## 2018-01-28 DIAGNOSIS — Z1501 Genetic susceptibility to malignant neoplasm of breast: Secondary | ICD-10-CM

## 2018-01-28 NOTE — Progress Notes (Signed)
Post Operative Visit  Procedure: Laparoscopic Bilateral Salpingo Oophorectomy, Dilatation and Curettage/Hysteroscopy.  Days Post-op: 9  Subjective: Is 9 days post op from laparoscopic BSO.  Reports with rolling over in bed, she's having increased pain on the right side.  Having normal bowel movements.  Voiding normally.  Taking nothing for pain.  Just wanted to make sure everything was normal.  Denies fever.  Has gone back to work. Spotted for about four days post op.  Pathology removed.    Objective: BP 124/68 (BP Location: Left Arm, Patient Position: Sitting, Cuff Size: Normal)   Pulse 84   Resp 14   Ht 5\' 8"  (1.727 m)   Wt 190 lb 1.6 oz (86.2 kg)   BMI 28.90 kg/m   EXAM General: alert and no distress Resp: clear to auscultation bilaterally Cardio: regular rate and rhythm, S1, S2 normal, no murmur, click, rub or gallop GI: soft, non-tender; bowel sounds normal; no masses,  no organomegaly and incision: clean, dry and intact Extremities: extremities normal, atraumatic, no cyanosis or edema Vaginal Bleeding: none  Assessment: s/p laparoscopic BSO and hysteroscopy with D&C  Plan: Precautions given and restrictions reviewed.  Questions answered.  If she continues to improve and pain improves, she does not need additional follow up.  Of course, if anything changes, she is advised to return.

## 2018-01-29 ENCOUNTER — Ambulatory Visit: Payer: Self-pay

## 2018-01-29 ENCOUNTER — Other Ambulatory Visit: Payer: Self-pay

## 2018-01-29 ENCOUNTER — Ambulatory Visit: Payer: Self-pay | Admitting: Hematology & Oncology

## 2018-02-01 ENCOUNTER — Ambulatory Visit: Payer: Medicaid Other | Admitting: Obstetrics & Gynecology

## 2018-02-05 ENCOUNTER — Encounter: Payer: Self-pay | Admitting: Hematology & Oncology

## 2018-02-05 ENCOUNTER — Other Ambulatory Visit: Payer: Self-pay

## 2018-02-05 ENCOUNTER — Inpatient Hospital Stay: Payer: Medicaid Other | Attending: Hematology & Oncology | Admitting: Hematology & Oncology

## 2018-02-05 ENCOUNTER — Inpatient Hospital Stay: Payer: Medicaid Other

## 2018-02-05 VITALS — BP 107/63 | HR 80 | Temp 98.7°F | Resp 18 | Wt 191.0 lb

## 2018-02-05 DIAGNOSIS — Z171 Estrogen receptor negative status [ER-]: Secondary | ICD-10-CM

## 2018-02-05 DIAGNOSIS — Z90710 Acquired absence of both cervix and uterus: Secondary | ICD-10-CM | POA: Diagnosis not present

## 2018-02-05 DIAGNOSIS — Z9013 Acquired absence of bilateral breasts and nipples: Secondary | ICD-10-CM | POA: Insufficient documentation

## 2018-02-05 DIAGNOSIS — C50911 Malignant neoplasm of unspecified site of right female breast: Secondary | ICD-10-CM | POA: Insufficient documentation

## 2018-02-05 DIAGNOSIS — C50111 Malignant neoplasm of central portion of right female breast: Secondary | ICD-10-CM

## 2018-02-05 DIAGNOSIS — Z1509 Genetic susceptibility to other malignant neoplasm: Secondary | ICD-10-CM

## 2018-02-05 DIAGNOSIS — C50011 Malignant neoplasm of nipple and areola, right female breast: Secondary | ICD-10-CM

## 2018-02-05 DIAGNOSIS — D500 Iron deficiency anemia secondary to blood loss (chronic): Secondary | ICD-10-CM

## 2018-02-05 DIAGNOSIS — Z5112 Encounter for antineoplastic immunotherapy: Secondary | ICD-10-CM | POA: Insufficient documentation

## 2018-02-05 DIAGNOSIS — C773 Secondary and unspecified malignant neoplasm of axilla and upper limb lymph nodes: Secondary | ICD-10-CM

## 2018-02-05 DIAGNOSIS — Z1501 Genetic susceptibility to malignant neoplasm of breast: Secondary | ICD-10-CM

## 2018-02-05 LAB — CMP (CANCER CENTER ONLY)
ALT: 39 U/L (ref 10–47)
AST: 35 U/L (ref 11–38)
Albumin: 3.7 g/dL (ref 3.5–5.0)
Alkaline Phosphatase: 77 U/L (ref 26–84)
Anion gap: 7 (ref 5–15)
BUN: 13 mg/dL (ref 7–22)
CO2: 28 mmol/L (ref 18–33)
Calcium: 10.1 mg/dL (ref 8.0–10.3)
Chloride: 104 mmol/L (ref 98–108)
Creatinine: 0.9 mg/dL (ref 0.60–1.20)
GLUCOSE: 140 mg/dL — AB (ref 73–118)
POTASSIUM: 3.6 mmol/L (ref 3.3–4.7)
Sodium: 139 mmol/L (ref 128–145)
TOTAL PROTEIN: 8.4 g/dL — AB (ref 6.4–8.1)
Total Bilirubin: 0.6 mg/dL (ref 0.2–1.6)

## 2018-02-05 LAB — CBC WITH DIFFERENTIAL (CANCER CENTER ONLY)
BASOS ABS: 0 10*3/uL (ref 0.0–0.1)
Basophils Relative: 0 %
EOS ABS: 0.3 10*3/uL (ref 0.0–0.5)
Eosinophils Relative: 5 %
HCT: 38.1 % (ref 34.8–46.6)
Hemoglobin: 12.6 g/dL (ref 11.6–15.9)
Lymphocytes Relative: 36 %
Lymphs Abs: 2.3 10*3/uL (ref 0.9–3.3)
MCH: 29.2 pg (ref 26.0–34.0)
MCHC: 33.1 g/dL (ref 32.0–36.0)
MCV: 88.2 fL (ref 81.0–101.0)
MONO ABS: 0.4 10*3/uL (ref 0.1–0.9)
MONOS PCT: 6 %
NEUTROS ABS: 3.3 10*3/uL (ref 1.5–6.5)
NEUTROS PCT: 53 %
PLATELETS: 181 10*3/uL (ref 145–400)
RBC: 4.32 MIL/uL (ref 3.70–5.32)
RDW: 13.6 % (ref 11.1–15.7)
WBC Count: 6.3 10*3/uL (ref 3.9–10.0)

## 2018-02-05 LAB — IRON AND TIBC
Iron: 56 ug/dL (ref 41–142)
SATURATION RATIOS: 19 % — AB (ref 21–57)
TIBC: 294 ug/dL (ref 236–444)
UIBC: 238 ug/dL

## 2018-02-05 LAB — FERRITIN: Ferritin: 74 ng/mL (ref 11–307)

## 2018-02-05 MED ORDER — SODIUM CHLORIDE 0.9 % IV SOLN
Freq: Once | INTRAVENOUS | Status: AC
  Administered 2018-02-05: 13:00:00 via INTRAVENOUS
  Filled 2018-02-05: qty 250

## 2018-02-05 MED ORDER — SODIUM CHLORIDE 0.9 % IJ SOLN
10.0000 mL | Freq: Once | INTRAMUSCULAR | Status: AC
  Administered 2018-02-05: 10 mL
  Filled 2018-02-05: qty 10

## 2018-02-05 MED ORDER — SODIUM CHLORIDE 0.9 % IV SOLN
3.6000 mg/kg | Freq: Once | INTRAVENOUS | Status: AC
  Administered 2018-02-05: 300 mg via INTRAVENOUS
  Filled 2018-02-05: qty 15

## 2018-02-05 MED ORDER — HEPARIN SOD (PORK) LOCK FLUSH 100 UNIT/ML IV SOLN
500.0000 [IU] | Freq: Once | INTRAVENOUS | Status: AC | PRN
  Administered 2018-02-05: 500 [IU]
  Filled 2018-02-05: qty 5

## 2018-02-05 MED ORDER — DIPHENHYDRAMINE HCL 25 MG PO CAPS: 25.0000 mg | ORAL_CAPSULE | Freq: Once | ORAL | Status: DC

## 2018-02-05 MED ORDER — SODIUM CHLORIDE 0.9% FLUSH
10.0000 mL | INTRAVENOUS | Status: DC | PRN
  Administered 2018-02-05: 10 mL
  Filled 2018-02-05: qty 10

## 2018-02-05 MED ORDER — ACETAMINOPHEN 325 MG PO TABS: 650.0000 mg | ORAL_TABLET | Freq: Once | ORAL | Status: DC

## 2018-02-05 NOTE — Patient Instructions (Signed)
[facility] Discharge Instructions for Patients Receiving Chemotherapy  Today you received the following chemotherapy agents :  Kadcyla.  To help prevent nausea and vomiting after your treatment, we encourage you to take your nausea medication as prescribed.   If you develop nausea and vomiting that is not controlled by your nausea medication, call the clinic.   BELOW ARE SYMPTOMS THAT SHOULD BE REPORTED IMMEDIATELY:  *FEVER GREATER THAN 100.5 F  *CHILLS WITH OR WITHOUT FEVER  NAUSEA AND VOMITING THAT IS NOT CONTROLLED WITH YOUR NAUSEA MEDICATION  *UNUSUAL SHORTNESS OF BREATH  *UNUSUAL BRUISING OR BLEEDING  TENDERNESS IN MOUTH AND THROAT WITH OR WITHOUT PRESENCE OF ULCERS  *URINARY PROBLEMS  *BOWEL PROBLEMS  UNUSUAL RASH Items with * indicate a potential emergency and should be followed up as soon as possible.  Feel free to call the clinic should you have any questions or concerns. The clinic phone number is [phone].  Please show the CHEMO ALERT CARD at check-in to the Emergency Department and triage nurse.   

## 2018-02-05 NOTE — Progress Notes (Signed)
Hematology and Oncology Follow Up Visit  Sophia [name] [id number] January 26, 1991 27 y.o. 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  She did have her hysterectomy with endometrial and ovarian removal.  This is done on 01/19/2018.  The pathology report ([id number]) did not show any malignancy with her ovary or fallopian tubes.  The endometrium looked okay.  She was only hospitalized I think overnight.  She really looks good right now.  She is still in the process of getting the bilateral mastectomy sites "enhanced".  She has expanders in right now.  She sees the reconstructive surgeon in a couple weeks.  Hopefully, they will be able to do a permanent procedure.  It sounds like she is going to have a "deep flap" reconstruction.  She is working.  She is doing well at work.  She is busy at work.  She has had no cough or shortness of breath.  She has had no change in bowel or bladder habits.  She is had some hot flashes.  These seem to be doing a little bit better.  She did not had any leg swelling.  Overall, her performance status is ECOG 1.    Medications:  Allergies as of 02/05/2018   No Known Allergies     Medication List        Accurate as of 02/05/18 12:26 PM. Always use your most recent med list.          ado-trastuzumab emtansine 100 MG Solr Commonly known as:  KADCYLA Inject 15 mLs  (300 mg total) into the vein every 21 ( twenty-one) days.   ibuprofen 800 MG tablet Commonly known as:  ADVIL,MOTRIN Take 1 tablet (800 mg total) by mouth every 8 (eight) hours as needed.       Allergies: No Known Allergies  Past Medical History, Surgical history, Social history, and Family History were reviewed and updated.  Review of Systems: Review of Systems  Constitutional: Negative.   HENT: Negative.   Eyes: Negative.   Respiratory: Negative.   Cardiovascular: Negative.   Gastrointestinal: Negative.   Genitourinary: Negative.   Musculoskeletal: Negative.   Skin: Negative.   Neurological: Negative.   Endo/Heme/Allergies: Negative.   Psychiatric/Behavioral: Negative.      Physical Exam:  weight is 191 lb (86.6 kg). Her oral temperature is 98.7 F (37.1 C). Her blood pressure is 107/63 and her pulse is 80. Her respiration is 18 and oxygen saturation is 100%.   Wt Readings from Last 3 Encounters:  02/05/18 191 lb (86.6 kg)  01/28/18 190 lb 1.6 oz (86.2 kg)  01/18/18 193 lb (87.5 kg)    Physical Exam  Constitutional: She is oriented to person, place, and time.  She is well-developed well-nourished.  She has bilateral mastectomies with implants.  She has good healing.  She has no erythema or nodularity on the chest wall around the implants.  There is no bilateral axillary adenopathy.  HENT:  Head: Normocephalic  and atraumatic.  Mouth/Throat: Oropharynx is clear and moist.  Eyes: Pupils are equal, round, and reactive to light. EOM are normal.  Neck: Normal range of motion.  Cardiovascular: Normal rate, regular rhythm and normal heart sounds.  Pulmonary/Chest: Effort normal and breath sounds normal.  Abdominal: Soft. Bowel sounds are normal.  Abdominal exam shows healing laparoscopy scars.  There is no fluid wave.  There is no guarding or rebound tenderness.  She has no fluid wave.  There is no palpable liver or spleen tip.  Musculoskeletal: Normal range of motion.  She exhibits no edema, tenderness or deformity.  Lymphadenopathy:    She has no cervical adenopathy.  Neurological: She is alert and oriented to person, place, and time.  Skin: Skin is warm and dry. No rash noted. No erythema.  Psychiatric: She has a normal mood and affect. [name]. Judgment and thought content normal.  Vitals reviewed.  sh Lab Results  Component Value Date   WBC 6.3 02/05/2018   HGB 12.6 02/05/2018   HCT 38.1 02/05/2018   MCV 88.2 02/05/2018   PLT 181 02/05/2018   Lab Results  Component Value Date   FERRITIN 89 01/08/2018   IRON 53 01/08/2018   TIBC 290 01/08/2018   UIBC 237 01/08/2018   IRONPCTSAT 18 (L) 01/08/2018   Lab Results  Component Value Date   RBC 4.32 02/05/2018   No results found for: KPAFRELGTCHN, LAMBDASER, KAPLAMBRATIO No results found for: IGGSERUM, IGA, IGMSERUM No results found for: Odetta Pink, SPEI   Chemistry      Component Value Date/Time   NA 139 02/05/2018 1115   K 3.6 02/05/2018 1115   CL 104 02/05/2018 1115   CO2 28 02/05/2018 1115   BUN 13 02/05/2018 1115   CREATININE 0.90 02/05/2018 1115      Component Value Date/Time   CALCIUM 10.1 02/05/2018 1115   ALKPHOS 77 02/05/2018 1115   AST 35 02/05/2018 1115   ALT 39 02/05/2018 1115   BILITOT 0.6 02/05/2018 1115      Impression and Plan: Sophia [name] is a very pleasant 27 yo caucasian female with locally advanced stage III ductal carcinoma of the right breast, HER-2 positive and ER negative. She is BRCA positive.  She is on maintenance Kadcyla.  This will be quite helpful.  We will have to keep her on this for a year.  I would then have to consider her for Nerlynx.  I still wish that she would consider radiation therapy.  She had quite a few positive lymph nodes in the right axilla.  I am glad that she is able to have the hysterectomy.  There is no malignancy or premalignancy in the ovaries or fallopian  tubes.  We will go ahead and continue her on the Kadcyla.  Her last echocardiogram was back in early July.  We probably need to get another one set up for her in October or November.  We will plan to get her back to see [place] in another 3 weeks.   Volanda Napoleon, MD 9/13/201912:26 PM

## 2018-02-05 NOTE — Patient Instructions (Signed)
Implanted Port Home Guide An implanted port is a type of central line that is placed under the skin. Central lines are used to provide IV access when treatment or nutrition needs to be given through a person's veins. Implanted ports are used for long-term IV access. An implanted port may be placed because:  You need IV medicine that would be irritating to the small veins in your hands or arms.  You need long-term IV medicines, such as antibiotics.  You need IV nutrition for a long period.  You need frequent blood draws for lab tests.  You need dialysis.  Implanted ports are usually placed in the chest area, but they can also be placed in the upper arm, the abdomen, or the leg. An implanted port has two main parts:  Reservoir. The reservoir is round and will appear as a small, raised area under your skin. The reservoir is the part where a needle is inserted to give medicines or draw blood.  Catheter. The catheter is a thin, flexible tube that extends from the reservoir. The catheter is placed into a large vein. Medicine that is inserted into the reservoir goes into the catheter and then into the vein.  How will I care for my incision site? Do not get the incision site wet. Bathe or shower as directed by your health care provider. How is my port accessed? Special steps must be taken to access the port:  Before the port is accessed, a numbing cream can be placed on the skin. This helps numb the skin over the port site.  Your health care provider uses a sterile technique to access the port. ? Your health care provider must put on a mask and sterile gloves. ? The skin over your port is cleaned carefully with an antiseptic and allowed to dry. ? The port is gently pinched between sterile gloves, and a needle is inserted into the port.  Only "non-coring" port needles should be used to access the port. Once the port is accessed, a blood return should be checked. This helps ensure that the port  is in the vein and is not clogged.  If your port needs to remain accessed for a constant infusion, a clear (transparent) bandage will be placed over the needle site. The bandage and needle will need to be changed every week, or as directed by your health care provider.  Keep the bandage covering the needle clean and dry. Do not get it wet. Follow your health care provider's instructions on how to take a shower or bath while the port is accessed.  If your port does not need to stay accessed, no bandage is needed over the port.  What is flushing? Flushing helps keep the port from getting clogged. Follow your health care provider's instructions on how and when to flush the port. Ports are usually flushed with saline solution or a medicine called heparin. The need for flushing will depend on how the port is used.  If the port is used for intermittent medicines or blood draws, the port will need to be flushed: ? After medicines have been given. ? After blood has been drawn. ? As part of routine maintenance.  If a constant infusion is running, the port may not need to be flushed.  How long will my port stay implanted? The port can stay in for as long as your health care provider thinks it is needed. When it is time for the port to come out, surgery will be   done to remove it. The procedure is similar to the one performed when the port was put in. When should I seek immediate medical care? When you have an implanted port, you should seek immediate medical care if:  You notice a bad smell coming from the incision site.  You have swelling, redness, or drainage at the incision site.  You have more swelling or pain at the port site or the surrounding area.  You have a fever that is not controlled with medicine.  This information is not intended to replace advice given to you by your health care provider. Make sure you discuss any questions you have with your health care provider. Document  Released: 05/12/2005 Document Revised: 10/18/2015 Document Reviewed: 01/17/2013 Elsevier Interactive Patient Education  2017 Elsevier Inc.  

## 2018-02-06 LAB — VITAMIN D 25 HYDROXY (VIT D DEFICIENCY, FRACTURES): Vit D, 25-Hydroxy: 23.7 ng/mL — ABNORMAL LOW (ref 30.0–100.0)

## 2018-02-08 ENCOUNTER — Telehealth: Payer: Self-pay | Admitting: *Deleted

## 2018-02-08 NOTE — Telephone Encounter (Addendum)
Patient is aware of results. She knows to start vit D 2000 untis daily  ----- Message from Sophia Napoleon, MD sent at 02/06/2018  8:57 AM EDT ----- Call - Vit D is low!!  Have her take 2000 units daily!!  Laurey Arrow

## 2018-02-09 LAB — ESTRADIOL, ULTRA SENS: Estradiol, Sensitive: 4.9 pg/mL

## 2018-02-25 NOTE — Assessment & Plan Note (Addendum)
The patient was started on adjuvant Kadcyla on 11/12/2017.  So far, she has tolerated treatment well without significant side effects. The patient was evaluated by radiation oncology at [facility] and declined adjuvant radiation therapy. Proceed with cycle 6 of Kadcyla today. We will plan for 1 year of adjuvant Kadcyla (ie 14 cycles), and then determine if there is a role for neratinib.  Last echocardiogram done in July 2019; we will order echo to be done prior to the next visit.

## 2018-02-25 NOTE — Assessment & Plan Note (Signed)
Status post THA-BSO on January 19, 2018; no evidence of malignancy.

## 2018-02-26 ENCOUNTER — Inpatient Hospital Stay: Payer: Medicaid Other

## 2018-02-26 ENCOUNTER — Encounter: Payer: Self-pay | Admitting: Hematology

## 2018-02-26 ENCOUNTER — Inpatient Hospital Stay: Payer: Medicaid Other | Attending: Hematology & Oncology | Admitting: Hematology

## 2018-02-26 ENCOUNTER — Other Ambulatory Visit: Payer: Self-pay

## 2018-02-26 VITALS — BP 117/67 | HR 100 | Temp 98.0°F | Resp 20 | Wt 192.0 lb

## 2018-02-26 DIAGNOSIS — C50011 Malignant neoplasm of nipple and areola, right female breast: Secondary | ICD-10-CM

## 2018-02-26 DIAGNOSIS — Z5112 Encounter for antineoplastic immunotherapy: Secondary | ICD-10-CM | POA: Diagnosis not present

## 2018-02-26 DIAGNOSIS — Z5111 Encounter for antineoplastic chemotherapy: Secondary | ICD-10-CM

## 2018-02-26 DIAGNOSIS — Z171 Estrogen receptor negative status [ER-]: Secondary | ICD-10-CM | POA: Insufficient documentation

## 2018-02-26 DIAGNOSIS — Z1501 Genetic susceptibility to malignant neoplasm of breast: Secondary | ICD-10-CM

## 2018-02-26 DIAGNOSIS — Z1509 Genetic susceptibility to other malignant neoplasm: Secondary | ICD-10-CM

## 2018-02-26 DIAGNOSIS — C50111 Malignant neoplasm of central portion of right female breast: Secondary | ICD-10-CM | POA: Insufficient documentation

## 2018-02-26 LAB — CBC WITH DIFFERENTIAL (CANCER CENTER ONLY)
BASOS ABS: 0 10*3/uL (ref 0.0–0.1)
Basophils Relative: 1 %
EOS ABS: 0.3 10*3/uL (ref 0.0–0.5)
Eosinophils Relative: 4 %
HCT: 37.3 % (ref 34.8–46.6)
HEMOGLOBIN: 12.2 g/dL (ref 11.6–15.9)
LYMPHS ABS: 2.3 10*3/uL (ref 0.9–3.3)
LYMPHS PCT: 30 %
MCH: 28.9 pg (ref 26.0–34.0)
MCHC: 32.7 g/dL (ref 32.0–36.0)
MCV: 88.4 fL (ref 81.0–101.0)
Monocytes Absolute: 0.5 10*3/uL (ref 0.1–0.9)
Monocytes Relative: 7 %
Neutro Abs: 4.4 10*3/uL (ref 1.5–6.5)
Neutrophils Relative %: 58 %
PLATELETS: 176 10*3/uL (ref 145–400)
RBC: 4.22 MIL/uL (ref 3.70–5.32)
RDW: 13.3 % (ref 11.1–15.7)
WBC: 7.6 10*3/uL (ref 3.9–10.0)

## 2018-02-26 LAB — CMP (CANCER CENTER ONLY)
ALK PHOS: 82 U/L (ref 26–84)
ALT: 40 U/L (ref 10–47)
AST: 38 U/L (ref 11–38)
Albumin: 3.6 g/dL (ref 3.5–5.0)
Anion gap: 2 — ABNORMAL LOW (ref 5–15)
BUN: 17 mg/dL (ref 7–22)
CO2: 28 mmol/L (ref 18–33)
Calcium: 9.9 mg/dL (ref 8.0–10.3)
Chloride: 109 mmol/L — ABNORMAL HIGH (ref 98–108)
Creatinine: 1 mg/dL (ref 0.60–1.20)
GLUCOSE: 110 mg/dL (ref 73–118)
POTASSIUM: 3.7 mmol/L (ref 3.3–4.7)
SODIUM: 139 mmol/L (ref 128–145)
Total Bilirubin: 0.6 mg/dL (ref 0.2–1.6)
Total Protein: 8 g/dL (ref 6.4–8.1)

## 2018-02-26 LAB — IRON AND TIBC
IRON: 46 ug/dL (ref 41–142)
SATURATION RATIOS: 15 % — AB (ref 21–57)
TIBC: 295 ug/dL (ref 236–444)
UIBC: 249 ug/dL

## 2018-02-26 LAB — FERRITIN: FERRITIN: 67 ng/mL (ref 11–307)

## 2018-02-26 LAB — LACTATE DEHYDROGENASE: LDH: 209 U/L — ABNORMAL HIGH (ref 98–192)

## 2018-02-26 MED ORDER — SODIUM CHLORIDE 0.9 % IV SOLN
Freq: Once | INTRAVENOUS | Status: AC
  Administered 2018-02-26: 10:00:00 via INTRAVENOUS
  Filled 2018-02-26: qty 250

## 2018-02-26 MED ORDER — HEPARIN SOD (PORK) LOCK FLUSH 100 UNIT/ML IV SOLN
500.0000 [IU] | Freq: Once | INTRAVENOUS | Status: AC | PRN
  Administered 2018-02-26: 500 [IU]
  Filled 2018-02-26: qty 5

## 2018-02-26 MED ORDER — SODIUM CHLORIDE 0.9% FLUSH
10.0000 mL | INTRAVENOUS | Status: DC | PRN
  Administered 2018-02-26: 10 mL
  Filled 2018-02-26: qty 10

## 2018-02-26 MED ORDER — ACETAMINOPHEN 325 MG PO TABS: 650.0000 mg | ORAL_TABLET | Freq: Once | ORAL | Status: DC

## 2018-02-26 MED ORDER — DIPHENHYDRAMINE HCL 25 MG PO CAPS: 25.0000 mg | ORAL_CAPSULE | Freq: Once | ORAL | Status: DC

## 2018-02-26 MED ORDER — SODIUM CHLORIDE 0.9 % IV SOLN
3.6000 mg/kg | Freq: Once | INTRAVENOUS | Status: AC
  Administered 2018-02-26: 300 mg via INTRAVENOUS
  Filled 2018-02-26: qty 15

## 2018-02-26 NOTE — Progress Notes (Signed)
[place] OFFICE PROGRESS NOTE  Patient Care Team: Lavella Lemons, [place] as PCP - General (Family Medicine)  ASSESSMENT & PLAN:  Malignant neoplasm of central portion of right breast in female, estrogen receptor negative [facility]) The patient was started on adjuvant Kadcyla on 11/12/2017.  So far, she has tolerated treatment well without significant side effects. The patient was evaluated by radiation oncology at [facility] and declined adjuvant radiation therapy. Proceed with cycle 6 of Kadcyla today. We will plan for 1 year of adjuvant Kadcyla (ie 14 cycles), and then determine if there is a role for neratinib.  Last echocardiogram done in July 2019; we will order echo to be done prior to the next visit.  BRCA1 positive Status post THA-BSO on January 19, 2018; no evidence of malignancy.   Orders Placed This Encounter  Procedures  . CBC with Differential ([place] Only)    Standing Status:   Standing    Number of Occurrences:   10    Standing Expiration Date:   2019-05-11  . CMP ([place] only)    Standing Status:   Standing    Number of Occurrences:   10    Standing Expiration Date:   05-11-2019  . ECHOCARDIOGRAM COMPLETE    Standing Status:   Future    Standing Expiration Date:   05/30/2019    Order Specific Question:   Where should this test be performed    Answer:   [facility]    Order Specific Question:   Perflutren DEFINITY (image enhancing agent) should be administered unless hypersensitivity or allergy exist    Answer:   Administer Perflutren    Order Specific Question:   Other Comments    Answer:   Prefer morning appointment    All questions were answered. The patient knows to call the clinic with any problems, questions or concerns. No barriers to learning was detected.  Return to clinic in 3 weeks for Cycle 7 of Kadcyla and follow-up toxicity checks.   Tish Men, MD 02/26/2018 10:25 AM  CHIEF COMPLAINT: "I am here for my chemo treatment"  INTERVAL  HISTORY: Ms. Sophia [name] returns to clinic for cycle 6 of adjuvant Kadcyla today.  She reports feeling well and denies any new symptoms, such as fever, chills, night sweats, weight loss, or lymphadenopathy.  She is working full-time as a Gaffer.  SUMMARY OF ONCOLOGIC HISTORY:   Malignant neoplasm of central portion of right breast in female, estrogen receptor negative ([place])   05/13/2017 Imaging    Bilateral Diagnostic MMG 1.  Highly suspicious right breast mass measuring up to 3.7 cm 2.  Several additional smaller indeterminate masses in the right breast.   3.  Highly suspicious right axillary lymphadenopathy. 4.  Bilateral breast MRI is recommended after biopsies are performed and the pathology results are obtained to evaluate extent of the disease in the right breast and to adequately evaluate the left breast giving extremely dense fibroglandular tissue.     05/20/2017 Procedure    US-guided biopsy of the right breast and R axillary LN    05/21/2017 Cancer Staging    Staging form: Breast, AJCC 8th Edition - Clinical stage from 05/21/2017: Stage IIIA (cT3, cN2, cM0, G3, ER+, PR-, HER2+) - Signed by Tish Men, MD on 02/25/2018    05/22/2017 Pathology Results    (Right breast needle biopsy, 10:00) invasive ductal carcinoma, grade 3, involving 2 of 2 core biopsy specimens, maximum dimension 3.5 cm (Right breast needle biopsy, 1:00) invasive  ductal carcinoma, grade 3, involving 3 of 3 core biopsy specimens, maximum dimension 0.9 cm (Right axillary lymph node needle biopsy) invasive ductal carcinoma, grade 3, 0.8 cm in maximum dimension.  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  A variant of uncertain significance was also detected in the gene BARD1 c.1409A>G (p.Asn470Ser).  The date of this test report is 06/24/2017.     10/09/2017 Surgery    Bilateral mastectomies     10/09/2017 Pathology Results    Pathology (Accession: [id number]): 1. Breast, simple mastectomy, Left Prophylactic - FIBROCYSTIC CHANGES WITH CALCIFICATIONS. - NO EVIDENCE OF MALIGNANCY. 2. Breast, modified radical mastectomy , Right - POORLY DIFFERENTIATED CARCINOMA WITH TREATMENT EFFECT, 8 CM. - MARGINS NOT INVOLVED. - SATELLITE TUMOR NODULES IN UPPER OUTER AND UPPER INNER QUADRANTS, 1.2 AND 1.1 CM. - METASTATIC CARCINOMA IN FIVE OF EIGHT LYMPH NODES (5/8), LARGEST IS 21 MM WITH EXTRANODAL EXTENSION. 3. Lymph nodes, regional resection, Right Axillary Contents - FOUR BENIGN LYMPH NODES (0/4).  Hormonal profile: ER (0 and 20% in different specimen), PR-, HER2+ by [facility]    10/09/2017 Cancer Staging    Staging form: Breast, AJCC 8th Edition - Pathologic: No Stage Recommended (ypT3, pN2a, cM0, G3, ER+, PR-, HER2+) - Signed by Tish Men, MD on 02/25/2018     11/05/2017 -  Chemotherapy    The patient had ado-trastuzumab emtansine (KADCYLA) 300 mg in sodium chloride 0.9 % 250 mL chemo infusion, 3.6 mg/kg = 300 mg, Intravenous, Once, 6 of 8 cycles Administration: 300 mg (11/06/2017), 300 mg (11/27/2017), 300 mg (02/05/2018), 300 mg (12/18/2017), 300 mg (01/08/2018)  for chemotherapy treatment.      Breast cancer, right ([place])   10/09/2017 Initial Diagnosis    Breast cancer, right ([place])    11/05/2017 -  Chemotherapy    The patient had ado-trastuzumab emtansine (KADCYLA) 300 mg in sodium chloride 0.9 % 250 mL chemo infusion, 3.6 mg/kg = 300 mg, Intravenous, Once, 6 of 8 cycles Administration: 300 mg (11/06/2017), 300 mg (11/27/2017), 300 mg (02/05/2018), 300 mg (12/18/2017), 300 mg (01/08/2018)  for chemotherapy treatment.      REVIEW OF SYSTEMS:   Constitutional: ( - ) fevers, ( - )   chills , ( - ) night sweats Eyes: ( - ) blurriness of vision, ( - ) double vision, ( - ) watery eyes Ears, nose, mouth, throat, and face: ( - ) mucositis, ( - ) sore throat Respiratory: ( - ) cough, ( - ) dyspnea, ( - ) wheezes Cardiovascular: ( - ) palpitation, ( - ) chest discomfort, ( - ) lower extremity swelling Gastrointestinal:  ( - ) nausea, ( - ) heartburn, ( - ) change in bowel habits Skin: ( - ) abnormal skin rashes Lymphatics: ( - ) new lymphadenopathy, ( - ) easy bruising Neurological: ( - ) numbness, ( - ) tingling, ( - ) new weaknesses Behavioral/Psych: ( - ) mood change, ( - ) new changes  All other systems were reviewed with the patient and are negative.  I have reviewed the past medical history, past surgical history, social history and family history with the patient and they are  unchanged from previous note.  ALLERGIES:  has No Known Allergies.  MEDICATIONS:  Current Outpatient Medications  Medication Sig Dispense Refill  . ado-trastuzumab emtansine (KADCYLA) 100 MG SOLR Inject 15 mLs (300 mg total) into the vein every 21 ( twenty-one) days. 250 mL 6  . cholecalciferol (VITAMIN D) 1000 units tablet Take 2,000 Units by mouth daily.    Marland Kitchen ibuprofen (ADVIL,MOTRIN) 800 MG tablet Take 1 tablet (800 mg total) by mouth every 8 (eight) hours as needed. (Patient not taking: Reported on 02/05/2018) 30 tablet 0   No current facility-administered medications for this visit.    Facility-Administered Medications Ordered in Other Visits  Medication Dose Route Frequency Provider Last Rate Last Dose  . acetaminophen (TYLENOL) tablet 650 mg  650 mg Oral Once Volanda Napoleon, MD      . ado-trastuzumab emtansine (KADCYLA) 300 mg in sodium chloride 0.9 % 250 mL chemo infusion  3.6 mg/kg (Treatment Plan Recorded) Intravenous Once Volanda Napoleon, MD      . diphenhydrAMINE (BENADRYL) capsule 25 mg  25 mg Oral Once Volanda Napoleon, MD      . heparin lock flush 100 unit/mL  500 Units  Intracatheter Once PRN Volanda Napoleon, MD      . sodium chloride flush (NS) 0.9 % injection 10 mL  10 mL Intracatheter PRN Volanda Napoleon, MD        PHYSICAL EXAMINATION: ECOG PERFORMANCE STATUS: 0 - Asymptomatic  Vitals:   02/26/18 0943  BP: 117/67  Pulse: 100  Resp: 20  Temp: 98 F (36.7 C)  SpO2: 100%   Filed Weights   02/26/18 0943  Weight: 192 lb (87.1 kg)    GENERAL: alert, no distress and comfortable SKIN: skin color, texture, turgor are normal, no rashes or significant lesions EYES: conjunctiva are pink and non-injected, sclera clear OROPHARYNX: no exudate, no erythema; lips, buccal mucosa, and tongue normal  NECK: supple, non-tender LYMPH:  no palpable lymphadenopathy in the cervical or axillary region LUNGS: clear to auscultation and percussion with normal breathing effort HEART: regular rate & rhythm and no murmurs and no lower extremity edema ABDOMEN: soft, non-tender, non-distended, normal bowel sounds Musculoskeletal: no cyanosis of digits and no clubbing  PSYCH: alert & oriented x 3, fluent speech NEURO: no focal motor/sensory deficits  LABORATORY DATA:  I have reviewed the data as listed    Component Value Date/Time   NA 139 02/26/2018 0916   K 3.7 02/26/2018 0916   CL 109 (H) 02/26/2018 0916   CO2 28 02/26/2018 0916   GLUCOSE 110 02/26/2018 0916   BUN 17 02/26/2018 0916   CREATININE 1.00 02/26/2018 0916   CALCIUM 9.9 02/26/2018 0916   PROT 8.0 02/26/2018 0916   ALBUMIN 3.6 02/26/2018 0916   AST 38 02/26/2018 0916   ALT 40 02/26/2018 0916   ALKPHOS 82 02/26/2018 0916   BILITOT 0.6 02/26/2018 0916   GFRNONAA >60 01/18/2018 0950   GFRAA >60 01/18/2018 0950    No results found for: SPEP, UPEP  Lab Results  Component Value Date   WBC 7.6 02/26/2018   NEUTROABS 4.4 02/26/2018   HGB 12.2 02/26/2018   HCT 37.3 02/26/2018   MCV 88.4 02/26/2018   PLT 176 02/26/2018      Chemistry      Component Value Date/Time   NA 139 02/26/2018 0916    K 3.7 02/26/2018 0916   CL 109 (H) 02/26/2018 0916   CO2 28 02/26/2018 0916   BUN 17 02/26/2018  3361   CREATININE 1.00 02/26/2018 0916      Component Value Date/Time   CALCIUM 9.9 02/26/2018 0916   ALKPHOS 82 02/26/2018 0916   AST 38 02/26/2018 0916   ALT 40 02/26/2018 0916   BILITOT 0.6 02/26/2018 0916

## 2018-03-05 LAB — ESTRADIOL, ULTRA SENS: ESTRADIOL, SENSITIVE: 4 pg/mL

## 2018-03-19 ENCOUNTER — Inpatient Hospital Stay: Payer: Medicaid Other

## 2018-03-19 ENCOUNTER — Inpatient Hospital Stay (HOSPITAL_BASED_OUTPATIENT_CLINIC_OR_DEPARTMENT_OTHER): Payer: Medicaid Other | Admitting: Family

## 2018-03-19 ENCOUNTER — Other Ambulatory Visit: Payer: Self-pay

## 2018-03-19 VITALS — BP 114/70 | HR 76 | Temp 97.8°F | Resp 18 | Wt 192.0 lb

## 2018-03-19 DIAGNOSIS — C50111 Malignant neoplasm of central portion of right female breast: Secondary | ICD-10-CM

## 2018-03-19 DIAGNOSIS — Z5111 Encounter for antineoplastic chemotherapy: Secondary | ICD-10-CM

## 2018-03-19 DIAGNOSIS — D500 Iron deficiency anemia secondary to blood loss (chronic): Secondary | ICD-10-CM

## 2018-03-19 DIAGNOSIS — C50919 Malignant neoplasm of unspecified site of unspecified female breast: Secondary | ICD-10-CM

## 2018-03-19 DIAGNOSIS — C50011 Malignant neoplasm of nipple and areola, right female breast: Secondary | ICD-10-CM

## 2018-03-19 DIAGNOSIS — Z171 Estrogen receptor negative status [ER-]: Principal | ICD-10-CM

## 2018-03-19 DIAGNOSIS — Z5112 Encounter for antineoplastic immunotherapy: Secondary | ICD-10-CM | POA: Diagnosis not present

## 2018-03-19 DIAGNOSIS — N951 Menopausal and female climacteric states: Secondary | ICD-10-CM | POA: Diagnosis not present

## 2018-03-19 LAB — CBC WITH DIFFERENTIAL (CANCER CENTER ONLY)
Abs Immature Granulocytes: 0.02 10*3/uL (ref 0.00–0.07)
BASOS PCT: 1 %
Basophils Absolute: 0 10*3/uL (ref 0.0–0.1)
Eosinophils Absolute: 0.4 10*3/uL (ref 0.0–0.5)
Eosinophils Relative: 5 %
HCT: 38.4 % (ref 36.0–46.0)
Hemoglobin: 12.3 g/dL (ref 12.0–15.0)
Immature Granulocytes: 0 %
Lymphocytes Relative: 33 %
Lymphs Abs: 2.6 10*3/uL (ref 0.7–4.0)
MCH: 27.8 pg (ref 26.0–34.0)
MCHC: 32 g/dL (ref 30.0–36.0)
MCV: 86.7 fL (ref 80.0–100.0)
MONO ABS: 0.6 10*3/uL (ref 0.1–1.0)
Monocytes Relative: 8 %
Neutro Abs: 4.1 10*3/uL (ref 1.7–7.7)
Neutrophils Relative %: 53 %
Platelet Count: 173 10*3/uL (ref 150–400)
RBC: 4.43 MIL/uL (ref 3.87–5.11)
RDW: 12.7 % (ref 11.5–15.5)
WBC Count: 7.7 10*3/uL (ref 4.0–10.5)
nRBC: 0 % (ref 0.0–0.2)

## 2018-03-19 LAB — CMP (CANCER CENTER ONLY)
ALT: 36 U/L (ref 10–47)
ANION GAP: 6 (ref 5–15)
AST: 35 U/L (ref 11–38)
Albumin: 3.8 g/dL (ref 3.5–5.0)
Alkaline Phosphatase: 88 U/L — ABNORMAL HIGH (ref 26–84)
BUN: 15 mg/dL (ref 7–22)
CO2: 27 mmol/L (ref 18–33)
Calcium: 9.9 mg/dL (ref 8.0–10.3)
Chloride: 106 mmol/L (ref 98–108)
Creatinine: 1.1 mg/dL (ref 0.60–1.20)
Glucose, Bld: 105 mg/dL (ref 73–118)
Potassium: 3.7 mmol/L (ref 3.3–4.7)
SODIUM: 139 mmol/L (ref 128–145)
Total Bilirubin: 0.6 mg/dL (ref 0.2–1.6)
Total Protein: 8.2 g/dL — ABNORMAL HIGH (ref 6.4–8.1)

## 2018-03-19 MED ORDER — ACETAMINOPHEN 325 MG PO TABS: 650.0000 mg | ORAL_TABLET | Freq: Once | ORAL | Status: DC

## 2018-03-19 MED ORDER — ACETAMINOPHEN 325 MG PO TABS
ORAL_TABLET | ORAL | Status: AC
  Filled 2018-03-19: qty 2

## 2018-03-19 MED ORDER — SODIUM CHLORIDE 0.9 % IV SOLN
Freq: Once | INTRAVENOUS | Status: AC
  Administered 2018-03-19: 10:00:00 via INTRAVENOUS
  Filled 2018-03-19: qty 250

## 2018-03-19 MED ORDER — SODIUM CHLORIDE 0.9 % IV SOLN
3.6000 mg/kg | Freq: Once | INTRAVENOUS | Status: AC
  Administered 2018-03-19: 300 mg via INTRAVENOUS
  Filled 2018-03-19: qty 15

## 2018-03-19 MED ORDER — DIPHENHYDRAMINE HCL 25 MG PO CAPS: 25.0000 mg | ORAL_CAPSULE | Freq: Once | ORAL | Status: DC

## 2018-03-19 MED ORDER — DIPHENHYDRAMINE HCL 25 MG PO CAPS
ORAL_CAPSULE | ORAL | Status: AC
  Filled 2018-03-19: qty 1

## 2018-03-19 MED ORDER — HEPARIN SOD (PORK) LOCK FLUSH 100 UNIT/ML IV SOLN
500.0000 [IU] | Freq: Once | INTRAVENOUS | Status: AC | PRN
  Administered 2018-03-19: 500 [IU]
  Filled 2018-03-19: qty 5

## 2018-03-19 MED ORDER — SODIUM CHLORIDE 0.9% FLUSH
10.0000 mL | INTRAVENOUS | Status: DC | PRN
  Administered 2018-03-19: 10 mL
  Filled 2018-03-19: qty 10

## 2018-03-19 NOTE — Progress Notes (Addendum)
Hematology and Oncology Follow Up Visit  Sophia [name] [id number] [birth year] 27 y.o. 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 She is doing well but is still having hot flashes 5-10 time a day which she feels can get pretty intense. She is really enjoying the cold weather.  No fever, chills, n/v, cough, rash, dizziness, SOB, chest pain, palpitations, abdominal pain or changes in bowel or bladder habits.  No episodes of bleeding, no bruising or petechiae.  No swelling, tenderness, numbness or tingling in her extremities at this time.  She states that sometimes she feels a little puffy under her arms. No swelling present at this time. No lymphadenopathy noted on exam.  Implant surgery scheduled for 11/5.  She has maintained a good appetite and is staying well hydrated. Her weight is stable.  She is staying busy enjoying her sweet kids and playing soccer with them.   ECOG Performance Status: 1 - Symptomatic but completely ambulatory  Medications:  Allergies as of 03/19/2018   No Known Allergies     Medication List        Accurate as of 03/19/18  9:46 AM. Always use your most recent med list.          ado-trastuzumab emtansine 100 MG Solr Commonly known as:  KADCYLA Inject 15 mLs (300 mg total) into the vein every 21 ( twenty-one) days.       Allergies: No Known Allergies  Past Medical  History, Surgical history, Social history, and Family History were reviewed and updated.  Review of Systems: All other 10 point review of systems is negative.   Physical Exam:  weight is 192 lb (87.1 kg). Her oral temperature is 97.8 F (36.6 C). Her blood pressure is 114/70 and her pulse is 76. Her respiration is 18 and oxygen saturation is 100%.   Wt Readings from Last 3 Encounters:  03/19/18 192 lb (87.1 kg)  02/26/18 192 lb (87.1 kg)  02/05/18 191 lb (86.6 kg)    Ocular: Sclerae unicteric, pupils equal, round and reactive to light Ear-nose-throat: Oropharynx clear, dentition fair Lymphatic: No cervical, supraclavicular or axillary adenopathy Lungs no rales or rhonchi, good excursion bilaterally Heart regular rate and rhythm, no murmur appreciated Abd soft, nontender, positive bowel sounds, no liver or spleen tip palpated on exam, no fluid wave  MSK no focal spinal tenderness, no joint edema Neuro: non-focal, well-oriented, appropriate affect Breasts: Deferred   Lab Results  Component Value Date   WBC 7.7 03/19/2018   HGB 12.3 03/19/2018   HCT 38.4 03/19/2018   MCV 86.7 03/19/2018   PLT 173 03/19/2018   Lab Results  Component Value Date   FERRITIN 67 02/26/2018   IRON 46 02/26/2018   TIBC 295 02/26/2018   UIBC 249 02/26/2018   IRONPCTSAT 15 (L) 02/26/2018   Lab Results  Component Value Date   RBC 4.43 03/19/2018   No results found for: KPAFRELGTCHN, LAMBDASER, KAPLAMBRATIO No results found for: IGGSERUM, IGA, IGMSERUM No results  found for: Odetta Pink, SPEI   Chemistry      Component Value Date/Time   NA 139 02/26/2018 0916   K 3.7 02/26/2018 0916   CL 109 (H) 02/26/2018 0916   CO2 28 02/26/2018 0916   BUN 17 02/26/2018 0916   CREATININE 1.00 02/26/2018 0916      Component Value Date/Time   CALCIUM 9.9 02/26/2018 0916   ALKPHOS 82 02/26/2018 0916   AST 38 02/26/2018 0916   ALT 40 02/26/2018 0916    BILITOT 0.6 02/26/2018 0916      Impression and Plan: Sophia [name] is a very pleasant 27 yo caucasian female with locally advanced stage III ductal carcinoma of the right breast, HER-2 and ER negative. She is BCRA positive.  She continues to tolerate maintenance Kadcyla for 1 year with the possibility of transitioning to Nerlynx after.  She decided not to do Radiation.  She did have a hysterectomy and is not having 5-10 hot flashes a day. She prefers the natural approach and will try [facility] and see if this helps. I checked with our pharmacist Lattie Haw and we were unable to find any contraindication with her trying this.  We will proceed with treatment today as planned.  She will schedule her ECHO before leaving today.  We will see her in another 3 weeks.  She will contact our office with any questions or concerns. We can certainly see him sooner if need be.   Laverna Peace, NP 10/25/20199:46 AM

## 2018-03-19 NOTE — Patient Instructions (Signed)
Ado-Trastuzumab Emtansine for injection What is this medicine? ADO-TRASTUZUMAB EMTANSINE (ADD oh traz TOO zuh mab em TAN zine) is a monoclonal antibody combined with chemotherapy. It is used to treat breast cancer. This medicine may be used for other purposes; ask your health care provider or pharmacist if you have questions. COMMON BRAND NAME(S): Kadcyla What should I tell my health care provider before I take this medicine? They need to know if you have any of these conditions: -heart disease -heart failure -infection (especially a virus infection such as chickenpox, cold sores, or herpes) -liver disease -lung or breathing disease, like asthma -an unusual or allergic reaction to ado-trastuzumab emtansine, other medications, foods, dyes, or preservatives -pregnant or trying to get pregnant -breast-feeding How should I use this medicine? This medicine is for infusion into a vein. It is given by a health care professional in a hospital or clinic setting. Talk to your pediatrician regarding the use of this medicine in children. Special care may be needed. Overdosage: If you think you have taken too much of this medicine contact a poison control center or emergency room at once. NOTE: This medicine is only for you. Do not share this medicine with others. What if I miss a dose? It is important not to miss your dose. Call your doctor or health care professional if you are unable to keep an appointment. What may interact with this medicine? This medicine may also interact with the following medications: -atazanavir -boceprevir -clarithromycin -delavirdine -indinavir -dalfopristin; quinupristin -isoniazid, INH -itraconazole -ketoconazole -nefazodone -nelfinavir -ritonavir -telaprevir -telithromycin -tipranavir -voriconazole This list may not describe all possible interactions. Give your health care provider a list of all the medicines, herbs, non-prescription drugs, or dietary  supplements you use. Also tell them if you smoke, drink alcohol, or use illegal drugs. Some items may interact with your medicine. What should I watch for while using this medicine? Visit your doctor for checks on your progress. This drug may make you feel generally unwell. This is not uncommon, as chemotherapy can affect healthy cells as well as cancer cells. Report any side effects. Continue your course of treatment even though you feel ill unless your doctor tells you to stop. You may need blood work done while you are taking this medicine. Call your doctor or health care professional for advice if you get a fever, chills or sore throat, or other symptoms of a cold or flu. Do not treat yourself. This drug decreases your body's ability to fight infections. Try to avoid being around people who are sick. Be careful brushing and flossing your teeth or using a toothpick because you may get an infection or bleed more easily. If you have any dental work done, tell your dentist you are receiving this medicine. Avoid taking products that contain aspirin, acetaminophen, ibuprofen, naproxen, or ketoprofen unless instructed by your doctor. These medicines may hide a fever. Do not become pregnant while taking this medicine or for 7 months after stopping it, men with female partners should use contraception during treatment and for 4 months after the last dose. Women should inform their doctor if they wish to become pregnant or think they might be pregnant. There is a potential for serious side effects to an unborn child. Do not breast-feed an infant while taking this medicine or for 7 months after the last dose. Men who have a partner who is pregnant or who is capable of becoming pregnant should use a condom during sexual activity while taking this medicine and for   4 months after stopping it. Men should inform their doctors if they wish to father a child. This medicine may lower sperm counts. Talk to your health care  professional or pharmacist for more information. What side effects may I notice from receiving this medicine? Side effects that you should report to your doctor or health care professional as soon as possible: -allergic reactions like skin rash, itching or hives, swelling of the face, lips, or tongue -breathing problems -chest pain or palpitations -fever or chills, sore throat -general ill feeling or flu-like symptoms -light-colored stools -nausea, vomiting -pain, tingling, numbness in the hands or feet -signs and symptoms of bleeding such as bloody or black, tarry stools; red or dark-brown urine; spitting up blood or brown material that looks like coffee grounds; red spots on the skin; unusual bruising or bleeding from the eye, gums, or nose -swelling of the legs or ankles -yellowing of the eyes or skin Side effects that usually do not require medical attention (report to your doctor or health care professional if they continue or are bothersome): -changes in taste -constipation -dizziness -headache -joint pain -muscle pain -trouble sleeping -unusually weak or tired This list may not describe all possible side effects. Call your doctor for medical advice about side effects. You may report side effects to FDA at 1-800-FDA-1088. Where should I keep my medicine? This drug is given in a hospital or clinic and will not be stored at home. NOTE: This sheet is a summary. It may not cover all possible information. If you have questions about this medicine, talk to your doctor, pharmacist, or health care provider.  2018 Elsevier/Gold Standard (2015-07-02 12:11:06)  

## 2018-03-19 NOTE — H&P (Signed)
  Subjective:     Patient ID: Sophia [name] is a 27 y.o. female.  HPI  5 months post op bilateral mastectomies. Patient desires to proceed with implant exchange.  Presented with palpable right breast mass. Per records, MMG showed right breast mass measuring up to 3.7 cm with associated indeterminate masses, suspicious right axillary adenopathy. Per notes, had three biopsies including 10 o clock, 1 o clock and axillary LN- all with IDC, ER/PR-, Her2 +. MRI demonstrated right breast mass occupying most of the central breast encompassing all 4 quadrants measuring 3.3 x 8.0 x 8.0 cm. Mass and enhancement extends to the nipple base. Extensive bulky right axillary adenopathy noted . PET with slight uptake IM nodes.  Completed neoadjuvant chemotherapy 3.29.19. Continues on Herceptin. Final MRI enhancement in the right breast measures approximately 5.0 x 3.3 x 6.4 cm. Axillary lymph nodes are defined, with normal axillary fat separating the individual lymph nodes, the largest individual lymph node in the right axilla measures approximately 1.7 cm. Right IM LN have decreased in size, with 2 lymph nodes visible, each 3 mm.  Final pathology left benign, right 8 cm poorly differentiated carcinoma with satellite tumor nodules in UOQ UIQ, 1.2 and 1.1 cm. 5/12 LN+ with extranodal extension. Margins clear.  The patient was evaluated by radiation oncology at [facility] and declined adjuvant radiation therapy.  Plan for 1 year of adjuvantKadcyla, seen by Dr. Maylon Peppers last week. Underwent TAH.BSO 12/2017.  Mother and MGM with breast ca. Genetics with BRCA1 mutation.  Prior 36 A. Desires C cup. 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 .      Objective:   Physical Exam  Cardiovascular: Normal rate, regular rhythm and normal heart sounds.  Pulmonary/Chest: Effort normal and breath sounds normal.  Abdominal: Soft.   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 Reviewed OP surgery, no drains anticipated, approximate recovery. Reviewed saline vs silicone. Silicone may offer less rippling esp with HCG or capacity filled implant in prepectoral position but would require MRI or [place] surveillance over time. Reviewed with implants she could achieve larger than current volume and projection. With regards to size counseled this is largely guided by chest wall width, counseled will be larger than present in TE. Patient agrees to silicone implants, plan smooth round capacity filled.   Discussed common to offer lipofilling at this surgery. Discussed purpose fat grafting to minimize rippling, thicken mastectomy flaps. Reviewed risks including donor site pain, need for compression, fat necrosis that presents as lumps, need to repeat, variable take graft.I have recommended that we defer any fat grafting at this time; counseled I am concerned any fat necrosis will complicate her clinical exam for recurrence given her large tumor, LN involvement and declined adjuvant RT. Reviewed if necessary this can be done in future. She and mother understand and agree.   Natrelle [id number]-11-T 300 ml tissue expanders placed bilateral,   310 ml total fill volume 330 ml total fill volume  [name], MD [facility] Plastic & Reconstructive Surgery [phone], pin [phone]

## 2018-03-25 ENCOUNTER — Encounter (HOSPITAL_BASED_OUTPATIENT_CLINIC_OR_DEPARTMENT_OTHER): Payer: Self-pay | Admitting: *Deleted

## 2018-03-25 ENCOUNTER — Other Ambulatory Visit: Payer: Self-pay

## 2018-03-25 NOTE — Pre-Procedure Instructions (Signed)
To come pick up CHG soap and Ensure pre-surgery drink 10 oz. - to use CHG in shower night before surgery and AM DOS; to drink Ensure pre-surgery drink by 0630 DOS.

## 2018-03-29 NOTE — Progress Notes (Signed)
Ensure pre surgical drink given to patient with instructions to finish the drink by 0630 DOS. Verbalized understanding.

## 2018-03-30 ENCOUNTER — Encounter (HOSPITAL_BASED_OUTPATIENT_CLINIC_OR_DEPARTMENT_OTHER): Payer: Self-pay | Admitting: Certified Registered Nurse Anesthetist

## 2018-03-30 ENCOUNTER — Ambulatory Visit (HOSPITAL_BASED_OUTPATIENT_CLINIC_OR_DEPARTMENT_OTHER): Payer: Medicaid Other | Admitting: Certified Registered Nurse Anesthetist

## 2018-03-30 ENCOUNTER — Other Ambulatory Visit: Payer: Self-pay

## 2018-03-30 ENCOUNTER — Encounter (HOSPITAL_BASED_OUTPATIENT_CLINIC_OR_DEPARTMENT_OTHER)
Admission: RE | Disposition: A | Discharge status: Home / Self Care | Payer: Self-pay | Source: Ambulatory Visit | Attending: Plastic Surgery

## 2018-03-30 ENCOUNTER — Ambulatory Visit (HOSPITAL_BASED_OUTPATIENT_CLINIC_OR_DEPARTMENT_OTHER)
Admission: RE | Admit: 2018-03-30 | Discharge: 2018-03-30 | Disposition: A | Discharge status: Home / Self Care | Payer: Medicaid Other | Source: Ambulatory Visit | Attending: Plastic Surgery | Admitting: Plastic Surgery

## 2018-03-30 DIAGNOSIS — Z421 Encounter for breast reconstruction following mastectomy: Secondary | ICD-10-CM | POA: Insufficient documentation

## 2018-03-30 DIAGNOSIS — Z9013 Acquired absence of bilateral breasts and nipples: Secondary | ICD-10-CM | POA: Insufficient documentation

## 2018-03-30 DIAGNOSIS — Z803 Family history of malignant neoplasm of breast: Secondary | ICD-10-CM | POA: Diagnosis not present

## 2018-03-30 DIAGNOSIS — Z1501 Genetic susceptibility to malignant neoplasm of breast: Secondary | ICD-10-CM | POA: Insufficient documentation

## 2018-03-30 DIAGNOSIS — Z853 Personal history of malignant neoplasm of breast: Secondary | ICD-10-CM | POA: Diagnosis not present

## 2018-03-30 DIAGNOSIS — Z9221 Personal history of antineoplastic chemotherapy: Secondary | ICD-10-CM | POA: Diagnosis not present

## 2018-03-30 HISTORY — DX: Presence of other vascular implants and grafts: Z95.828

## 2018-03-30 HISTORY — DX: Personal history of malignant neoplasm of breast: Z85.3

## 2018-03-30 HISTORY — PX: REMOVAL OF BILATERAL TISSUE EXPANDERS WITH PLACEMENT OF BILATERAL BREAST IMPLANTS: SHX6431

## 2018-03-30 SURGERY — REMOVAL, TISSUE EXPANDER, BREAST, BILATERAL, WITH BILATERAL IMPLANT IMPLANT INSERTION
Anesthesia: General | Site: Breast | Laterality: Bilateral

## 2018-03-30 MED ORDER — PHENYLEPHRINE 40 MCG/ML (10ML) SYRINGE FOR IV PUSH (FOR BLOOD PRESSURE SUPPORT)
PREFILLED_SYRINGE | INTRAVENOUS | Status: AC
  Filled 2018-03-30: qty 10

## 2018-03-30 MED ORDER — LIDOCAINE 2% (20 MG/ML) 5 ML SYRINGE
INTRAMUSCULAR | Status: AC
  Filled 2018-03-30: qty 5

## 2018-03-30 MED ORDER — FENTANYL CITRATE (PF) 100 MCG/2ML IJ SOLN: 50.0000 ug | INTRAMUSCULAR | Status: DC | PRN

## 2018-03-30 MED ORDER — GABAPENTIN 300 MG PO CAPS
ORAL_CAPSULE | ORAL | Status: AC
  Filled 2018-03-30: qty 1

## 2018-03-30 MED ORDER — FENTANYL CITRATE (PF) 100 MCG/2ML IJ SOLN: 25.0000 ug | INTRAMUSCULAR | Status: DC | PRN

## 2018-03-30 MED ORDER — ACETAMINOPHEN 500 MG PO TABS
1000.0000 mg | ORAL_TABLET | ORAL | Status: AC
  Administered 2018-03-30: 1000 mg via ORAL

## 2018-03-30 MED ORDER — GABAPENTIN 300 MG PO CAPS
300.0000 mg | ORAL_CAPSULE | ORAL | Status: AC
  Administered 2018-03-30: 300 mg via ORAL

## 2018-03-30 MED ORDER — CEFAZOLIN SODIUM-DEXTROSE 2-4 GM/100ML-% IV SOLN
2.0000 g | INTRAVENOUS | Status: AC
  Administered 2018-03-30: 2 g via INTRAVENOUS

## 2018-03-30 MED ORDER — POVIDONE-IODINE 10 % EX SOLN
CUTANEOUS | Status: DC | PRN
  Administered 2018-03-30: 1 via TOPICAL

## 2018-03-30 MED ORDER — PROPOFOL 10 MG/ML IV BOLUS
INTRAVENOUS | Status: DC | PRN
  Administered 2018-03-30: 200 mg via INTRAVENOUS

## 2018-03-30 MED ORDER — DEXAMETHASONE SODIUM PHOSPHATE 10 MG/ML IJ SOLN
INTRAMUSCULAR | Status: AC
  Filled 2018-03-30: qty 1

## 2018-03-30 MED ORDER — FENTANYL CITRATE (PF) 100 MCG/2ML IJ SOLN
INTRAMUSCULAR | Status: DC | PRN
  Administered 2018-03-30 (×4): 25 ug via INTRAVENOUS

## 2018-03-30 MED ORDER — OXYCODONE HCL 5 MG PO TABS: 5.0000 mg | ORAL_TABLET | Freq: Once | ORAL | Status: DC | PRN

## 2018-03-30 MED ORDER — MIDAZOLAM HCL 5 MG/5ML IJ SOLN
INTRAMUSCULAR | Status: DC | PRN
  Administered 2018-03-30: 2 mg via INTRAVENOUS

## 2018-03-30 MED ORDER — PHENYLEPHRINE 40 MCG/ML (10ML) SYRINGE FOR IV PUSH (FOR BLOOD PRESSURE SUPPORT)
PREFILLED_SYRINGE | INTRAVENOUS | Status: DC | PRN
  Administered 2018-03-30 (×3): 80 ug via INTRAVENOUS
  Administered 2018-03-30: 40 ug via INTRAVENOUS

## 2018-03-30 MED ORDER — LIDOCAINE 2% (20 MG/ML) 5 ML SYRINGE
INTRAMUSCULAR | Status: DC | PRN
  Administered 2018-03-30: 100 mg via INTRAVENOUS

## 2018-03-30 MED ORDER — CELECOXIB 200 MG PO CAPS
ORAL_CAPSULE | ORAL | Status: AC
  Filled 2018-03-30: qty 1

## 2018-03-30 MED ORDER — DEXAMETHASONE SODIUM PHOSPHATE 10 MG/ML IJ SOLN
INTRAMUSCULAR | Status: DC | PRN
  Administered 2018-03-30: 10 mg via INTRAVENOUS

## 2018-03-30 MED ORDER — CEFAZOLIN SODIUM-DEXTROSE 2-4 GM/100ML-% IV SOLN
INTRAVENOUS | Status: AC
  Filled 2018-03-30: qty 100

## 2018-03-30 MED ORDER — LACTATED RINGERS IV SOLN
INTRAVENOUS | Status: DC
  Administered 2018-03-30 (×2): via INTRAVENOUS

## 2018-03-30 MED ORDER — MIDAZOLAM HCL 2 MG/2ML IJ SOLN: 1.0000 mg | INTRAMUSCULAR | Status: DC | PRN

## 2018-03-30 MED ORDER — ACETAMINOPHEN 500 MG PO TABS
ORAL_TABLET | ORAL | Status: AC
  Filled 2018-03-30: qty 2

## 2018-03-30 MED ORDER — ACETAMINOPHEN 10 MG/ML IV SOLN: 1000.0000 mg | Freq: Once | INTRAVENOUS | Status: DC | PRN

## 2018-03-30 MED ORDER — 0.9 % SODIUM CHLORIDE (POUR BTL) OPTIME
TOPICAL | Status: DC | PRN
  Administered 2018-03-30: 500 mL

## 2018-03-30 MED ORDER — MIDAZOLAM HCL 2 MG/2ML IJ SOLN
INTRAMUSCULAR | Status: AC
  Filled 2018-03-30: qty 2

## 2018-03-30 MED ORDER — PROMETHAZINE HCL 25 MG/ML IJ SOLN: 6.2500 mg | INTRAMUSCULAR | Status: DC | PRN

## 2018-03-30 MED ORDER — SCOPOLAMINE 1 MG/3DAYS TD PT72: 1.0000 | MEDICATED_PATCH | Freq: Once | TRANSDERMAL | Status: DC | PRN

## 2018-03-30 MED ORDER — CHLORHEXIDINE GLUCONATE CLOTH 2 % EX PADS: 6.0000 | MEDICATED_PAD | Freq: Once | CUTANEOUS | Status: DC

## 2018-03-30 MED ORDER — SULFAMETHOXAZOLE-TRIMETHOPRIM 800-160 MG PO TABS: 1.0000 | ORAL_TABLET | Freq: Two times a day (BID) | ORAL | 0 refills | Status: DC

## 2018-03-30 MED ORDER — ONDANSETRON HCL 4 MG/2ML IJ SOLN
INTRAMUSCULAR | Status: AC
  Filled 2018-03-30: qty 4

## 2018-03-30 MED ORDER — CELECOXIB 200 MG PO CAPS
200.0000 mg | ORAL_CAPSULE | ORAL | Status: AC
  Administered 2018-03-30: 200 mg via ORAL

## 2018-03-30 MED ORDER — PROPOFOL 10 MG/ML IV BOLUS
INTRAVENOUS | Status: AC
  Filled 2018-03-30: qty 20

## 2018-03-30 MED ORDER — OXYCODONE HCL 5 MG/5ML PO SOLN: 5.0000 mg | Freq: Once | ORAL | Status: DC | PRN

## 2018-03-30 MED ORDER — SODIUM CHLORIDE 0.9 % IV SOLN
INTRAVENOUS | Status: DC | PRN
  Administered 2018-03-30: 1000 mL

## 2018-03-30 MED ORDER — FENTANYL CITRATE (PF) 100 MCG/2ML IJ SOLN
INTRAMUSCULAR | Status: AC
  Filled 2018-03-30: qty 2

## 2018-03-30 MED ORDER — ONDANSETRON HCL 4 MG/2ML IJ SOLN
INTRAMUSCULAR | Status: DC | PRN
  Administered 2018-03-30: 4 mg via INTRAVENOUS

## 2018-03-30 SURGICAL SUPPLY — 71 items
BAG DECANTER FOR FLEXI CONT (MISCELLANEOUS) ×3 IMPLANT
BINDER BREAST 3XL (GAUZE/BANDAGES/DRESSINGS) IMPLANT
BINDER BREAST LRG (GAUZE/BANDAGES/DRESSINGS) IMPLANT
BINDER BREAST MEDIUM (GAUZE/BANDAGES/DRESSINGS) IMPLANT
BINDER BREAST XLRG (GAUZE/BANDAGES/DRESSINGS) ×3 IMPLANT
BINDER BREAST XXLRG (GAUZE/BANDAGES/DRESSINGS) IMPLANT
BLADE SURG 10 STRL SS (BLADE) ×6 IMPLANT
BNDG GAUZE ELAST 4 BULKY (GAUZE/BANDAGES/DRESSINGS) ×6 IMPLANT
CANISTER SUCT 1200ML W/VALVE (MISCELLANEOUS) ×6 IMPLANT
CHLORAPREP W/TINT 26ML (MISCELLANEOUS) ×6 IMPLANT
COVER BACK TABLE 60X90IN (DRAPES) ×3 IMPLANT
COVER MAYO STAND STRL (DRAPES) ×3 IMPLANT
COVER WAND RF STERILE (DRAPES) IMPLANT
DECANTER SPIKE VIAL GLASS SM (MISCELLANEOUS) IMPLANT
DERMABOND ADVANCED (GAUZE/BANDAGES/DRESSINGS) ×4
DERMABOND ADVANCED .7 DNX12 (GAUZE/BANDAGES/DRESSINGS) ×2 IMPLANT
DRAIN CHANNEL 15F RND FF W/TCR (WOUND CARE) IMPLANT
DRAPE TOP ARMCOVERS (MISCELLANEOUS) ×3 IMPLANT
DRAPE U-SHAPE 76X120 STRL (DRAPES) ×3 IMPLANT
DRSG PAD ABDOMINAL 8X10 ST (GAUZE/BANDAGES/DRESSINGS) ×6 IMPLANT
ELECT BLADE 4.0 EZ CLEAN MEGAD (MISCELLANEOUS) ×3
ELECT COATED BLADE 2.86 ST (ELECTRODE) ×3 IMPLANT
ELECT REM PT RETURN 9FT ADLT (ELECTROSURGICAL) ×3
ELECTRODE BLDE 4.0 EZ CLN MEGD (MISCELLANEOUS) ×1 IMPLANT
ELECTRODE REM PT RTRN 9FT ADLT (ELECTROSURGICAL) ×1 IMPLANT
EVACUATOR SILICONE 100CC (DRAIN) IMPLANT
GLOVE BIO SURGEON STRL SZ 6 (GLOVE) ×6 IMPLANT
GLOVE BIOGEL PI IND STRL 7.0 (GLOVE) ×1 IMPLANT
GLOVE BIOGEL PI IND STRL 7.5 (GLOVE) ×1 IMPLANT
GLOVE BIOGEL PI INDICATOR 7.0 (GLOVE) ×2
GLOVE BIOGEL PI INDICATOR 7.5 (GLOVE) ×2
GLOVE SURG SYN 7.5  E (GLOVE) ×2
GLOVE SURG SYN 7.5 E (GLOVE) ×1 IMPLANT
GOWN STRL REUS W/ TWL LRG LVL3 (GOWN DISPOSABLE) ×1 IMPLANT
GOWN STRL REUS W/ TWL XL LVL3 (GOWN DISPOSABLE) ×1 IMPLANT
GOWN STRL REUS W/TWL LRG LVL3 (GOWN DISPOSABLE) ×2
GOWN STRL REUS W/TWL XL LVL3 (GOWN DISPOSABLE) ×2
IMPL BREAST SILICONE 470CC (Breast) ×2 IMPLANT
IMPLANT BREAST SILICONE 470CC (Breast) ×6 IMPLANT
IV NS 500ML (IV SOLUTION)
IV NS 500ML BAXH (IV SOLUTION) IMPLANT
KIT FILL SYSTEM UNIVERSAL (SET/KITS/TRAYS/PACK) IMPLANT
MARKER SKIN DUAL TIP RULER LAB (MISCELLANEOUS) IMPLANT
NEEDLE HYPO 18GX1.5 BLUNT FILL (NEEDLE) ×3 IMPLANT
NEEDLE HYPO 25X1 1.5 SAFETY (NEEDLE) IMPLANT
PACK BASIN DAY SURGERY FS (CUSTOM PROCEDURE TRAY) ×3 IMPLANT
PENCIL BUTTON HOLSTER BLD 10FT (ELECTRODE) ×3 IMPLANT
PIN SAFETY STERILE (MISCELLANEOUS) IMPLANT
PUNCH BIOPSY DERMAL 4MM (MISCELLANEOUS) IMPLANT
SHEET MEDIUM DRAPE 40X70 STRL (DRAPES) ×3 IMPLANT
SIZER BREAST REUSE 470CC (SIZER) ×3
SIZER BRST REUSE 470CC (SIZER) ×1 IMPLANT
SLEEVE SCD COMPRESS KNEE MED (MISCELLANEOUS) ×3 IMPLANT
SPONGE LAP 18X18 RF (DISPOSABLE) ×6 IMPLANT
STAPLER VISISTAT 35W (STAPLE) ×3 IMPLANT
SUT ETHILON 2 0 FS 18 (SUTURE) IMPLANT
SUT MNCRL AB 4-0 PS2 18 (SUTURE) ×6 IMPLANT
SUT PDS AB 2-0 CT2 27 (SUTURE) ×3 IMPLANT
SUT VIC AB 3-0 PS1 18 (SUTURE)
SUT VIC AB 3-0 PS1 18XBRD (SUTURE) IMPLANT
SUT VIC AB 3-0 SH 27 (SUTURE) ×4
SUT VIC AB 3-0 SH 27X BRD (SUTURE) ×2 IMPLANT
SUT VICRYL 4-0 PS2 18IN ABS (SUTURE) ×6 IMPLANT
SYR 20CC LL (SYRINGE) ×3 IMPLANT
SYR BULB IRRIGATION 50ML (SYRINGE) ×6 IMPLANT
SYR CONTROL 10ML LL (SYRINGE) IMPLANT
TOWEL GREEN STERILE FF (TOWEL DISPOSABLE) ×6 IMPLANT
TUBE CONNECTING 20'X1/4 (TUBING) ×2
TUBE CONNECTING 20X1/4 (TUBING) ×4 IMPLANT
UNDERPAD 30X30 (UNDERPADS AND DIAPERS) ×6 IMPLANT
YANKAUER SUCT BULB TIP NO VENT (SUCTIONS) ×3 IMPLANT

## 2018-03-30 NOTE — Anesthesia Postprocedure Evaluation (Signed)
Anesthesia Post Note  Patient: Sophia [name]  Procedure(s) Performed: REMOVAL OF BILATERAL TISSUE EXPANDERS WITH PLACEMENT OF BILATERAL BREAST IMPLANTS (Bilateral Breast)     Patient location during evaluation: PACU Anesthesia Type: General Level of consciousness: awake and alert Pain management: pain level controlled Vital Signs Assessment: post-procedure vital signs reviewed and stable Respiratory status: spontaneous breathing, nonlabored ventilation and respiratory function stable Cardiovascular status: blood pressure returned to baseline and stable Postop Assessment: no apparent nausea or vomiting Anesthetic complications: no    Last Vitals:  Vitals:   03/30/18 0908 03/30/18 1145  BP: 116/73 118/77  Pulse: 91 97  Resp: 18 16  Temp: 36.7 C 36.6 C  SpO2: 99% 100%    Last Pain:  Vitals:   03/30/18 1145  TempSrc:   PainSc: Olin E Kao Conry

## 2018-03-30 NOTE — Interval H&P Note (Signed)
History and Physical Interval Note:  03/30/2018 9:35 AM  Sophia [name]  has presented today for surgery, with the diagnosis of history breast cancer, acquired absence breasts, BRCA1  The various methods of treatment have been discussed with the patient and family. After consideration of risks, benefits and other options for treatment, the patient has consented to  Procedure(s): REMOVAL OF BILATERAL TISSUE EXPANDERS WITH PLACEMENT OF BILATERAL BREAST IMPLANTS (Bilateral) as a surgical intervention .  The patient's history has been reviewed, patient examined, no change in status, stable for surgery.  I have reviewed the patient's chart and labs.  Questions were answered to the patient's satisfaction.     Arnoldo Hooker Rion Schnitzer

## 2018-03-30 NOTE — Transfer of Care (Signed)
Immediate Anesthesia Transfer of Care Note  Patient: Sophia [name]  Procedure(s) Performed: REMOVAL OF BILATERAL TISSUE EXPANDERS WITH PLACEMENT OF BILATERAL BREAST IMPLANTS (Bilateral Breast)  Patient Location: PACU  Anesthesia Type:General  Level of Consciousness: awake, alert  and oriented  Airway & Oxygen Therapy: Patient Spontanous Breathing and Patient connected to face mask oxygen  Post-op Assessment: Report given to RN and Post -op Vital signs reviewed and stable  Post vital signs: Reviewed and stable  Last Vitals:  Vitals Value Taken Time  BP 118/77 03/30/2018 11:45 AM  Temp    Pulse 89 03/30/2018 11:47 AM  Resp 14 03/30/2018 11:47 AM  SpO2 100 % 03/30/2018 11:47 AM  Vitals shown include unvalidated device data.  Last Pain:  Vitals:   03/30/18 0908  TempSrc: Oral  PainSc: 0-No pain      Patients Stated Pain Goal: 3 (19/16/60 6004)  Complications: No apparent anesthesia complications

## 2018-03-30 NOTE — Discharge Instructions (Signed)

## 2018-03-30 NOTE — Anesthesia Preprocedure Evaluation (Addendum)
Anesthesia Evaluation  Patient identified by MRN, date of birth, ID band Patient awake    Reviewed: Allergy & Precautions, NPO status , Patient's Chart, lab work & pertinent test results  Airway Mallampati: II  TM Distance: >3 FB Neck ROM: Full    Dental no notable dental hx. (+) Teeth Intact, Dental Advisory Given   Pulmonary neg pulmonary ROS,    Pulmonary exam normal breath sounds clear to auscultation       Cardiovascular negative cardio ROS Normal cardiovascular exam Rhythm:Regular Rate:Normal     Neuro/Psych Depression negative neurological ROS     GI/Hepatic negative GI ROS, Neg liver ROS,   Endo/Other  negative endocrine ROS  Renal/GU negative Renal ROS  negative genitourinary   Musculoskeletal negative musculoskeletal ROS (+)   Abdominal   Peds negative pediatric ROS (+)  Hematology negative hematology ROS (+)   Anesthesia Other Findings   Reproductive/Obstetrics Breast ca                             Anesthesia Physical Anesthesia Plan  ASA: II  Anesthesia Plan: General   Post-op Pain Management:    Induction: Intravenous  PONV Risk Score and Plan: 3 and Treatment may vary due to age or medical condition, Ondansetron, Dexamethasone and Scopolamine patch - Pre-op  Airway Management Planned: LMA  Additional Equipment:   Intra-op Plan:   Post-operative Plan: Extubation in OR  Informed Consent: I have reviewed the patients History and Physical, chart, labs and discussed the procedure including the risks, benefits and alternatives for the proposed anesthesia with the patient or authorized representative who has indicated his/her understanding and acceptance.   Dental advisory given  Plan Discussed with: CRNA  Anesthesia Plan Comments:        Anesthesia Quick Evaluation

## 2018-03-30 NOTE — Anesthesia Procedure Notes (Signed)
Procedure Name: LMA Insertion Date/Time: 03/30/2018 10:08 AM Performed by: Genelle Bal, CRNA Pre-anesthesia Checklist: Patient identified, Emergency Drugs available, Suction available and Patient being monitored Patient Re-evaluated:Patient Re-evaluated prior to induction Oxygen Delivery Method: Circle system utilized Preoxygenation: Pre-oxygenation with 100% oxygen Induction Type: IV induction Ventilation: Mask ventilation without difficulty LMA: LMA inserted LMA Size: 4.0 Number of attempts: 1 Airway Equipment and Method: Bite block Placement Confirmation: positive ETCO2 Tube secured with: Tape Dental Injury: Teeth and Oropharynx as per pre-operative assessment

## 2018-03-30 NOTE — Op Note (Signed)
Operative Note   DATE OF OPERATION: 11.5.19  LOCATION: [facility]-outpatient  SURGICAL DIVISION: Plastic Surgery  PREOPERATIVE DIAGNOSES:  1. History breast cancer 2. Acquired absence breasts 3. BRCA1  POSTOPERATIVE DIAGNOSES:  same  PROCEDURE:  Removal bilateral tissue expanders and placement silicone implants  SURGEON: [name] MD MBA  ASSISTANT: none  ANESTHESIA:  General.   EBL: 50 ml  COMPLICATIONS: None immediate.   INDICATIONS FOR PROCEDURE:  The patient, Sophia [name], is a 27 y.o. female born on 05-18-1991, is here for second stage breast reconstruction following bilateral mastectomies with immediate prepectoral expander acellular dermis reconstruction.   FINDINGS: Full incorporation ADM bilateral. Natrelle Inspira Smooth Round Extra Projection 470 ml implants placed bilateral. REF SRX-[id number] RIGHT SN [id number] LEFT SN [id number]  DESCRIPTION OF PROCEDURE:  The patient's operative site was marked with the patient in the preoperative area to mark sternal notch, chest midline, anterior axillary lines.The patientwas taken to the operating room. SCDs were placed and IV antibiotics were given. The patient's operative site was prepped and draped in a sterile fashion. A time out was performed and all information was confirmed to be correct.I began on left side. Incision made through transverse chest mastectomyscar and carried through superficial fascia to acellular demis.ADM incised.Expander removed. Capsulotomies performedsuperiorly.Sizer placed.  I then directed attention to right chest andimplant cavityentered in similar manner. Expander removed and well incorporated ADM noted. Capsulotomies performed superiorly. Sizer placed. Natrelle Smooth RoundExtra Projection 470 mlimplants selected for bilateral placement.  There was some asymmetry IMF noted with right lower. Thermal capsulorraphy performed over inferior capsule. 2-0 PDS suture then placed from  anterior mastectomy flap to chest wall at desired IMF.  Each cavity irrigated with bacitracin, Ancef, gentamicinsolution.Hemostasis ensured.Each cavity then irrigated with Betadine. The implant was placed inright chest andimplant orientation ensured. Closure completed with 3-0 vicryl to closesuperficial fascia and ADMover implant.4-0 vicryl used to close dermis followed by 4-0 monocryl subcuticular. Implant placed inleftchest cavity.Closure completedin similar fashion.Tissue adhesive and dry dressing applied, followed by breastbinderand abdominal compression garment.  The patient was allowed to wake from anesthesia, extubated and taken to the recovery room in satisfactory condition.   SPECIMENS: none  DRAINS: none  [name], MD [facility] Plastic & Reconstructive Surgery [phone], pin [phone]

## 2018-03-31 ENCOUNTER — Encounter (HOSPITAL_BASED_OUTPATIENT_CLINIC_OR_DEPARTMENT_OTHER): Payer: Self-pay | Admitting: Plastic Surgery

## 2018-04-02 ENCOUNTER — Other Ambulatory Visit: Payer: Self-pay | Admitting: Pharmacist

## 2018-04-08 ENCOUNTER — Other Ambulatory Visit: Payer: Self-pay

## 2018-04-08 ENCOUNTER — Telehealth: Payer: Self-pay | Admitting: Hematology & Oncology

## 2018-04-08 ENCOUNTER — Encounter

## 2018-04-08 ENCOUNTER — Ambulatory Visit (INDEPENDENT_AMBULATORY_CARE_PROVIDER_SITE_OTHER): Payer: Medicaid Other

## 2018-04-08 DIAGNOSIS — Z5111 Encounter for antineoplastic chemotherapy: Secondary | ICD-10-CM

## 2018-04-08 DIAGNOSIS — C50111 Malignant neoplasm of central portion of right female breast: Secondary | ICD-10-CM

## 2018-04-08 DIAGNOSIS — Z171 Estrogen receptor negative status [ER-]: Secondary | ICD-10-CM

## 2018-04-08 NOTE — Telephone Encounter (Signed)
Received call from pt to r/s 11/15 appt to 11/21 at 0830

## 2018-04-09 ENCOUNTER — Inpatient Hospital Stay: Payer: Medicaid Other | Admitting: Family

## 2018-04-09 ENCOUNTER — Inpatient Hospital Stay: Payer: Medicaid Other

## 2018-04-15 ENCOUNTER — Inpatient Hospital Stay: Payer: Medicaid Other

## 2018-04-15 ENCOUNTER — Inpatient Hospital Stay: Payer: Medicaid Other | Attending: Hematology & Oncology

## 2018-04-15 ENCOUNTER — Other Ambulatory Visit: Payer: Self-pay

## 2018-04-15 ENCOUNTER — Inpatient Hospital Stay (HOSPITAL_BASED_OUTPATIENT_CLINIC_OR_DEPARTMENT_OTHER): Payer: Medicaid Other | Admitting: Family

## 2018-04-15 ENCOUNTER — Encounter: Payer: Self-pay | Admitting: Family

## 2018-04-15 VITALS — BP 115/73 | HR 86 | Temp 98.2°F | Resp 18 | Wt 194.0 lb

## 2018-04-15 DIAGNOSIS — Z171 Estrogen receptor negative status [ER-]: Principal | ICD-10-CM

## 2018-04-15 DIAGNOSIS — C50911 Malignant neoplasm of unspecified site of right female breast: Secondary | ICD-10-CM | POA: Insufficient documentation

## 2018-04-15 DIAGNOSIS — C50919 Malignant neoplasm of unspecified site of unspecified female breast: Secondary | ICD-10-CM

## 2018-04-15 DIAGNOSIS — D500 Iron deficiency anemia secondary to blood loss (chronic): Secondary | ICD-10-CM

## 2018-04-15 DIAGNOSIS — C50011 Malignant neoplasm of nipple and areola, right female breast: Secondary | ICD-10-CM

## 2018-04-15 DIAGNOSIS — C50111 Malignant neoplasm of central portion of right female breast: Secondary | ICD-10-CM

## 2018-04-15 DIAGNOSIS — Z5112 Encounter for antineoplastic immunotherapy: Secondary | ICD-10-CM | POA: Diagnosis present

## 2018-04-15 LAB — CMP (CANCER CENTER ONLY)
ALBUMIN: 3.6 g/dL (ref 3.5–5.0)
ALK PHOS: 76 U/L (ref 26–84)
ALT: 29 U/L (ref 10–47)
AST: 31 U/L (ref 11–38)
Anion gap: 4 — ABNORMAL LOW (ref 5–15)
BUN: 14 mg/dL (ref 7–22)
CHLORIDE: 105 mmol/L (ref 98–108)
CO2: 28 mmol/L (ref 18–33)
CREATININE: 0.7 mg/dL (ref 0.60–1.20)
Calcium: 9.9 mg/dL (ref 8.0–10.3)
Glucose, Bld: 97 mg/dL (ref 73–118)
Potassium: 4.2 mmol/L (ref 3.3–4.7)
Sodium: 137 mmol/L (ref 128–145)
TOTAL PROTEIN: 8.3 g/dL — AB (ref 6.4–8.1)
Total Bilirubin: 0.7 mg/dL (ref 0.2–1.6)

## 2018-04-15 LAB — CBC WITH DIFFERENTIAL (CANCER CENTER ONLY)
ABS IMMATURE GRANULOCYTES: 0.01 10*3/uL (ref 0.00–0.07)
BASOS ABS: 0 10*3/uL (ref 0.0–0.1)
Basophils Relative: 1 %
EOS PCT: 6 %
Eosinophils Absolute: 0.3 10*3/uL (ref 0.0–0.5)
HEMATOCRIT: 36.8 % (ref 36.0–46.0)
HEMOGLOBIN: 11.9 g/dL — AB (ref 12.0–15.0)
Immature Granulocytes: 0 %
LYMPHS ABS: 2.1 10*3/uL (ref 0.7–4.0)
LYMPHS PCT: 35 %
MCH: 28.2 pg (ref 26.0–34.0)
MCHC: 32.3 g/dL (ref 30.0–36.0)
MCV: 87.2 fL (ref 80.0–100.0)
MONO ABS: 0.5 10*3/uL (ref 0.1–1.0)
Monocytes Relative: 8 %
NRBC: 0 % (ref 0.0–0.2)
Neutro Abs: 3.1 10*3/uL (ref 1.7–7.7)
Neutrophils Relative %: 50 %
Platelet Count: 174 10*3/uL (ref 150–400)
RBC: 4.22 MIL/uL (ref 3.87–5.11)
RDW: 13.1 % (ref 11.5–15.5)
WBC Count: 6.1 10*3/uL (ref 4.0–10.5)

## 2018-04-15 LAB — IRON AND TIBC
Iron: 57 ug/dL (ref 41–142)
Saturation Ratios: 19 % — ABNORMAL LOW (ref 21–57)
TIBC: 291 ug/dL (ref 236–444)
UIBC: 235 ug/dL (ref 120–384)

## 2018-04-15 LAB — LACTATE DEHYDROGENASE: LDH: 228 U/L — AB (ref 98–192)

## 2018-04-15 LAB — FERRITIN: FERRITIN: 62 ng/mL (ref 11–307)

## 2018-04-15 MED ORDER — SODIUM CHLORIDE 0.9 % IV SOLN
3.6000 mg/kg | Freq: Once | INTRAVENOUS | Status: AC
  Administered 2018-04-15: 300 mg via INTRAVENOUS
  Filled 2018-04-15: qty 15

## 2018-04-15 MED ORDER — ACETAMINOPHEN 325 MG PO TABS: 650.0000 mg | ORAL_TABLET | Freq: Once | ORAL | Status: DC

## 2018-04-15 MED ORDER — DIPHENHYDRAMINE HCL 25 MG PO CAPS: 25.0000 mg | ORAL_CAPSULE | Freq: Once | ORAL | Status: DC

## 2018-04-15 MED ORDER — SODIUM CHLORIDE 0.9% FLUSH
10.0000 mL | INTRAVENOUS | Status: DC | PRN
  Administered 2018-04-15: 10 mL
  Filled 2018-04-15: qty 10

## 2018-04-15 MED ORDER — HEPARIN SOD (PORK) LOCK FLUSH 100 UNIT/ML IV SOLN
500.0000 [IU] | Freq: Once | INTRAVENOUS | Status: AC | PRN
  Administered 2018-04-15: 500 [IU]
  Filled 2018-04-15: qty 5

## 2018-04-15 MED ORDER — SODIUM CHLORIDE 0.9 % IV SOLN
Freq: Once | INTRAVENOUS | Status: AC
  Administered 2018-04-15: 10:00:00 via INTRAVENOUS
  Filled 2018-04-15: qty 250

## 2018-04-15 NOTE — Progress Notes (Signed)
Hematology and Oncology Follow Up Visit  Sophia [name] [id number] [birth year] 27 y.o. 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 Sophia [name] is here today for follow-up and treatment. She did quite well with the removal of her breast expanders and placement of implants. Her incisions are intact and healing nicely. No redness, edema or drainage. She has some fluid puffiness on the right side that is slowly improving.  She has her same level of mild fatigue.  Her hot flashes and night sweats have improved with taking the [employer].  No fever, chills, n/v, cough, rash, dizziness, SOB, chest pain, palpitations, abdominal pain or changes in bowel or bladder habits.  No numbness or tingling in her extremities at this time. She had one episode of numbness and tingling in the pinkly and ring fingers of her left hand one evening but this resolved after a couple hours and she has had no other issues.  No lymphadenopathy noted on exam.  She has had some tenderness in her right back below the shoulder blade off and on.  No episodes of bleeding, no bruising or petechiae.  She has maintained a good appetite and is staying well hydrated. Her weight is stable.   ECOG Performance Status: 1 - Symptomatic but completely ambulatory  Medications:  Allergies as of 04/15/2018   No Known Allergies     Medication List        Accurate as of 04/15/18  9:06 AM. Always use your most recent med list.          ado-trastuzumab emtansine 100 MG Solr Commonly known as:  KADCYLA Inject 15 mLs (300 mg total) into the vein every 21 ( twenty-one) days.       Allergies: No Known Allergies  Past Medical History, Surgical history, Social history, and Family History were reviewed and updated.  Review of Systems: All other 10 point review of systems is negative.   Physical Exam:  weight is 194 lb (88 kg). Her oral temperature is 98.2 F (36.8 C). Her blood pressure is 115/73 and her pulse is 86. Her respiration is 18 and oxygen saturation is 100%.   Wt Readings from Last 3 Encounters:  04/15/18 194 lb (88 kg)  03/30/18 192 lb 14.4 oz (87.5 kg)  03/19/18 192 lb (87.1 kg)    Ocular: Sclerae unicteric, pupils equal, round and reactive to light Ear-nose-throat: Oropharynx clear, dentition fair Lymphatic: No cervical, supraclavicular or axillary adenopathy Lungs no rales or rhonchi, good excursion bilaterally Heart regular rate and rhythm, no murmur appreciated Abd soft, nontender, positive bowel sounds, no liver or spleen tip palpated on exam, no fluid wave  MSK no focal spinal tenderness, no joint edema Neuro: non-focal, well-oriented, appropriate affect Breasts: Bilateral breast implant incisions are healing nicely, no mass, lesion or rash, noted  Lab Results  Component Value Date   WBC 6.1 04/15/2018   HGB 11.9 (L) 04/15/2018   HCT 36.8 04/15/2018   MCV 87.2 04/15/2018   PLT 174 04/15/2018   Lab Results  Component Value Date   FERRITIN 67 02/26/2018  IRON 46 02/26/2018   TIBC 295 02/26/2018   UIBC 249 02/26/2018   IRONPCTSAT 15 (L) 02/26/2018   Lab Results  Component Value Date   RBC 4.22 04/15/2018   No results found for: KPAFRELGTCHN, LAMBDASER, KAPLAMBRATIO No results found for: IGGSERUM, IGA, IGMSERUM No results found for: Odetta Pink, SPEI   Chemistry      Component Value Date/Time   NA 139 03/19/2018 0926   K  3.7 03/19/2018 0926   CL 106 03/19/2018 0926   CO2 27 03/19/2018 0926   BUN 15 03/19/2018 0926   CREATININE 1.10 03/19/2018 0926      Component Value Date/Time   CALCIUM 9.9 03/19/2018 0926   ALKPHOS 88 (H) 03/19/2018 0926   AST 35 03/19/2018 0926   ALT 36 03/19/2018 0926   BILITOT 0.6 03/19/2018 0926       Impression and Plan: Ms. Sophia [name] is a very pleasant 27 yo caucasian female with locally advanced stage III ductal carcinoma of the right breast, HER-2 and ER negative. She is BCRA positive.  Her reconstruction surgery with expander removal and implant placement went well. She is healing nicely.  ECHO last week showed an EF 60-65%.  We will proceed with treatment today as planned per Dr. Marin Olp.  We will plan to see her back in another 3 weeks. For MD follow-up and treatment.  She will contact our office with any questions or concerns. We can certainly see her sooner if need be.   Laverna Peace, NP 11/21/20199:06 AM

## 2018-04-15 NOTE — Patient Instructions (Signed)
Implanted Port Insertion, Care After °This sheet gives you information about how to care for yourself after your procedure. Your health care provider may also give you more specific instructions. If you have problems or questions, contact your health care provider. °What can I expect after the procedure? °After your procedure, it is common to have: °· Discomfort at the port insertion site. °· Bruising on the skin over the port. This should improve over 3-4 days. ° °Follow these instructions at home: °Port care °· After your port is placed, you will get a manufacturer's information card. The card has information about your port. Keep this card with you at all times. °· Take care of the port as told by your health care provider. Ask your health care provider if you or a family member can get training for taking care of the port at home. A home health care nurse may also take care of the port. °· Make sure to remember what type of port you have. °Incision care °· Follow instructions from your health care provider about how to take care of your port insertion site. Make sure you: °? Wash your hands with soap and water before you change your bandage (dressing). If soap and water are not available, use hand sanitizer. °? Change your dressing as told by your health care provider. °? Leave stitches (sutures), skin glue, or adhesive strips in place. These skin closures may need to stay in place for 2 weeks or longer. If adhesive strip edges start to loosen and curl up, you may trim the loose edges. Do not remove adhesive strips completely unless your health care provider tells you to do that. °· Check your port insertion site every day for signs of infection. Check for: °? More redness, swelling, or pain. °? More fluid or blood. °? Warmth. °? Pus or a bad smell. °General instructions °· Do not take baths, swim, or use a hot tub until your health care provider approves. °· Do not lift anything that is heavier than 10 lb (4.5  kg) for a week, or as told by your health care provider. °· Ask your health care provider when it is okay to: °? Return to work or school. °? Resume usual physical activities or sports. °· Do not drive for 24 hours if you were given a medicine to help you relax (sedative). °· Take over-the-counter and prescription medicines only as told by your health care provider. °· Wear a medical alert bracelet in case of an emergency. This will tell any health care providers that you have a port. °· Keep all follow-up visits as told by your health care provider. This is important. °Contact a health care provider if: °· You cannot flush your port with saline as directed, or you cannot draw blood from the port. °· You have a fever or chills. °· You have more redness, swelling, or pain around your port insertion site. °· You have more fluid or blood coming from your port insertion site. °· Your port insertion site feels warm to the touch. °· You have pus or a bad smell coming from the port insertion site. °Get help right away if: °· You have chest pain or shortness of breath. °· You have bleeding from your port that you cannot control. °Summary °· Take care of the port as told by your health care provider. °· Change your dressing as told by your health care provider. °· Keep all follow-up visits as told by your health care provider. °  This information is not intended to replace advice given to you by your health care provider. Make sure you discuss any questions you have with your health care provider. °Document Released: 03/02/2013 Document Revised: 04/02/2016 Document Reviewed: 04/02/2016 °Elsevier Interactive Patient Education © 2017 [employer] ° °

## 2018-04-15 NOTE — Patient Instructions (Signed)
[facility] Discharge Instructions for Patients Receiving Chemotherapy  Today you received the following chemotherapy agents Kadcyla  To help prevent nausea and vomiting after your treatment, we encourage you to take your nausea medication.   If you develop nausea and vomiting that is not controlled by your nausea medication, call the clinic.   BELOW ARE SYMPTOMS THAT SHOULD BE REPORTED IMMEDIATELY:  *FEVER GREATER THAN 100.5 F  *CHILLS WITH OR WITHOUT FEVER  NAUSEA AND VOMITING THAT IS NOT CONTROLLED WITH YOUR NAUSEA MEDICATION  *UNUSUAL SHORTNESS OF BREATH  *UNUSUAL BRUISING OR BLEEDING  TENDERNESS IN MOUTH AND THROAT WITH OR WITHOUT PRESENCE OF ULCERS  *URINARY PROBLEMS  *BOWEL PROBLEMS  UNUSUAL RASH Items with * indicate a potential emergency and should be followed up as soon as possible.  Feel free to call the clinic should you have any questions or concerns. The clinic phone number is [phone].  Please show the CHEMO ALERT CARD at check-in to the Emergency Department and triage nurse.    

## 2018-04-18 LAB — ESTRADIOL, ULTRA SENS: ESTRADIOL, SENSITIVE: 5.6 pg/mL

## 2018-05-06 ENCOUNTER — Inpatient Hospital Stay: Payer: Medicaid Other

## 2018-05-06 ENCOUNTER — Telehealth: Payer: Self-pay | Admitting: Hematology & Oncology

## 2018-05-06 ENCOUNTER — Encounter: Payer: Self-pay | Admitting: Hematology & Oncology

## 2018-05-06 ENCOUNTER — Inpatient Hospital Stay: Payer: Medicaid Other | Attending: Hematology & Oncology | Admitting: Hematology & Oncology

## 2018-05-06 ENCOUNTER — Other Ambulatory Visit: Payer: Self-pay

## 2018-05-06 VITALS — BP 122/70 | HR 98 | Temp 98.2°F | Resp 18 | Wt 197.0 lb

## 2018-05-06 DIAGNOSIS — Z9013 Acquired absence of bilateral breasts and nipples: Secondary | ICD-10-CM | POA: Insufficient documentation

## 2018-05-06 DIAGNOSIS — Z95828 Presence of other vascular implants and grafts: Secondary | ICD-10-CM

## 2018-05-06 DIAGNOSIS — Z171 Estrogen receptor negative status [ER-]: Secondary | ICD-10-CM | POA: Diagnosis not present

## 2018-05-06 DIAGNOSIS — C50111 Malignant neoplasm of central portion of right female breast: Secondary | ICD-10-CM

## 2018-05-06 DIAGNOSIS — M545 Low back pain: Secondary | ICD-10-CM | POA: Insufficient documentation

## 2018-05-06 DIAGNOSIS — C50011 Malignant neoplasm of nipple and areola, right female breast: Secondary | ICD-10-CM

## 2018-05-06 DIAGNOSIS — Z5112 Encounter for antineoplastic immunotherapy: Secondary | ICD-10-CM | POA: Insufficient documentation

## 2018-05-06 DIAGNOSIS — C50911 Malignant neoplasm of unspecified site of right female breast: Secondary | ICD-10-CM | POA: Diagnosis not present

## 2018-05-06 DIAGNOSIS — C7951 Secondary malignant neoplasm of bone: Secondary | ICD-10-CM | POA: Diagnosis not present

## 2018-05-06 DIAGNOSIS — C50919 Malignant neoplasm of unspecified site of unspecified female breast: Secondary | ICD-10-CM

## 2018-05-06 DIAGNOSIS — Z1501 Genetic susceptibility to malignant neoplasm of breast: Secondary | ICD-10-CM | POA: Diagnosis not present

## 2018-05-06 DIAGNOSIS — D500 Iron deficiency anemia secondary to blood loss (chronic): Secondary | ICD-10-CM | POA: Insufficient documentation

## 2018-05-06 DIAGNOSIS — R319 Hematuria, unspecified: Secondary | ICD-10-CM | POA: Diagnosis not present

## 2018-05-06 LAB — CBC WITH DIFFERENTIAL (CANCER CENTER ONLY)
Abs Immature Granulocytes: 0.02 10*3/uL (ref 0.00–0.07)
BASOS ABS: 0 10*3/uL (ref 0.0–0.1)
BASOS PCT: 0 %
EOS ABS: 0.4 10*3/uL (ref 0.0–0.5)
Eosinophils Relative: 6 %
HEMATOCRIT: 37 % (ref 36.0–46.0)
HEMOGLOBIN: 11.7 g/dL — AB (ref 12.0–15.0)
Immature Granulocytes: 0 %
LYMPHS PCT: 31 %
Lymphs Abs: 2.2 10*3/uL (ref 0.7–4.0)
MCH: 27.7 pg (ref 26.0–34.0)
MCHC: 31.6 g/dL (ref 30.0–36.0)
MCV: 87.7 fL (ref 80.0–100.0)
MONO ABS: 0.6 10*3/uL (ref 0.1–1.0)
Monocytes Relative: 9 %
NEUTROS PCT: 54 %
NRBC: 0 % (ref 0.0–0.2)
Neutro Abs: 3.8 10*3/uL (ref 1.7–7.7)
Platelet Count: 161 10*3/uL (ref 150–400)
RBC: 4.22 MIL/uL (ref 3.87–5.11)
RDW: 13.2 % (ref 11.5–15.5)
WBC Count: 7 10*3/uL (ref 4.0–10.5)

## 2018-05-06 LAB — CMP (CANCER CENTER ONLY)
ALK PHOS: 72 U/L (ref 38–126)
ALT: 21 U/L (ref 0–44)
ANION GAP: 8 (ref 5–15)
AST: 28 U/L (ref 15–41)
Albumin: 4.1 g/dL (ref 3.5–5.0)
BILIRUBIN TOTAL: 0.4 mg/dL (ref 0.3–1.2)
BUN: 14 mg/dL (ref 6–20)
CO2: 28 mmol/L (ref 22–32)
Calcium: 9.9 mg/dL (ref 8.9–10.3)
Chloride: 99 mmol/L (ref 98–111)
Creatinine: 0.78 mg/dL (ref 0.44–1.00)
GFR, Est AFR Am: >60 mL/min (ref >60)
GLUCOSE: 108 mg/dL — AB (ref 70–99)
Potassium: 3.7 mmol/L (ref 3.5–5.1)
Sodium: 135 mmol/L (ref 135–145)
TOTAL PROTEIN: 7.8 g/dL (ref 6.5–8.1)

## 2018-05-06 LAB — IRON AND TIBC
IRON: 39 ug/dL — AB (ref 41–142)
SATURATION RATIOS: 13 % — AB (ref 21–57)
TIBC: 294 ug/dL (ref 236–444)
UIBC: 255 ug/dL (ref 120–384)

## 2018-05-06 LAB — LACTATE DEHYDROGENASE: LDH: 346 U/L — ABNORMAL HIGH (ref 98–192)

## 2018-05-06 LAB — FERRITIN: FERRITIN: 92 ng/mL (ref 11–307)

## 2018-05-06 MED ORDER — HEPARIN SOD (PORK) LOCK FLUSH 100 UNIT/ML IV SOLN
500.0000 [IU] | Freq: Once | INTRAVENOUS | Status: AC | PRN
  Administered 2018-05-06: 500 [IU]
  Filled 2018-05-06: qty 5

## 2018-05-06 MED ORDER — SODIUM CHLORIDE 0.9 % IV SOLN
3.6000 mg/kg | Freq: Once | INTRAVENOUS | Status: AC
  Administered 2018-05-06: 300 mg via INTRAVENOUS
  Filled 2018-05-06: qty 15

## 2018-05-06 MED ORDER — SODIUM CHLORIDE 0.9% FLUSH
10.0000 mL | INTRAVENOUS | Status: DC | PRN
  Administered 2018-05-06: 10 mL via INTRAVENOUS
  Filled 2018-05-06: qty 10

## 2018-05-06 MED ORDER — SODIUM CHLORIDE 0.9 % IV SOLN
Freq: Once | INTRAVENOUS | Status: AC
  Administered 2018-05-06: 10:00:00 via INTRAVENOUS
  Filled 2018-05-06: qty 250

## 2018-05-06 MED ORDER — KETOROLAC TROMETHAMINE 15 MG/ML IJ SOLN
30.0000 mg | Freq: Once | INTRAMUSCULAR | Status: AC
  Administered 2018-05-06: 30 mg via INTRAVENOUS

## 2018-05-06 MED ORDER — ACETAMINOPHEN 325 MG PO TABS: 650.0000 mg | ORAL_TABLET | Freq: Once | ORAL | Status: DC

## 2018-05-06 MED ORDER — KETOROLAC TROMETHAMINE 30 MG/ML IJ SOLN: 30.0000 mg | Freq: Once | INTRAMUSCULAR | Status: DC

## 2018-05-06 MED ORDER — SODIUM CHLORIDE 0.9% FLUSH
10.0000 mL | INTRAVENOUS | Status: DC | PRN
  Administered 2018-05-06: 10 mL
  Filled 2018-05-06: qty 10

## 2018-05-06 MED ORDER — SODIUM CHLORIDE 0.9 % IV SOLN
510.0000 mg | Freq: Once | INTRAVENOUS | Status: AC
  Administered 2018-05-06: 510 mg via INTRAVENOUS
  Filled 2018-05-06: qty 17

## 2018-05-06 MED ORDER — DIPHENHYDRAMINE HCL 25 MG PO CAPS: 25.0000 mg | ORAL_CAPSULE | Freq: Once | ORAL | Status: DC

## 2018-05-06 NOTE — Telephone Encounter (Signed)
Appointments scheduled avs/calendar declined due to my chart per 12/12 los

## 2018-05-06 NOTE — Patient Instructions (Signed)
[facility] Discharge Instructions for Patients Receiving Chemotherapy  Today you received the following chemotherapy agents:  Kadcyla  To help prevent nausea and vomiting after your treatment, we encourage you to take your nausea medication as ordered per MD.    If you develop nausea and vomiting that is not controlled by your nausea medication, call the clinic.   BELOW ARE SYMPTOMS THAT SHOULD BE REPORTED IMMEDIATELY:  *FEVER GREATER THAN 100.5 F  *CHILLS WITH OR WITHOUT FEVER  NAUSEA AND VOMITING THAT IS NOT CONTROLLED WITH YOUR NAUSEA MEDICATION  *UNUSUAL SHORTNESS OF BREATH  *UNUSUAL BRUISING OR BLEEDING  TENDERNESS IN MOUTH AND THROAT WITH OR WITHOUT PRESENCE OF ULCERS  *URINARY PROBLEMS  *BOWEL PROBLEMS  UNUSUAL RASH Items with * indicate a potential emergency and should be followed up as soon as possible.  Feel free to call the clinic should you have any questions or concerns. The clinic phone number is [phone].  Please show the CHEMO ALERT CARD at check-in to the Emergency Department and triage nurse.   

## 2018-05-06 NOTE — Progress Notes (Signed)
Hematology and Oncology Follow Up Visit  Sophia [name] [id number] [birth year] 27 y.o. 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  She had a very nice Thanksgiving.  She was with her family.  She is looking forward to Christmas.  She has 2 young sons that she is making sure are well gifted.  She is now complaining of some discomfort in the back.  This is in the lower back on the right side.  She has pain that radiates around the right side.  This is been going on for a couple weeks.  She denies any type of trauma.  There is no fever.  There is no rashes.  She has had no change in bowel or bladder habits.  I will have to go ahead and get her set up with a MRI.  The MRI is going to be the best way for [place] to find out what is going on.  She is at risk for metastatic disease.  She is doing well with her reconstruction.  She goes back to see the reconstruction surgeon today.  She has had no bleeding.  There is no headache.  She has had no cough or shortness of breath.  Overall, her performance status is ECOG 1   Medications:  Allergies as of 05/06/2018   No Known Allergies     Medication List       Accurate as of May 06, 2018  9:08 AM. Always use your most recent med list.        ado-trastuzumab emtansine 100 MG Solr Commonly known as:  KADCYLA Inject 15 mLs (300 mg total) into the vein every 21 ( twenty-one) days.     HYDROcodone-acetaminophen 5-325 MG tablet Commonly known as:  NORCO/VICODIN Take 1 tablet by mouth every 6 (six) hours as needed for moderate pain.       Allergies: No Known Allergies  Past Medical History, Surgical history, Social history, and Family History were reviewed and updated.  Review of Systems: Review of Systems  Constitutional: Negative.   HENT: Negative.   Eyes: Negative.   Respiratory: Negative.   Cardiovascular: Negative.   Gastrointestinal: Negative.   Genitourinary: Negative.   Musculoskeletal: Positive for back pain.  Skin: Negative.   Neurological: Negative.   Endo/Heme/Allergies: Negative.   Psychiatric/Behavioral: Negative.      Physical Exam:  weight is 197 lb (89.4 kg). Her oral temperature is 98.2 F (36.8 C). Her blood pressure is 122/70 and her pulse is 98. Her respiration is 18 and oxygen saturation is 98%.   Wt Readings from Last 3 Encounters:  05/06/18 197 lb (89.4 kg)  04/15/18 194 lb (88 kg)  03/30/18 192 lb 14.4 oz (87.5 kg)    Physical Exam Vitals signs reviewed.  Constitutional:      Comments: Breast exam shows bilateral mastectomies with implants.  These are well-healed.  There is no erythema.  There is no warmth.  There is no palpable axillary lymph nodes.  HENT:     Head: Normocephalic and  atraumatic.  Eyes:     Pupils: Pupils are equal, round, and reactive to light.  Neck:     Musculoskeletal: Normal range of motion.  Cardiovascular:     Rate and Rhythm: Normal rate and regular rhythm.     Heart sounds: Normal heart sounds.  Pulmonary:     Effort: Pulmonary effort is normal.     Breath sounds: Normal breath sounds.  Abdominal:     General: Bowel sounds are normal.     Palpations: Abdomen is soft.  Musculoskeletal: Normal range of motion.        General: No tenderness or deformity.     Comments: Back exam shows no tenderness over the spine.  There may be some slight spasms in the right lumbar paraspinal muscles.  There  is some tenderness along the right flank.  Lymphadenopathy:     Cervical: No cervical adenopathy.  Skin:    General: Skin is warm and dry.     Findings: No erythema or rash.  Neurological:     Mental Status: She is alert and oriented to person, place, and time.  Psychiatric:        Behavior: Behavior normal.        Thought Content: Thought content normal.        Judgment: Judgment normal.      Lab Results  Component Value Date   WBC 6.1 04/15/2018   HGB 11.9 (L) 04/15/2018   HCT 36.8 04/15/2018   MCV 87.2 04/15/2018   PLT 174 04/15/2018   Lab Results  Component Value Date   FERRITIN 62 04/15/2018   IRON 57 04/15/2018   TIBC 291 04/15/2018   UIBC 235 04/15/2018   IRONPCTSAT 19 (L) 04/15/2018   Lab Results  Component Value Date   RBC 4.22 04/15/2018   No results found for: KPAFRELGTCHN, LAMBDASER, KAPLAMBRATIO No results found for: IGGSERUM, IGA, IGMSERUM No results found for: Sophia [name], SPEI   Chemistry      Component Value Date/Time   NA 137 04/15/2018 0833   K 4.2 04/15/2018 0833   CL 105 04/15/2018 0833   CO2 28 04/15/2018 0833   BUN 14 04/15/2018 0833   CREATININE 0.70 04/15/2018 0833      Component Value Date/Time   CALCIUM 9.9 04/15/2018 0833   ALKPHOS 76 04/15/2018 0833   AST 31 04/15/2018 0833   ALT 29 04/15/2018 0833   BILITOT 0.7 04/15/2018 0833       Impression and Plan: Sophia [name] is a very pleasant 27 yo caucasian female with locally advanced stage III ductal carcinoma of the right breast, HER-2 and ER negative. She is BCRA positive.   I am not sure what is going on with this back discomfort.  Again, we really are going to have to evaluate this.  I do believe that an MRI is can be the best way for [place] to do this.  She has some hydrocodone at home.  She has been taking this which she says helps.  While she is in the office today, I will give her a dose of Toradol to try to help with  the discomfort.  We will have her come back after the holidays for her next cycle of treatment.  We will get her through the Christmas and New Year's holiday.   Volanda Napoleon, MD 12/12/20199:08 AM

## 2018-05-09 ENCOUNTER — Other Ambulatory Visit: Payer: Self-pay | Admitting: Family

## 2018-05-09 DIAGNOSIS — Z171 Estrogen receptor negative status [ER-]: Principal | ICD-10-CM

## 2018-05-09 DIAGNOSIS — C50111 Malignant neoplasm of central portion of right female breast: Secondary | ICD-10-CM

## 2018-05-11 LAB — ESTRADIOL, ULTRA SENS: Estradiol, Sensitive: 4.3 pg/mL

## 2018-05-17 ENCOUNTER — Telehealth: Payer: Self-pay | Admitting: *Deleted

## 2018-05-17 ENCOUNTER — Inpatient Hospital Stay: Payer: Medicaid Other

## 2018-05-17 ENCOUNTER — Inpatient Hospital Stay (HOSPITAL_BASED_OUTPATIENT_CLINIC_OR_DEPARTMENT_OTHER): Payer: Medicaid Other | Admitting: Hematology & Oncology

## 2018-05-17 ENCOUNTER — Encounter: Payer: Self-pay | Admitting: Hematology & Oncology

## 2018-05-17 ENCOUNTER — Telehealth: Payer: Self-pay | Admitting: Hematology & Oncology

## 2018-05-17 ENCOUNTER — Other Ambulatory Visit: Payer: Self-pay

## 2018-05-17 ENCOUNTER — Ambulatory Visit (HOSPITAL_COMMUNITY)
Admission: RE | Admit: 2018-05-17 | Discharge: 2018-05-17 | Disposition: A | Discharge status: Home / Self Care | Payer: Medicaid Other | Source: Ambulatory Visit | Attending: Family | Admitting: Family

## 2018-05-17 VITALS — BP 123/74 | HR 110 | Temp 97.9°F | Resp 18 | Wt 195.0 lb

## 2018-05-17 DIAGNOSIS — M899 Disorder of bone, unspecified: Secondary | ICD-10-CM | POA: Diagnosis not present

## 2018-05-17 DIAGNOSIS — Z1509 Genetic susceptibility to other malignant neoplasm: Principal | ICD-10-CM

## 2018-05-17 DIAGNOSIS — Z1501 Genetic susceptibility to malignant neoplasm of breast: Secondary | ICD-10-CM

## 2018-05-17 DIAGNOSIS — R319 Hematuria, unspecified: Secondary | ICD-10-CM

## 2018-05-17 DIAGNOSIS — C50911 Malignant neoplasm of unspecified site of right female breast: Secondary | ICD-10-CM

## 2018-05-17 DIAGNOSIS — M545 Low back pain: Secondary | ICD-10-CM

## 2018-05-17 DIAGNOSIS — C50111 Malignant neoplasm of central portion of right female breast: Secondary | ICD-10-CM | POA: Diagnosis not present

## 2018-05-17 DIAGNOSIS — Z171 Estrogen receptor negative status [ER-]: Principal | ICD-10-CM

## 2018-05-17 DIAGNOSIS — M8448XA Pathological fracture, other site, initial encounter for fracture: Secondary | ICD-10-CM | POA: Insufficient documentation

## 2018-05-17 DIAGNOSIS — Z7951 Long term (current) use of inhaled steroids: Secondary | ICD-10-CM | POA: Diagnosis not present

## 2018-05-17 DIAGNOSIS — Z5112 Encounter for antineoplastic immunotherapy: Secondary | ICD-10-CM | POA: Diagnosis not present

## 2018-05-17 DIAGNOSIS — R29890 Loss of height: Secondary | ICD-10-CM | POA: Diagnosis not present

## 2018-05-17 DIAGNOSIS — Z9013 Acquired absence of bilateral breasts and nipples: Secondary | ICD-10-CM

## 2018-05-17 DIAGNOSIS — C50919 Malignant neoplasm of unspecified site of unspecified female breast: Secondary | ICD-10-CM

## 2018-05-17 DIAGNOSIS — C7951 Secondary malignant neoplasm of bone: Secondary | ICD-10-CM

## 2018-05-17 HISTORY — DX: Malignant neoplasm of unspecified site of unspecified female breast: C50.919

## 2018-05-17 LAB — URINALYSIS, COMPLETE (UACMP) WITH MICROSCOPIC
Bilirubin Urine: NEGATIVE
Glucose, UA: NEGATIVE mg/dL
KETONES UR: NEGATIVE mg/dL
Leukocytes, UA: NEGATIVE
Nitrite: NEGATIVE
Protein, ur: NEGATIVE mg/dL
Specific Gravity, Urine: 1.01 (ref 1.005–1.030)
pH: 6.5 (ref 5.0–8.0)

## 2018-05-17 MED ORDER — HYDROMORPHONE HCL 1 MG/ML IJ SOLN
INTRAMUSCULAR | Status: AC
  Filled 2018-05-17: qty 2

## 2018-05-17 MED ORDER — MELOXICAM 15 MG PO TABS: 15.0000 mg | ORAL_TABLET | Freq: Every day | ORAL | 3 refills | Status: DC

## 2018-05-17 MED ORDER — OXYCODONE HCL 10 MG PO TABS: 10.0000 mg | ORAL_TABLET | Freq: Four times a day (QID) | ORAL | 0 refills | Status: DC | PRN

## 2018-05-17 MED ORDER — HYDROMORPHONE HCL 1 MG/ML IJ SOLN
2.0000 mg | Freq: Once | INTRAMUSCULAR | Status: AC
  Administered 2018-05-17: 2 mg via SUBCUTANEOUS

## 2018-05-17 MED ORDER — GADOBUTROL 1 MMOL/ML IV SOLN
7.5000 mL | Freq: Once | INTRAVENOUS | Status: AC | PRN
  Administered 2018-05-17: 7.5 mL via INTRAVENOUS

## 2018-05-17 MED ORDER — DEXAMETHASONE 4 MG PO TABS
20.0000 mg | ORAL_TABLET | Freq: Once | ORAL | Status: AC
  Administered 2018-05-17: 20 mg via ORAL

## 2018-05-17 NOTE — Telephone Encounter (Signed)
LMVM for patient regarding appointment added for today per verbal from Dr Marin Olp  Patient returned call to confirm she would be here today and she would also have a driver with her as well.

## 2018-05-17 NOTE — Telephone Encounter (Signed)
Spoke with Opal Sidles at [facility] for a "call report" regarding MRI from today. Results given to Sarah [place],NP, and Dr. Marin Olp.

## 2018-05-17 NOTE — Addendum Note (Signed)
Addended by: Burney Gauze R on: 05/17/2018 02:42 PM   Modules accepted: Orders

## 2018-05-17 NOTE — Progress Notes (Signed)
Hematology and Oncology Follow Up Visit  Sophia [name] [id number] 09-08-1990 27 y.o. 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  We saw her couple weeks ago, she is complaining of lower back pain.  I was worried about the possibility of metastatic disease.  Unfortunately, we have now metastatic disease.  She had MRI done on 05/17/2018.  This showed that she had a pathologic fracture at T11.  There is also involvement of T12, L3 and L5.  This is absolutely devastating.  It is been a year that she had her first diagnosis of breast cancer.  She underwent neoadjuvant chemotherapy along with anti-HER-2 therapy.  She then underwent bilateral mastectomies in May.  She had 5/8+ lymph nodes.  She declined radiation therapy.  She then underwent adjuvant therapy with Kadcyla.  Now, we are dealing with a whole different situation.  She has treatable disease.  I wish I could say that this was curable disease.  She does have the BRCA gene.  As such, the use of PARP inhibitors is definitely necessary.  I have already spoken with Dr. Rip Harbour of [place].  She has graciously agreed to see Sophia [name] quickly.  I think that Ms. Eckroth will need vertebroplasty at T11.  She will then need radiation therapy to the lumbar spine.  We will have to get a PET scan set up on her.  This is an incredible disappointment to me  personally.  We had been incredibly aggressive with Sophia [name].  She really has done everything that she has needed to do to try to prevent this from recurring.  She is still having a lot of pain in the back.  It does not go to her legs.  There is no weakness in her legs.  There is no issues with bowels or bladder.  She has noted some blood in the urine.  She thought she is having some kidney stones.  We did do a urinalysis today on her.  Overall, her performance status is ECOG 1   Medications:  Allergies as of 05/17/2018   No Known Allergies     Medication List       Accurate as of May 17, 2018  1:51 PM. Always use your most recent med list.        ado-trastuzumab emtansine 100 MG Solr Commonly known as:  KADCYLA Inject 15 mLs (300 mg total) into the vein every 21 ( twenty-one) days.   meloxicam 15 MG tablet Commonly known as:  MOBIC Take 1 tablet (15 mg total) by mouth daily.   Oxycodone HCl 10 MG Tabs Take 1 tablet (10 mg total) by mouth every 6 (six) hours as needed for severe pain.       Allergies: No Known Allergies  Past Medical History, Surgical history, Social history, and Family History were reviewed and updated.  Review of Systems: Review of Systems  Constitutional: Negative.   HENT: Negative.   Eyes: Negative.   Respiratory: Negative.  Cardiovascular: Negative.   Gastrointestinal: Negative.   Genitourinary: Negative.   Musculoskeletal: Positive for back pain.  Skin: Negative.   Neurological: Negative.   Endo/Heme/Allergies: Negative.   Psychiatric/Behavioral: Negative.      Physical Exam:  weight is 195 lb (88.5 kg). Her oral temperature is 97.9 F (36.6 C). Her blood pressure is 123/74 and her pulse is 110 (abnormal). Her respiration is 18 and oxygen saturation is 100%.   Wt Readings from Last 3 Encounters:  05/17/18 195 lb (88.5 kg)  05/06/18 197 lb (89.4 kg)  04/15/18 194 lb (88 kg)    Physical Exam Vitals signs reviewed.    Constitutional:      Comments: Breast exam shows bilateral mastectomies with implants.  These are well-healed.  There is no erythema.  There is no warmth.  There is no palpable axillary lymph nodes.  HENT:     Head: Normocephalic and atraumatic.  Eyes:     Pupils: Pupils are equal, round, and reactive to light.  Neck:     Musculoskeletal: Normal range of motion.  Cardiovascular:     Rate and Rhythm: Normal rate and regular rhythm.     Heart sounds: Normal heart sounds.  Pulmonary:     Effort: Pulmonary effort is normal.     Breath sounds: Normal breath sounds.  Abdominal:     General: Bowel sounds are normal.     Palpations: Abdomen is soft.  Musculoskeletal: Normal range of motion.        General: No tenderness or deformity.     Comments: Back exam shows no tenderness over the spine.  There may be some slight spasms in the right lumbar paraspinal muscles.  There is some tenderness along the right flank.  Lymphadenopathy:     Cervical: No cervical adenopathy.  Skin:    General: Skin is warm and dry.     Findings: No erythema or rash.  Neurological:     Mental Status: She is alert and oriented to person, place, and time.  Psychiatric:        Behavior: Behavior normal.        Thought Content: Thought content normal.        Judgment: Judgment normal.      Lab Results  Component Value Date   WBC 7.0 05/06/2018   HGB 11.7 (L) 05/06/2018   HCT 37.0 05/06/2018   MCV 87.7 05/06/2018   PLT 161 05/06/2018   Lab Results  Component Value Date   FERRITIN 92 05/06/2018   IRON 39 (L) 05/06/2018   TIBC 294 05/06/2018   UIBC 255 05/06/2018   IRONPCTSAT 13 (L) 05/06/2018   Lab Results  Component Value Date   RBC 4.22 05/06/2018   No results found for: KPAFRELGTCHN, LAMBDASER, KAPLAMBRATIO No results found for: IGGSERUM, IGA, IGMSERUM No results found for: Ronnald Ramp, A1GS, A2GS, Tillman Sers, SPEI   Chemistry      Component Value Date/Time    NA 135 05/06/2018 0845   K 3.7 05/06/2018 0845   CL 99 05/06/2018 0845   CO2 28 05/06/2018 0845   BUN 14 05/06/2018 0845   CREATININE 0.78 05/06/2018 0845      Component Value Date/Time   CALCIUM 9.9 05/06/2018 0845   ALKPHOS 72 05/06/2018 0845   AST 28 05/06/2018 0845   ALT 21 05/06/2018 0845   BILITOT 0.4 05/06/2018 0845       Impression and Plan: Ms. Shell is a very pleasant 27 yo caucasian female with  locally advanced stage III ductal carcinoma of the right breast, HER-2+ and ER negative. She is BCRA positive.   We are going to have to see what happens with her consultation at [facility].  I know that Dr. Rip Harbour will use all of her resources to try to help Ms. Hepner.  As far as systemic therapy is concerned, we could certainly utilize Xeloda with [place].  This would be reasonable.  Again, she is BRCA positive.  I would think that the use of a PARP inhibitor is going to be indicated.  We will call in some oxycodone (10 mg p.o. every 6 hours as needed) along with Mobic (50 mg p.o. daily with food) and Skelaxin (800 mg p.o. 3 times daily as needed) to help with her pain.  We gave her some subcutaneous Dilaudid (2 mg) and oral Decadron (20 mg) to try to help with her discomfort in the office.  She did not want to stay for any IV treatment.  Thankfully, a brother came with her.  We will see Ms. Orrick back once she goes to [employer].  I know the Dr. Rip Harbour will give me a call.  We spent about an hour with Ms. Stander today.  This was an incredibly difficult meeting for [place].  I just feel bad for her as she has 2 young boys that we really need to see her grow up with.  Thankfully, she has a very strong faith and I know that this will come in handy for her.  Volanda Napoleon, MD 12/23/20191:51 PM

## 2018-05-17 NOTE — Progress Notes (Signed)
Order received per Dr. Marin Olp to give pt Dilaudid 2 mg SQ and Decadron 20 mg PO now.

## 2018-05-18 ENCOUNTER — Telehealth: Payer: Self-pay | Admitting: Hematology & Oncology

## 2018-05-18 NOTE — Telephone Encounter (Signed)
I spoke with patient regarding appointment for PET Scan date/time/  This was ok's by Dr Marin Olp since there were no available appointments are available until 05/31/2018

## 2018-05-25 ENCOUNTER — Telehealth: Payer: Self-pay | Admitting: Hematology & Oncology

## 2018-05-25 ENCOUNTER — Other Ambulatory Visit: Payer: Self-pay | Admitting: Family

## 2018-05-25 DIAGNOSIS — C7951 Secondary malignant neoplasm of bone: Principal | ICD-10-CM

## 2018-05-25 DIAGNOSIS — C50919 Malignant neoplasm of unspecified site of unspecified female breast: Secondary | ICD-10-CM

## 2018-05-25 NOTE — Telephone Encounter (Signed)
Spoke with patient regarding CT CAP appointments scheduled for 05/28/2018 @ [facility]/ Patient will pick up CM at next appt per 12/31 sch msg

## 2018-05-27 ENCOUNTER — Ambulatory Visit: Payer: Medicaid Other | Admitting: Hematology & Oncology

## 2018-05-27 ENCOUNTER — Inpatient Hospital Stay (HOSPITAL_BASED_OUTPATIENT_CLINIC_OR_DEPARTMENT_OTHER): Payer: PRIVATE HEALTH INSURANCE | Admitting: Hematology & Oncology

## 2018-05-27 ENCOUNTER — Ambulatory Visit: Payer: Medicaid Other

## 2018-05-27 ENCOUNTER — Other Ambulatory Visit: Payer: Medicaid Other

## 2018-05-27 ENCOUNTER — Other Ambulatory Visit: Payer: Self-pay

## 2018-05-27 ENCOUNTER — Inpatient Hospital Stay: Payer: PRIVATE HEALTH INSURANCE

## 2018-05-27 ENCOUNTER — Inpatient Hospital Stay: Payer: PRIVATE HEALTH INSURANCE | Attending: Hematology & Oncology

## 2018-05-27 ENCOUNTER — Encounter: Payer: Self-pay | Admitting: Hematology & Oncology

## 2018-05-27 VITALS — BP 113/78 | HR 92 | Temp 98.3°F | Resp 18 | Wt 187.5 lb

## 2018-05-27 DIAGNOSIS — C50911 Malignant neoplasm of unspecified site of right female breast: Secondary | ICD-10-CM

## 2018-05-27 DIAGNOSIS — Z9013 Acquired absence of bilateral breasts and nipples: Secondary | ICD-10-CM | POA: Insufficient documentation

## 2018-05-27 DIAGNOSIS — Z1501 Genetic susceptibility to malignant neoplasm of breast: Secondary | ICD-10-CM | POA: Insufficient documentation

## 2018-05-27 DIAGNOSIS — D509 Iron deficiency anemia, unspecified: Secondary | ICD-10-CM | POA: Diagnosis not present

## 2018-05-27 DIAGNOSIS — C7951 Secondary malignant neoplasm of bone: Secondary | ICD-10-CM

## 2018-05-27 DIAGNOSIS — C50919 Malignant neoplasm of unspecified site of unspecified female breast: Secondary | ICD-10-CM

## 2018-05-27 DIAGNOSIS — Z171 Estrogen receptor negative status [ER-]: Principal | ICD-10-CM

## 2018-05-27 DIAGNOSIS — C50011 Malignant neoplasm of nipple and areola, right female breast: Secondary | ICD-10-CM

## 2018-05-27 DIAGNOSIS — Z5112 Encounter for antineoplastic immunotherapy: Secondary | ICD-10-CM | POA: Insufficient documentation

## 2018-05-27 DIAGNOSIS — C50111 Malignant neoplasm of central portion of right female breast: Secondary | ICD-10-CM

## 2018-05-27 DIAGNOSIS — D500 Iron deficiency anemia secondary to blood loss (chronic): Secondary | ICD-10-CM

## 2018-05-27 LAB — CBC WITH DIFFERENTIAL (CANCER CENTER ONLY)
Abs Immature Granulocytes: 0.01 10*3/uL (ref 0.00–0.07)
Basophils Absolute: 0 10*3/uL (ref 0.0–0.1)
Basophils Relative: 1 %
EOS PCT: 7 %
Eosinophils Absolute: 0.5 10*3/uL (ref 0.0–0.5)
HCT: 39.4 % (ref 36.0–46.0)
Hemoglobin: 12.8 g/dL (ref 12.0–15.0)
Immature Granulocytes: 0 %
Lymphocytes Relative: 30 %
Lymphs Abs: 1.9 10*3/uL (ref 0.7–4.0)
MCH: 27.8 pg (ref 26.0–34.0)
MCHC: 32.5 g/dL (ref 30.0–36.0)
MCV: 85.5 fL (ref 80.0–100.0)
MONO ABS: 0.5 10*3/uL (ref 0.1–1.0)
Monocytes Relative: 7 %
Neutro Abs: 3.6 10*3/uL (ref 1.7–7.7)
Neutrophils Relative %: 55 %
Platelet Count: 162 10*3/uL (ref 150–400)
RBC: 4.61 MIL/uL (ref 3.87–5.11)
RDW: 13.5 % (ref 11.5–15.5)
WBC Count: 6.5 10*3/uL (ref 4.0–10.5)
nRBC: 0 % (ref 0.0–0.2)

## 2018-05-27 LAB — CMP (CANCER CENTER ONLY)
ALK PHOS: 66 U/L (ref 38–126)
ALT: 23 U/L (ref 0–44)
AST: 33 U/L (ref 15–41)
Albumin: 4.2 g/dL (ref 3.5–5.0)
Anion gap: 10 (ref 5–15)
BUN: 19 mg/dL (ref 6–20)
CALCIUM: 9.8 mg/dL (ref 8.9–10.3)
CO2: 26 mmol/L (ref 22–32)
Chloride: 99 mmol/L (ref 98–111)
Creatinine: 0.88 mg/dL (ref 0.44–1.00)
GFR, Est AFR Am: >60 mL/min (ref >60)
Glucose, Bld: 93 mg/dL (ref 70–99)
Potassium: 3.3 mmol/L — ABNORMAL LOW (ref 3.5–5.1)
Sodium: 135 mmol/L (ref 135–145)
TOTAL PROTEIN: 8.5 g/dL — AB (ref 6.5–8.1)
Total Bilirubin: 0.4 mg/dL (ref 0.3–1.2)

## 2018-05-27 MED ORDER — ZOLEDRONIC ACID 4 MG/100ML IV SOLN
4.0000 mg | Freq: Once | INTRAVENOUS | Status: AC
  Administered 2018-05-27: 4 mg via INTRAVENOUS
  Filled 2018-05-27: qty 100

## 2018-05-27 MED ORDER — SODIUM CHLORIDE 0.9% FLUSH
10.0000 mL | INTRAVENOUS | Status: DC | PRN
  Administered 2018-05-27: 10 mL
  Filled 2018-05-27: qty 10

## 2018-05-27 MED ORDER — HEPARIN SOD (PORK) LOCK FLUSH 100 UNIT/ML IV SOLN
500.0000 [IU] | Freq: Once | INTRAVENOUS | Status: AC | PRN
  Administered 2018-05-27: 500 [IU]
  Filled 2018-05-27: qty 5

## 2018-05-27 MED ORDER — SODIUM CHLORIDE 0.9 % IV SOLN
Freq: Once | INTRAVENOUS | Status: AC
  Administered 2018-05-27: 10:00:00 via INTRAVENOUS
  Filled 2018-05-27: qty 250

## 2018-05-27 MED ORDER — ACETAMINOPHEN 325 MG PO TABS: 650.0000 mg | ORAL_TABLET | Freq: Once | ORAL | Status: DC

## 2018-05-27 MED ORDER — DIPHENHYDRAMINE HCL 25 MG PO CAPS: 25.0000 mg | ORAL_CAPSULE | Freq: Once | ORAL | Status: DC

## 2018-05-27 MED ORDER — SODIUM CHLORIDE 0.9 % IV SOLN
3.6000 mg/kg | Freq: Once | INTRAVENOUS | Status: AC
  Administered 2018-05-27: 300 mg via INTRAVENOUS
  Filled 2018-05-27: qty 15

## 2018-05-27 NOTE — Progress Notes (Signed)
Hematology and Oncology Follow Up Visit  Sophia [name] [id number] Oct 18, 1990 28 y.o. 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  I must say, that Sophia [name] is looking a whole lot better than when I last saw her.  When I saw her before Christmas, we unfortunately found that Sophia [name] had metastatic disease.  Sophia [name] had back pain a couple weeks earlier and an MRI was done which showed a pathologic fracture at T11.  Sophia [name] is set up for her CT scans and PET scan for the next day or so.  Sophia [name] went to [facility].  As always, Dr. Rip Harbour, did a fantastic job and saw her quickly.  I think that Sophia [name] is going to have a vertebroplasty and biopsy at [facility].  We definitely need to get tissue so that we can send off molecular studies.  Dr. Rip Harbour is seen about a protocol that Sophia [name] will be able to qualify for.  Sophia [name] is doing much better with her pain.  I am so happy that Sophia [name] is much more functional now.  Sophia [name] has had no issues with nausea or vomiting.  Sophia [name] is now worried about the implants that Sophia [name] has with her breast reconstruction.  Apparently, the implants have been recalled because of the risk of lymphoma.  I told her that Sophia [name] probably needs to speak to her plastic surgeon, Dr. Iran Planas, regarding this.  I am not sure exactly what type of implants Sophia [name] has in.  Sophia [name] did have a very nice Christmas.  Sophia [name]  was able to take her 2 boys to the mountains to go [place] ski [place].  Her appetite is doing better.  Overall, her performance status right now is ECOG 0.    Medications:  Allergies as of 05/27/2018   No Known Allergies     Medication List       Accurate as of May 27, 2018 12:55 PM. Always use your most recent med list.        ado-trastuzumab emtansine 100 MG Solr Commonly known as:  KADCYLA Inject 15 mLs (300 mg total) into the vein every 21 ( twenty-one) days.   meloxicam 15 MG tablet Commonly known as:  MOBIC Take 1 tablet (15 mg total) by mouth daily.   Oxycodone HCl 10 MG Tabs Take 1 tablet (10 mg total) by mouth every 6 (six) hours as needed for severe pain.       Allergies: No Known Allergies  Past Medical History, Surgical history, Social history, and Family History were reviewed and updated.  Review of Systems: Review of Systems  Constitutional: Negative.   HENT: Negative.   Eyes: Negative.   Respiratory: Negative.   Cardiovascular: Negative.   Gastrointestinal: Negative.   Genitourinary: Negative.   Musculoskeletal: Positive for back pain.  Skin: Negative.   Neurological: Negative.   Endo/Heme/Allergies: Negative.   Psychiatric/Behavioral: Negative.      Physical Exam:  weight is 187 lb  8 oz (85 kg). Her oral temperature is 98.3 F (36.8 C). Her blood pressure is 113/78 and her pulse is 92. Her respiration is 18 and oxygen saturation is 98%.   Wt Readings from Last 3 Encounters:  05/27/18 187 lb 8 oz (85 kg)  05/17/18 195 lb (88.5 kg)  05/06/18 197 lb (89.4 kg)    Physical Exam Vitals signs reviewed.  Constitutional:      Comments: Breast exam shows bilateral mastectomies with implants.  These are well-healed.  There is no erythema.  There is no warmth.  There is no palpable axillary lymph nodes.  HENT:     Head: Normocephalic and atraumatic.  Eyes:     Pupils: Pupils are equal, round, and reactive to light.  Neck:      Musculoskeletal: Normal range of motion.  Cardiovascular:     Rate and Rhythm: Normal rate and regular rhythm.     Heart sounds: Normal heart sounds.  Pulmonary:     Effort: Pulmonary effort is normal.     Breath sounds: Normal breath sounds.  Abdominal:     General: Bowel sounds are normal.     Palpations: Abdomen is soft.  Musculoskeletal: Normal range of motion.        General: No tenderness or deformity.     Comments: Back exam shows no tenderness over the spine.  There may be some slight spasms in the right lumbar paraspinal muscles.  There is some tenderness along the right flank.  Lymphadenopathy:     Cervical: No cervical adenopathy.  Skin:    General: Skin is warm and dry.     Findings: No erythema or rash.  Neurological:     Mental Status: Sophia [name] is alert and oriented to person, place, and time.  Psychiatric:        Behavior: Behavior normal.        Thought Content: Thought content normal.        Judgment: Judgment normal.      Lab Results  Component Value Date   WBC 6.5 05/27/2018   HGB 12.8 05/27/2018   HCT 39.4 05/27/2018   MCV 85.5 05/27/2018   PLT 162 05/27/2018   Lab Results  Component Value Date   FERRITIN 92 05/06/2018   IRON 39 (L) 05/06/2018   TIBC 294 05/06/2018   UIBC 255 05/06/2018   IRONPCTSAT 13 (L) 05/06/2018   Lab Results  Component Value Date   RBC 4.61 05/27/2018   No results found for: KPAFRELGTCHN, LAMBDASER, KAPLAMBRATIO No results found for: IGGSERUM, IGA, IGMSERUM No results found for: Odetta Pink, SPEI   Chemistry      Component Value Date/Time   NA 135 05/27/2018 0845   K 3.3 (L) 05/27/2018 0845   CL 99 05/27/2018 0845   CO2 26 05/27/2018 0845   BUN 19 05/27/2018 0845   CREATININE 0.88 05/27/2018 0845      Component Value Date/Time   CALCIUM 9.8 05/27/2018 0845   ALKPHOS 66 05/27/2018 0845   AST 33 05/27/2018 0845   ALT 23 05/27/2018 0845   BILITOT 0.4 05/27/2018  0845       Impression and Plan: Sophia [name] is a very pleasant 28 yo caucasian female with locally advanced stage III ductal carcinoma of the right breast, HER-2+ and ER negative. Sophia [name] is BCRA positive.   Hopefully, Sophia [name] will be able to get on protocol at [facility].  Given her young age and somewhat complicated breast cancer, given  the fact that Sophia [name] is HER-2 positive and also BRCA1 positive, there are definitely targets that can be utilized.  I will continue her on the Kadcyla for right now.  I think this would provide some benefit given that Sophia [name] is HER-2 positive.  I will start her on Zometa.  I think this would be reasonable.  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  We will treat her with Zometa every 3 months.  I would like to see her back in another 2 or 3 weeks.  I feel that Sophia [name] is can be quite busy given that Sophia [name] will be going out to [facility].  He will be fascinating to see what her x-rays show as far as her metastatic disease.     Volanda Napoleon, MD 1/2/202012:55 PM

## 2018-05-27 NOTE — Patient Instructions (Signed)
Zoledronic Acid injection (Hypercalcemia, Oncology) What is this medicine? ZOLEDRONIC ACID (ZOE le dron ik AS id) lowers the amount of calcium loss from bone. It is used to treat too much calcium in your blood from cancer. It is also used to prevent complications of cancer that has spread to the bone. This medicine may be used for other purposes; ask your health care provider or pharmacist if you have questions. COMMON BRAND NAME(S): Zometa What should I tell my health care provider before I take this medicine? They need to know if you have any of these conditions: -aspirin-sensitive asthma -cancer, especially if you are receiving medicines used to treat cancer -dental disease or wear dentures -infection -kidney disease -receiving corticosteroids like dexamethasone or prednisone -an unusual or allergic reaction to zoledronic acid, other medicines, foods, dyes, or preservatives -pregnant or trying to get pregnant -breast-feeding How should I use this medicine? This medicine is for infusion into a vein. It is given by a health care professional in a hospital or clinic setting. Talk to your pediatrician regarding the use of this medicine in children. Special care may be needed. Overdosage: If you think you have taken too much of this medicine contact a poison control center or emergency room at once. NOTE: This medicine is only for you. Do not share this medicine with others. What if I miss a dose? It is important not to miss your dose. Call your doctor or health care professional if you are unable to keep an appointment. What may interact with this medicine? -certain antibiotics given by injection -NSAIDs, medicines for pain and inflammation, like ibuprofen or naproxen -some diuretics like bumetanide, furosemide -teriparatide -thalidomide This list may not describe all possible interactions. Give your health care provider a list of all the medicines, herbs, non-prescription drugs, or  dietary supplements you use. Also tell them if you smoke, drink alcohol, or use illegal drugs. Some items may interact with your medicine. What should I watch for while using this medicine? Visit your doctor or health care professional for regular checkups. It may be some time before you see the benefit from this medicine. Do not stop taking your medicine unless your doctor tells you to. Your doctor may order blood tests or other tests to see how you are doing. Women should inform their doctor if they wish to become pregnant or think they might be pregnant. There is a potential for serious side effects to an unborn child. Talk to your health care professional or pharmacist for more information. You should make sure that you get enough calcium and vitamin D while you are taking this medicine. Discuss the foods you eat and the vitamins you take with your health care professional. Some people who take this medicine have severe bone, joint, and/or muscle pain. This medicine may also increase your risk for jaw problems or a broken thigh bone. Tell your doctor right away if you have severe pain in your jaw, bones, joints, or muscles. Tell your doctor if you have any pain that does not go away or that gets worse. Tell your dentist and dental surgeon that you are taking this medicine. You should not have major dental surgery while on this medicine. See your dentist to have a dental exam and fix any dental problems before starting this medicine. Take good care of your teeth while on this medicine. Make sure you see your dentist for regular follow-up appointments. What side effects may I notice from receiving this medicine? Side effects that   you should report to your doctor or health care professional as soon as possible: -allergic reactions like skin rash, itching or hives, swelling of the face, lips, or tongue -anxiety, confusion, or depression -breathing problems -changes in vision -eye pain -feeling faint or  lightheaded, falls -jaw pain, especially after dental work -mouth sores -muscle cramps, stiffness, or weakness -redness, blistering, peeling or loosening of the skin, including inside the mouth -trouble passing urine or change in the amount of urine Side effects that usually do not require medical attention (report to your doctor or health care professional if they continue or are bothersome): -bone, joint, or muscle pain -constipation -diarrhea -fever -hair loss -irritation at site where injected -loss of appetite -nausea, vomiting -stomach upset -trouble sleeping -trouble swallowing -weak or tired This list may not describe all possible side effects. Call your doctor for medical advice about side effects. You may report side effects to FDA at 1-800-FDA-1088. Where should I keep my medicine? This drug is given in a hospital or clinic and will not be stored at home. NOTE: This sheet is a summary. It may not cover all possible information. If you have questions about this medicine, talk to your doctor, pharmacist, or health care provider.  2019 Elsevier/Gold Standard (2013-10-08 14:19:39)  [facility] Discharge Instructions for Patients Receiving Chemotherapy  Today you received the following chemotherapy agents Kadycla.  To help prevent nausea and vomiting after your treatment, we encourage you to take your nausea medication as directed.  If you develop nausea and vomiting that is not controlled by your nausea medication, call the clinic.   BELOW ARE SYMPTOMS THAT SHOULD BE REPORTED IMMEDIATELY:  *FEVER GREATER THAN 100.5 F  *CHILLS WITH OR WITHOUT FEVER  NAUSEA AND VOMITING THAT IS NOT CONTROLLED WITH YOUR NAUSEA MEDICATION  *UNUSUAL SHORTNESS OF BREATH  *UNUSUAL BRUISING OR BLEEDING  TENDERNESS IN MOUTH AND THROAT WITH OR WITHOUT PRESENCE OF ULCERS  *URINARY PROBLEMS  *BOWEL PROBLEMS  UNUSUAL RASH Items with * indicate a potential emergency and  should be followed up as soon as possible.  Feel free to call the clinic you have any questions or concerns. The clinic phone number is [phone].  Please show the [place] at check-in to the Emergency Department and triage nurse.

## 2018-05-28 ENCOUNTER — Ambulatory Visit (HOSPITAL_BASED_OUTPATIENT_CLINIC_OR_DEPARTMENT_OTHER): Payer: Medicaid Other

## 2018-05-28 ENCOUNTER — Telehealth: Payer: Self-pay | Admitting: Hematology & Oncology

## 2018-05-28 LAB — IRON AND TIBC
IRON: 79 ug/dL (ref 41–142)
Saturation Ratios: 31 % (ref 21–57)
TIBC: 253 ug/dL (ref 236–444)
UIBC: 174 ug/dL (ref 120–384)

## 2018-05-28 LAB — FERRITIN: FERRITIN: 567 ng/mL — AB (ref 11–307)

## 2018-05-28 NOTE — Telephone Encounter (Signed)
LMVM for patient regarding CT scans scheduled/ date/time/location was given/  Patient also has contrast material

## 2018-05-31 ENCOUNTER — Encounter (HOSPITAL_COMMUNITY): Payer: Self-pay

## 2018-05-31 ENCOUNTER — Ambulatory Visit (HOSPITAL_BASED_OUTPATIENT_CLINIC_OR_DEPARTMENT_OTHER): Payer: PRIVATE HEALTH INSURANCE

## 2018-05-31 ENCOUNTER — Ambulatory Visit (HOSPITAL_COMMUNITY)
Admission: RE | Admit: 2018-05-31 | Discharge: 2018-05-31 | Disposition: A | Discharge status: Home / Self Care | Payer: PRIVATE HEALTH INSURANCE | Source: Ambulatory Visit | Attending: Hematology & Oncology | Admitting: Hematology & Oncology

## 2018-05-31 ENCOUNTER — Inpatient Hospital Stay (HOSPITAL_COMMUNITY): Admission: RE | Admit: 2018-05-31 | Payer: Medicaid Other | Source: Ambulatory Visit

## 2018-05-31 ENCOUNTER — Ambulatory Visit (HOSPITAL_COMMUNITY)
Admission: RE | Admit: 2018-05-31 | Discharge: 2018-05-31 | Disposition: A | Discharge status: Home / Self Care | Payer: PRIVATE HEALTH INSURANCE | Source: Ambulatory Visit | Attending: Family | Admitting: Family

## 2018-05-31 ENCOUNTER — Ambulatory Visit (HOSPITAL_COMMUNITY): Payer: PRIVATE HEALTH INSURANCE

## 2018-05-31 DIAGNOSIS — C50919 Malignant neoplasm of unspecified site of unspecified female breast: Secondary | ICD-10-CM | POA: Insufficient documentation

## 2018-05-31 DIAGNOSIS — C50111 Malignant neoplasm of central portion of right female breast: Secondary | ICD-10-CM | POA: Insufficient documentation

## 2018-05-31 DIAGNOSIS — C7951 Secondary malignant neoplasm of bone: Principal | ICD-10-CM

## 2018-05-31 DIAGNOSIS — Z171 Estrogen receptor negative status [ER-]: Secondary | ICD-10-CM | POA: Diagnosis present

## 2018-05-31 LAB — GLUCOSE, CAPILLARY: Glucose-Capillary: 73 mg/dL (ref 70–99)

## 2018-05-31 MED ORDER — IOHEXOL 300 MG/ML  SOLN
100.0000 mL | Freq: Once | INTRAMUSCULAR | Status: AC | PRN
  Administered 2018-05-31: 100 mL via INTRAVENOUS

## 2018-05-31 MED ORDER — FLUDEOXYGLUCOSE F - 18 (FDG) INJECTION
9.6000 | Freq: Once | INTRAVENOUS | Status: AC | PRN
  Administered 2018-05-31: 9.6 via INTRAVENOUS

## 2018-05-31 MED ORDER — SODIUM CHLORIDE (PF) 0.9 % IJ SOLN
INTRAMUSCULAR | Status: AC
  Filled 2018-05-31: qty 50

## 2018-05-31 MED ORDER — IOHEXOL 300 MG/ML  SOLN
30.0000 mL | Freq: Once | INTRAMUSCULAR | Status: AC | PRN
  Administered 2018-05-31: 30 mL via ORAL

## 2018-06-07 ENCOUNTER — Other Ambulatory Visit: Payer: Self-pay | Admitting: Hematology & Oncology

## 2018-06-07 ENCOUNTER — Encounter: Payer: Self-pay | Admitting: Hematology & Oncology

## 2018-06-07 MED ORDER — OLAPARIB 150 MG PO TABS: 300.0000 mg | ORAL_TABLET | Freq: Two times a day (BID) | ORAL | 2 refills | Status: DC

## 2018-06-07 NOTE — Progress Notes (Signed)
DISCONTINUE ON PATHWAY REGIMEN - Breast     A cycle is every 21 days:     Ado-trastuzumab emtansine   **Always confirm dose/schedule in your pharmacy ordering system**  REASON: Disease Progression PRIOR TREATMENT: [id number]: Ado-trastuzumab Emtansine 3.6 mg/kg q21 Days x 14 Cycles TREATMENT RESPONSE: Progressive Disease (PD)  START OFF PATHWAY REGIMEN - Breast   [id number]:Atezolizumab 1,200 mg q21 Days:   A cycle is every 21 days:     Atezolizumab   **Always confirm dose/schedule in your pharmacy ordering system**  Patient Characteristics: Distant Metastases or Locoregional Recurrent Disease - Unresected or Locally Advanced Unresectable Disease Progressing after Neoadjuvant and Local Therapies, HER2 Positive, ER Negative/Unknown, Chemotherapy, Third Line Therapeutic Status: Distant Metastases BRCA Mutation Status: Present ([place]) ER Status: Negative (-) HER2 Status: Positive (+) PR Status: Negative (-) Line of Therapy: Third Line Intent of Therapy: Non-Curative / Palliative Intent, Discussed with Patient

## 2018-06-08 ENCOUNTER — Other Ambulatory Visit: Payer: Self-pay | Admitting: Family

## 2018-06-08 ENCOUNTER — Telehealth: Payer: Self-pay | Admitting: *Deleted

## 2018-06-08 ENCOUNTER — Encounter: Payer: Self-pay | Admitting: Hematology & Oncology

## 2018-06-08 ENCOUNTER — Telehealth: Payer: Self-pay | Admitting: Pharmacist

## 2018-06-08 DIAGNOSIS — K8020 Calculus of gallbladder without cholecystitis without obstruction: Secondary | ICD-10-CM

## 2018-06-08 DIAGNOSIS — C7951 Secondary malignant neoplasm of bone: Secondary | ICD-10-CM

## 2018-06-08 DIAGNOSIS — C50919 Malignant neoplasm of unspecified site of unspecified female breast: Secondary | ICD-10-CM

## 2018-06-08 DIAGNOSIS — R1011 Right upper quadrant pain: Secondary | ICD-10-CM

## 2018-06-08 MED ORDER — MORPHINE SULFATE 15 MG PO TABS: 15.0000 mg | ORAL_TABLET | Freq: Four times a day (QID) | ORAL | 0 refills | Status: DC | PRN

## 2018-06-08 NOTE — Telephone Encounter (Signed)
Patient notified per order of S. [place] NP that prescription for MSIR and Mobic has been sent to [facility] in [place]. Pt appreciative of call and has no questions at this time.

## 2018-06-08 NOTE — Telephone Encounter (Signed)
Call placed to patient regarding mychart message.  Dr. Marin Olp called and left voicemail for pt yesterday evening. Patient informed that order for [place] of abdomen will be placed today.  Pt requests it to be done at whatever facility can get it done the earliest.  Scheduling notified.  Pt states that she does not currently have Mobic at home.  Instructed pt that I would speak with [place] NP and call her back with further instructions.  Pt appreciative of call.

## 2018-06-08 NOTE — Telephone Encounter (Signed)
Oral Oncology Pharmacist Encounter  Received new prescription for Lynparza (olaparib) for the treatment of metastatic breast cancer HER2+/ER negative, BRCA1 germline postive in conjunction with IV therapy regimen, planned duration until disease progression or unacceptable drug toxicity. Pending results of 06/04/2018 spinal met biopsy performed at [facility].  CBC from 06/11/2018 assessed, no relevant lab abnormalities. Prescription dose and frequency assessed.   Current medication list in Epic reviewed, no DDIs with olaparib identified.  Prescription has been e-scribed to the [facility] for benefits analysis and approval.  Oral Oncology Clinic will continue to follow for insurance authorization, copayment issues, initial counseling and start date.  Darl Pikes, PharmD, BCPS, [facility] Hematology/Oncology Clinical Pharmacist [facility] Oral [place] [facility] [phone]  06/08/2018 4:28 PM

## 2018-06-08 NOTE — Progress Notes (Signed)
Abdominal US ordered to better evaluate gallbladder and stones. Also patient requested trying something else for pain due to the oxycodone giving her GI upset. I spoke with Lattie Haw in Pharmacy and we will discontinue the Oxycodone and try MSIR 15 mg PO every 4-6 hours as needed for pain. She has not started the Mobic and will pick that up today as well. Follow-up with Dr. Marin Olp is scheduled for Friday. She will contact our office with any other questions or concerns.

## 2018-06-10 ENCOUNTER — Ambulatory Visit (HOSPITAL_COMMUNITY)
Admission: RE | Admit: 2018-06-10 | Discharge: 2018-06-10 | Disposition: A | Discharge status: Home / Self Care | Payer: PRIVATE HEALTH INSURANCE | Source: Ambulatory Visit | Attending: Family | Admitting: Family

## 2018-06-10 ENCOUNTER — Other Ambulatory Visit: Payer: Self-pay | Admitting: Family

## 2018-06-10 ENCOUNTER — Encounter: Payer: Self-pay | Admitting: Family

## 2018-06-10 ENCOUNTER — Ambulatory Visit (HOSPITAL_COMMUNITY): Payer: Medicaid Other

## 2018-06-10 DIAGNOSIS — C50919 Malignant neoplasm of unspecified site of unspecified female breast: Secondary | ICD-10-CM

## 2018-06-10 DIAGNOSIS — C7951 Secondary malignant neoplasm of bone: Secondary | ICD-10-CM

## 2018-06-10 DIAGNOSIS — R1011 Right upper quadrant pain: Secondary | ICD-10-CM | POA: Insufficient documentation

## 2018-06-10 DIAGNOSIS — K8020 Calculus of gallbladder without cholecystitis without obstruction: Secondary | ICD-10-CM | POA: Insufficient documentation

## 2018-06-11 ENCOUNTER — Inpatient Hospital Stay: Payer: PRIVATE HEALTH INSURANCE

## 2018-06-11 ENCOUNTER — Encounter: Payer: Self-pay | Admitting: Hematology & Oncology

## 2018-06-11 ENCOUNTER — Other Ambulatory Visit: Payer: Self-pay

## 2018-06-11 ENCOUNTER — Inpatient Hospital Stay (HOSPITAL_BASED_OUTPATIENT_CLINIC_OR_DEPARTMENT_OTHER): Payer: PRIVATE HEALTH INSURANCE | Admitting: Hematology & Oncology

## 2018-06-11 VITALS — BP 110/72 | HR 80 | Temp 97.6°F | Resp 18 | Wt 183.0 lb

## 2018-06-11 DIAGNOSIS — Z5112 Encounter for antineoplastic immunotherapy: Secondary | ICD-10-CM | POA: Diagnosis not present

## 2018-06-11 DIAGNOSIS — Z95828 Presence of other vascular implants and grafts: Secondary | ICD-10-CM

## 2018-06-11 DIAGNOSIS — C7951 Secondary malignant neoplasm of bone: Principal | ICD-10-CM

## 2018-06-11 DIAGNOSIS — C50919 Malignant neoplasm of unspecified site of unspecified female breast: Secondary | ICD-10-CM

## 2018-06-11 LAB — CBC WITH DIFFERENTIAL (CANCER CENTER ONLY)
Abs Immature Granulocytes: 0.01 10*3/uL (ref 0.00–0.07)
Basophils Absolute: 0 10*3/uL (ref 0.0–0.1)
Basophils Relative: 1 %
Eosinophils Absolute: 0.4 10*3/uL (ref 0.0–0.5)
Eosinophils Relative: 6 %
HCT: 37.1 % (ref 36.0–46.0)
Hemoglobin: 11.8 g/dL — ABNORMAL LOW (ref 12.0–15.0)
Immature Granulocytes: 0 %
Lymphocytes Relative: 37 %
Lymphs Abs: 2.1 10*3/uL (ref 0.7–4.0)
MCH: 27.8 pg (ref 26.0–34.0)
MCHC: 31.8 g/dL (ref 30.0–36.0)
MCV: 87.5 fL (ref 80.0–100.0)
MONO ABS: 0.7 10*3/uL (ref 0.1–1.0)
Monocytes Relative: 12 %
NEUTROS ABS: 2.5 10*3/uL (ref 1.7–7.7)
Neutrophils Relative %: 44 %
Platelet Count: 178 10*3/uL (ref 150–400)
RBC: 4.24 MIL/uL (ref 3.87–5.11)
RDW: 14.2 % (ref 11.5–15.5)
WBC Count: 5.6 10*3/uL (ref 4.0–10.5)
nRBC: 0 % (ref 0.0–0.2)

## 2018-06-11 LAB — CMP (CANCER CENTER ONLY)
ALT: 25 U/L (ref 0–44)
AST: 40 U/L (ref 15–41)
Albumin: 4.3 g/dL (ref 3.5–5.0)
Alkaline Phosphatase: 68 U/L (ref 38–126)
Anion gap: 9 (ref 5–15)
BUN: 12 mg/dL (ref 6–20)
CO2: 27 mmol/L (ref 22–32)
Calcium: 10.1 mg/dL (ref 8.9–10.3)
Chloride: 102 mmol/L (ref 98–111)
Creatinine: 0.69 mg/dL (ref 0.44–1.00)
GFR, Estimated: >60 mL/min (ref >60)
Glucose, Bld: 98 mg/dL (ref 70–99)
Potassium: 3.6 mmol/L (ref 3.5–5.1)
Sodium: 138 mmol/L (ref 135–145)
Total Bilirubin: 0.4 mg/dL (ref 0.3–1.2)
Total Protein: 8 g/dL (ref 6.5–8.1)

## 2018-06-11 MED ORDER — SODIUM CHLORIDE 0.9% FLUSH
10.0000 mL | INTRAVENOUS | Status: AC | PRN
  Administered 2018-06-11: 10 mL via INTRAVENOUS
  Filled 2018-06-11: qty 10

## 2018-06-11 MED ORDER — HEPARIN SOD (PORK) LOCK FLUSH 100 UNIT/ML IV SOLN
500.0000 [IU] | Freq: Once | INTRAVENOUS | Status: AC
  Administered 2018-06-11: 500 [IU] via INTRAVENOUS
  Filled 2018-06-11: qty 5

## 2018-06-11 NOTE — Telephone Encounter (Signed)
Patient is in office today and provider will go over scan results

## 2018-06-11 NOTE — Addendum Note (Signed)
Addended by: Melton Krebs on: 06/11/2018 03:20 PM   Modules accepted: Orders, SmartSet

## 2018-06-12 LAB — CANCER ANTIGEN 27.29: CAN 27.29: 17 U/mL (ref 0.0–38.6)

## 2018-06-14 ENCOUNTER — Encounter: Payer: Self-pay | Admitting: Hematology & Oncology

## 2018-06-15 ENCOUNTER — Telehealth: Payer: Self-pay | Admitting: Pharmacist

## 2018-06-15 ENCOUNTER — Other Ambulatory Visit: Payer: Self-pay | Admitting: *Deleted

## 2018-06-15 NOTE — Progress Notes (Signed)
Hematology and Oncology Follow Up Visit  Sophia [name] [id number] March 02, 1991 28 y.o. 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  She is having much less pain in the back.  She has been seen at [facility].  She underwent kyphoplasty.  She had a biopsy.  This was a week ago.  I do not have any biopsy results.  She is worried that she actually has lymphoma.  She is very worried about the implants that she had put in.  She had a PET scan done.  I reviewed the PET scan with she and her mom.  We talked about the different therapeutic options.  She is BRCA positive.  She is HER-2 positive.  I would really like to use a PARP inhibitor but unfortunately, we cannot use this because she is HER-2 positive.  I just wonder if she truly is HER-2 positive as she really had a lot of disease left at surgery despite having anti-HER-2 therapy.  She was on Kadcyla for adjuvant therapy and yet she had progressive disease.  I thought about using Xeloda with lapatinib.  I think this would be reasonable.  She wants to have another biopsy done.  She has ample adenopathy in the right cervical/supraclavicular region along with the right axilla.  She is having a tough time with this situation emotionally.  We will have to make a referral to psychology so that she can talk all this out with somebody.  She has  2 young children.  I know that this is an incredibly tough problem for her.  Her appetite is doing little bit better.  There is no change in bowel or bladder habits.  Of note, her CA 27.29 is only 17 which is a little bit unusual for the extent of her metastatic disease.  Currently, her performance status is ECOG 1.  Medications:  Allergies as of 06/11/2018   No Known Allergies     Medication List       Accurate as of June 11, 2018 11:59 PM. Always use your most recent med list.        ado-trastuzumab emtansine 100 MG Solr Commonly known as:  KADCYLA Inject 15 mLs (300 mg total) into the vein every 21 ( twenty-one) days.   meloxicam 15 MG tablet Commonly known as:  MOBIC Take 1 tablet (15 mg total) by mouth daily.   morphine 15 MG tablet Commonly known as:  MSIR Take 1 tablet (15 mg total) by mouth every 6 (six) hours as needed for severe pain.   olaparib 150 MG tablet Commonly known as:  LYNPARZA Take 2 tablets (300 mg total) by mouth 2 (two) times daily. Swallow whole.       Allergies: No Known Allergies  Past Medical History, Surgical history, Social history, and Family History were reviewed and updated.  Review of Systems: Review of Systems  Constitutional: Negative.   HENT: Negative.   Eyes: Negative.  Respiratory: Negative.   Cardiovascular: Negative.   Gastrointestinal: Negative.   Genitourinary: Negative.   Musculoskeletal: Positive for back pain.  Skin: Negative.   Neurological: Negative.   Endo/Heme/Allergies: Negative.   Psychiatric/Behavioral: Negative.      Physical Exam:  weight is 183 lb (83 kg). Her oral temperature is 97.6 F (36.4 C). Her blood pressure is 110/72 and her pulse is 80. Her respiration is 18 and oxygen saturation is 100%.   Wt Readings from Last 3 Encounters:  06/11/18 183 lb (83 kg)  05/27/18 187 lb 8 oz (85 kg)  05/17/18 195 lb (88.5 kg)    Physical Exam Vitals signs reviewed.  Constitutional:       Comments: Breast exam shows bilateral mastectomies with implants.  These are well-healed.  There is no erythema.  There is no warmth.  There is no palpable axillary lymph nodes.  She does have some fullness in the right axilla.  HENT:     Head: Normocephalic and atraumatic.  Eyes:     Pupils: Pupils are equal, round, and reactive to light.  Neck:     Musculoskeletal: Normal range of motion.     Comments: She has some fullness in the right cervical and supraclavicular region.  This probably is adenopathy. Cardiovascular:     Rate and Rhythm: Normal rate and regular rhythm.     Heart sounds: Normal heart sounds.  Pulmonary:     Effort: Pulmonary effort is normal.     Breath sounds: Normal breath sounds.  Abdominal:     General: Bowel sounds are normal.     Palpations: Abdomen is soft.  Musculoskeletal: Normal range of motion.        General: No tenderness or deformity.     Comments: Back exam shows no tenderness over the spine.  There may be some slight spasms in the right lumbar paraspinal muscles.  There is some tenderness along the right flank.  Lymphadenopathy:     Cervical: No cervical adenopathy.  Skin:    General: Skin is warm and dry.     Findings: No erythema or rash.  Neurological:     Mental Status: She is alert and oriented to person, place, and time.  Psychiatric:        Behavior: Behavior normal.        Thought Content: Thought content normal.        Judgment: Judgment normal.      Lab Results  Component Value Date   WBC 5.6 06/11/2018   HGB 11.8 (L) 06/11/2018   HCT 37.1 06/11/2018   MCV 87.5 06/11/2018   PLT 178 06/11/2018   Lab Results  Component Value Date   FERRITIN 567 (H) 05/27/2018   IRON 79 05/27/2018   TIBC 253 05/27/2018   UIBC 174 05/27/2018   IRONPCTSAT 31 05/27/2018   Lab Results  Component Value Date   RBC 4.24 06/11/2018   No results found for: KPAFRELGTCHN, LAMBDASER, KAPLAMBRATIO No results found for: IGGSERUM, IGA, IGMSERUM No  results found for: Odetta Pink, SPEI   Chemistry      Component Value Date/Time   NA 138 06/11/2018 1111   K 3.6 06/11/2018 1111   CL 102 06/11/2018 1111   CO2 27 06/11/2018 1111   BUN 12 06/11/2018 1111   CREATININE 0.69 06/11/2018 1111      Component Value Date/Time   CALCIUM 10.1 06/11/2018 1111   ALKPHOS 68 06/11/2018 1111   AST 40 06/11/2018  1111   ALT 25 06/11/2018 1111   BILITOT 0.4 06/11/2018 1111       Impression and Plan: Ms. Wareing is a very pleasant 28 yo caucasian female with locally advanced stage III ductal carcinoma of the right breast, HER-2+ and ER negative. She is BCRA positive.   I spoke with her oncologist at [facility].  She gave me some very good ideas as to how we try to help her.  She thought that using immunotherapy along with Avastin would not be a bad idea.  Again, if we can just use olaparib, this would be also quite useful I think.  Hopefully, we will get the pathology report back from [facility] this week.  This is been to be incredibly important for [place].  She is going to need radiation treatments.  I know that she is somewhat reluctant to try radiation.  She refused adjuvant radiation to the right chest wall and right axilla.  In reality, I do not think that this would have made a difference with respect to her having recurrent disease.  I am somewhat troubled by the fact that her CA 27.29 is normal.  I would not think that she has something new that has occurred with respect to malignancy.  However, in reality, if she actually had lymphoma, this would be much, much more treatable and curable.  Again, we will have to await the pathology report from [facility].  I will see about getting some additional biopsies here.  She is going to need some radiation to the lumbar spine area.  I will speak with Dr. Sondra Come about this.  I spent about an hour and a half with the she and her mom today.  All the time spent  face-to-face.  I reviewed all of her labs.  I reviewed her x-rays.  I help coordinate future care for her.   Volanda Napoleon, MD 1/21/202010:03 AM

## 2018-06-16 ENCOUNTER — Encounter: Payer: Self-pay | Admitting: *Deleted

## 2018-06-16 ENCOUNTER — Encounter: Payer: Self-pay | Admitting: Hematology & Oncology

## 2018-06-16 ENCOUNTER — Telehealth: Payer: Self-pay | Admitting: Hematology & Oncology

## 2018-06-16 ENCOUNTER — Encounter: Payer: Self-pay | Admitting: Family

## 2018-06-16 ENCOUNTER — Ambulatory Visit (HOSPITAL_COMMUNITY)
Admission: RE | Admit: 2018-06-16 | Discharge: 2018-06-16 | Disposition: A | Discharge status: Home / Self Care | Payer: Medicaid Other | Source: Ambulatory Visit | Attending: Family | Admitting: Family

## 2018-06-16 DIAGNOSIS — C50919 Malignant neoplasm of unspecified site of unspecified female breast: Secondary | ICD-10-CM | POA: Insufficient documentation

## 2018-06-16 DIAGNOSIS — C7951 Secondary malignant neoplasm of bone: Secondary | ICD-10-CM | POA: Diagnosis present

## 2018-06-16 DIAGNOSIS — K8020 Calculus of gallbladder without cholecystitis without obstruction: Secondary | ICD-10-CM | POA: Insufficient documentation

## 2018-06-16 MED ORDER — TECHNETIUM TC 99M MEBROFENIN IV KIT
5.2000 | PACK | Freq: Once | INTRAVENOUS | Status: AC | PRN
  Administered 2018-06-16: 5.2 via INTRAVENOUS

## 2018-06-16 MED FILL — LYNPARZA 150 MG TABLET: 150 | 30 days supply | Qty: 120 | Fill #0

## 2018-06-16 NOTE — Telephone Encounter (Signed)
Appointments for 1/23 have been cancelled and Sophia [name] will notify patient per 1/22 staff message

## 2018-06-16 NOTE — Telephone Encounter (Signed)
I called and LMVM for patient with date/time of [place] Core Bx.  I also sent a in basket message to Dr Marin Olp advising him of this date/time

## 2018-06-16 NOTE — Telephone Encounter (Signed)
Oral Chemotherapy Pharmacist Encounter  Sophia [name] will be delivered to Sophia [name] on 06/17/2018.  Patient Education I spoke with patient for overview of new oral chemotherapy medication: Lynparza (olaparib) for the treatment of metastatic breast cancer HER2+/ER negative, BRCA1 germline postive in conjunction with IV therapy regimen, planned duration until disease progression or unacceptable drug toxicity.    Counseled patient on administration, dosing, side effects, monitoring, drug-food interactions, safe handling, storage, and disposal. Patient will take 2 tablets (300 mg total) by mouth 2 (two) times daily.  Side effects include but not limited to: N/V, decreased hgb/plt/wbc, fatigue, diarrhea.    Sophia [name] is categorized as moderately to highly emetogenic by NCCN, Sophia [name] state she had anti-nausea medication on hand to use prn.  Reviewed with patient importance of keeping a medication schedule and plan for any missed doses.  Sophia [name] voiced understanding and appreciation. All questions answered. Medication handout placed in the mail.  Provided patient with Oral [facility] phone number. Patient knows to call the office with questions or concerns. Oral Chemotherapy Navigation Clinic will continue to follow.  Darl Pikes, PharmD, BCPS, [facility] Hematology/Oncology Clinical Pharmacist [facility] Oral [place] [facility] [phone]  06/16/2018 2:39 PM

## 2018-06-17 ENCOUNTER — Ambulatory Visit: Payer: Medicaid Other | Admitting: Hematology & Oncology

## 2018-06-17 ENCOUNTER — Ambulatory Visit: Payer: Medicaid Other

## 2018-06-17 ENCOUNTER — Telehealth: Payer: Self-pay | Admitting: Hematology & Oncology

## 2018-06-17 ENCOUNTER — Other Ambulatory Visit: Payer: Medicaid Other

## 2018-06-17 ENCOUNTER — Encounter: Payer: Self-pay | Admitting: Hematology & Oncology

## 2018-06-17 ENCOUNTER — Ambulatory Visit (INDEPENDENT_AMBULATORY_CARE_PROVIDER_SITE_OTHER): Payer: PRIVATE HEALTH INSURANCE | Admitting: Psychiatry

## 2018-06-17 DIAGNOSIS — F4323 Adjustment disorder with mixed anxiety and depressed mood: Secondary | ICD-10-CM

## 2018-06-17 NOTE — Telephone Encounter (Signed)
I called and spoke with patient regarding [place] Core Bx appointment date/time change per 1/22 in basket message from Dr Kathlene Cote he was able to move up appt .  Patient was given date/time and instructions/  She will be NPO after midnight and also will need a driver

## 2018-06-21 ENCOUNTER — Ambulatory Visit: Payer: Medicaid Other

## 2018-06-21 ENCOUNTER — Ambulatory Visit
Admission: RE | Admit: 2018-06-21 | Discharge: 2018-06-21 | Disposition: A | Discharge status: Home / Self Care | Payer: Medicaid Other | Source: Ambulatory Visit | Attending: Radiation Oncology | Admitting: Radiation Oncology

## 2018-06-21 ENCOUNTER — Telehealth: Payer: Self-pay | Admitting: *Deleted

## 2018-06-21 ENCOUNTER — Encounter: Payer: Self-pay | Admitting: Hematology & Oncology

## 2018-06-21 NOTE — Telephone Encounter (Signed)
Call placed to patient to instruct her to start Lynparza per order of Dr. Marin Olp.  Patient states that she does not want to start Lynparza until all biopsy results are back and she speaks with Dr. Marin Olp on Wednesday.  Dr. Marin Olp notified.  Patient aware of appointment times for Wednesday, 06/23/2018.

## 2018-06-22 ENCOUNTER — Ambulatory Visit: Payer: PRIVATE HEALTH INSURANCE | Attending: Radiation Oncology

## 2018-06-22 ENCOUNTER — Ambulatory Visit: Payer: PRIVATE HEALTH INSURANCE | Attending: Radiation Oncology | Admitting: Radiation Oncology

## 2018-06-22 ENCOUNTER — Encounter: Payer: Self-pay | Admitting: Pharmacy Technician

## 2018-06-22 NOTE — Progress Notes (Signed)
The patient is approved for drug assistance by [place] for Tecentriq and Avastin. Enrollment is effective indefinitely until treatment ends or the patient is enrolled in insurance. Celso Amy provides drug replacement after each Date of Service.

## 2018-06-23 ENCOUNTER — Other Ambulatory Visit: Payer: Self-pay | Admitting: Radiology

## 2018-06-23 ENCOUNTER — Inpatient Hospital Stay: Payer: PRIVATE HEALTH INSURANCE

## 2018-06-23 ENCOUNTER — Inpatient Hospital Stay (HOSPITAL_BASED_OUTPATIENT_CLINIC_OR_DEPARTMENT_OTHER): Payer: PRIVATE HEALTH INSURANCE | Admitting: Hematology & Oncology

## 2018-06-23 VITALS — BP 108/65 | HR 82 | Temp 98.0°F | Resp 18 | Wt 183.0 lb

## 2018-06-23 DIAGNOSIS — C7951 Secondary malignant neoplasm of bone: Principal | ICD-10-CM

## 2018-06-23 DIAGNOSIS — C50911 Malignant neoplasm of unspecified site of right female breast: Secondary | ICD-10-CM | POA: Diagnosis not present

## 2018-06-23 DIAGNOSIS — Z1509 Genetic susceptibility to other malignant neoplasm: Secondary | ICD-10-CM | POA: Diagnosis not present

## 2018-06-23 DIAGNOSIS — Z1501 Genetic susceptibility to malignant neoplasm of breast: Secondary | ICD-10-CM

## 2018-06-23 DIAGNOSIS — Z5112 Encounter for antineoplastic immunotherapy: Secondary | ICD-10-CM | POA: Diagnosis not present

## 2018-06-23 DIAGNOSIS — C50111 Malignant neoplasm of central portion of right female breast: Secondary | ICD-10-CM

## 2018-06-23 DIAGNOSIS — Z171 Estrogen receptor negative status [ER-]: Secondary | ICD-10-CM

## 2018-06-23 DIAGNOSIS — C50919 Malignant neoplasm of unspecified site of unspecified female breast: Secondary | ICD-10-CM

## 2018-06-23 DIAGNOSIS — M545 Low back pain: Secondary | ICD-10-CM

## 2018-06-23 DIAGNOSIS — G8929 Other chronic pain: Secondary | ICD-10-CM

## 2018-06-23 DIAGNOSIS — D500 Iron deficiency anemia secondary to blood loss (chronic): Secondary | ICD-10-CM

## 2018-06-23 LAB — CMP (CANCER CENTER ONLY)
ALT: 22 U/L (ref 0–44)
ANION GAP: 9 (ref 5–15)
AST: 47 U/L — ABNORMAL HIGH (ref 15–41)
Albumin: 4.4 g/dL (ref 3.5–5.0)
Alkaline Phosphatase: 60 U/L (ref 38–126)
BUN: 15 mg/dL (ref 6–20)
CO2: 26 mmol/L (ref 22–32)
Calcium: 10.3 mg/dL (ref 8.9–10.3)
Chloride: 101 mmol/L (ref 98–111)
Creatinine: 0.68 mg/dL (ref 0.44–1.00)
GFR, Est AFR Am: >60 mL/min (ref >60)
GFR, Estimated: >60 mL/min (ref >60)
Glucose, Bld: 110 mg/dL — ABNORMAL HIGH (ref 70–99)
POTASSIUM: 3.7 mmol/L (ref 3.5–5.1)
SODIUM: 136 mmol/L (ref 135–145)
Total Bilirubin: 0.5 mg/dL (ref 0.3–1.2)
Total Protein: 8 g/dL (ref 6.5–8.1)

## 2018-06-23 LAB — CBC WITH DIFFERENTIAL (CANCER CENTER ONLY)
Abs Immature Granulocytes: 0.01 10*3/uL (ref 0.00–0.07)
Basophils Absolute: 0 10*3/uL (ref 0.0–0.1)
Basophils Relative: 0 %
EOS ABS: 0.2 10*3/uL (ref 0.0–0.5)
Eosinophils Relative: 5 %
HCT: 36 % (ref 36.0–46.0)
Hemoglobin: 11.6 g/dL — ABNORMAL LOW (ref 12.0–15.0)
Immature Granulocytes: 0 %
Lymphocytes Relative: 31 %
Lymphs Abs: 1.6 10*3/uL (ref 0.7–4.0)
MCH: 28.1 pg (ref 26.0–34.0)
MCHC: 32.2 g/dL (ref 30.0–36.0)
MCV: 87.2 fL (ref 80.0–100.0)
Monocytes Absolute: 0.5 10*3/uL (ref 0.1–1.0)
Monocytes Relative: 9 %
Neutro Abs: 2.8 10*3/uL (ref 1.7–7.7)
Neutrophils Relative %: 55 %
Platelet Count: 148 10*3/uL — ABNORMAL LOW (ref 150–400)
RBC: 4.13 MIL/uL (ref 3.87–5.11)
RDW: 14.1 % (ref 11.5–15.5)
WBC Count: 5 10*3/uL (ref 4.0–10.5)
nRBC: 0 % (ref 0.0–0.2)

## 2018-06-23 LAB — LACTATE DEHYDROGENASE: LDH: 578 U/L — ABNORMAL HIGH (ref 98–192)

## 2018-06-23 MED ORDER — SODIUM CHLORIDE 0.9 % IV SOLN
Freq: Once | INTRAVENOUS | Status: AC
  Administered 2018-06-23: 12:00:00 via INTRAVENOUS
  Filled 2018-06-23: qty 250

## 2018-06-23 MED ORDER — OXYCODONE HCL 10 MG PO TABS: 5.0000 mg | ORAL_TABLET | ORAL | 0 refills | Status: DC | PRN

## 2018-06-23 MED ORDER — FENTANYL 25 MCG/HR TD PT72: 1.0000 | MEDICATED_PATCH | TRANSDERMAL | 0 refills | Status: DC

## 2018-06-23 MED ORDER — SODIUM CHLORIDE 0.9 % IV SOLN: 15.0000 mg/kg | Freq: Once | INTRAVENOUS | Status: DC

## 2018-06-23 MED ORDER — HYDROMORPHONE HCL 1 MG/ML IJ SOLN
INTRAMUSCULAR | Status: AC
  Filled 2018-06-23: qty 2

## 2018-06-23 MED ORDER — HYDROMORPHONE HCL 1 MG/ML IJ SOLN
2.0000 mg | Freq: Once | INTRAMUSCULAR | Status: AC
  Administered 2018-06-23: 2 mg via INTRAVENOUS

## 2018-06-23 MED ORDER — SODIUM CHLORIDE 0.9 % IV SOLN
1200.0000 mg | Freq: Once | INTRAVENOUS | Status: AC
  Administered 2018-06-23: 1200 mg via INTRAVENOUS
  Filled 2018-06-23: qty 20

## 2018-06-23 MED ORDER — ZOLEDRONIC ACID 4 MG/100ML IV SOLN
4.0000 mg | Freq: Once | INTRAVENOUS | Status: DC
  Filled 2018-06-23: qty 100

## 2018-06-23 MED ORDER — SODIUM CHLORIDE 0.9% FLUSH
10.0000 mL | INTRAVENOUS | Status: DC | PRN
  Administered 2018-06-23: 10 mL
  Filled 2018-06-23: qty 10

## 2018-06-23 MED ORDER — HEPARIN SOD (PORK) LOCK FLUSH 100 UNIT/ML IV SOLN
500.0000 [IU] | Freq: Once | INTRAVENOUS | Status: AC | PRN
  Administered 2018-06-23: 500 [IU]
  Filled 2018-06-23: qty 5

## 2018-06-23 NOTE — Patient Instructions (Signed)
Atezolizumab injection What is this medicine? ATEZOLIZUMAB (a te zoe LIZ ue mab) is a monoclonal antibody. It is used to treat bladder cancer (urothelial cancer), non-small cell lung cancer, small cell lung cancer, and breast cancer. This medicine may be used for other purposes; ask your health care provider or pharmacist if you have questions. COMMON BRAND NAME(S): Tecentriq What should I tell my health care provider before I take this medicine? They need to know if you have any of these conditions: -diabetes -immune system problems -infection -inflammatory bowel disease -liver disease -lung or breathing disease -lupus -nervous system problems like myasthenia gravis or Guillain-Barre syndrome -organ transplant -an unusual or allergic reaction to atezolizumab, other medicines, foods, dyes, or preservatives -pregnant or trying to get pregnant -breast-feeding How should I use this medicine? This medicine is for infusion into a vein. It is given by a health care professional in a hospital or clinic setting. A special MedGuide will be given to you before each treatment. Be sure to read this information carefully each time. Talk to your pediatrician regarding the use of this medicine in children. Special care may be needed. Overdosage: If you think you have taken too much of this medicine contact a poison control center or emergency room at once. NOTE: This medicine is only for you. Do not share this medicine with others. What if I miss a dose? It is important not to miss your dose. Call your doctor or health care professional if you are unable to keep an appointment. What may interact with this medicine? Interactions have not been studied. This list may not describe all possible interactions. Give your health care provider a list of all the medicines, herbs, non-prescription drugs, or dietary supplements you use. Also tell them if you smoke, drink alcohol, or use illegal drugs. Some items  may interact with your medicine. What should I watch for while using this medicine? Your condition will be monitored carefully while you are receiving this medicine. You may need blood work done while you are taking this medicine. Do not become pregnant while taking this medicine or for at least 5 months after stopping it. Women should inform their doctor if they wish to become pregnant or think they might be pregnant. There is a potential for serious side effects to an unborn child. Talk to your health care professional or pharmacist for more information. Do not breast-feed an infant while taking this medicine or for at least 5 months after the last dose. What side effects may I notice from receiving this medicine? Side effects that you should report to your doctor or health care professional as soon as possible: -allergic reactions like skin rash, itching or hives, swelling of the face, lips, or tongue -black, tarry stools -bloody or watery diarrhea -breathing problems -changes in vision -chest pain or chest tightness -chills -facial flushing -fever -headache -signs and symptoms of high blood sugar such as dizziness; dry mouth; dry skin; fruity breath; nausea; stomach pain; increased hunger or thirst; increased urination -signs and symptoms of liver injury like dark yellow or brown urine; general ill feeling or flu-like symptoms; light-colored stools; loss of appetite; nausea; right upper belly pain; unusually weak or tired; yellowing of the eyes or skin -stomach pain -trouble passing urine or change in the amount of urine Side effects that usually do not require medical attention (report to your doctor or health care professional if they continue or are bothersome): -cough -diarrhea -joint pain -muscle pain -muscle weakness -tiredness -weight loss   This list may not describe all possible side effects. Call your doctor for medical advice about side effects. You may report side effects  to FDA at 1-800-FDA-1088. Where should I keep my medicine? This drug is given in a hospital or clinic and will not be stored at home. NOTE: This sheet is a summary. It may not cover all possible information. If you have questions about this medicine, talk to your doctor, pharmacist, or health care provider.  2019 Elsevier/Gold Standard (2017-08-14 09:33:38)  

## 2018-06-23 NOTE — Progress Notes (Signed)
Hematology and Oncology Follow Up Visit  Sophia [name] [id number] 1990-08-27 28 y.o. 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 (+) - ER-/PR-/HER2+  --  Vertebral mets on 05/17/2018  Iron deficiency anemia   Past Therapy: Taxotere/Carboplatin/Herceptin/Perjeta -s/p cycle 4 S/P Bilateral mastectomies - 10/09/2017 Ovarian ablation with Lupron - stopped 11/06/2017 S/p TAH-BSO on 01/19/2018  Current Therapy:   Tecentriq/Avastin -- q 3 wk cycles -- start 06/23/2018 Lynparza (Olapirib)   300 mg po BID -- start 06/23/2018 Kadcyla - adjuvant therapy to started 11/06/2017, s/p cycle #8 Radiation Therapy - PATIENT HAS DECLINED RADIATION THERAPY IV Iron as indicated Zometa 4 mg IV q 3 month -- next dose 08/2018   Interim History: Sophia [name] is here today for follow-up.  It is been incredibly busy for Sophia [name].  She has been to [facility].  She has had biopsies done there.  The biopsies were done of her spinal lesions.  She has had kyphoplasty.  She has better pain control.  I do think that she would benefit from fentanyl patch.  I think she needs something that is long-acting.  I talked to her about the fentanyl patch.  She came in with her mom.  She is agreeable to the fentanyl patch.  We will start at 25 mcg to the skin every 3 days.  I got the pathology report back from [facility].  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  The markers however show that the tumor was TRIPLE NEGATIVE.   She had a biopsy done on 06/24/2018.  This was not done of the right subpectoral mass.  I am sure that this will also show breast cancer.  However, we will be able to send this study off for molecular profiling.  We were able to get approval for Tecentriq and Avastin for her.  This is being provided by the pharmaceutical company.  I am very indebted for them.  We also were able to get [place]  approved.  She was not going to take this because she wanted to have the results of her chest wall biopsy.  I told her that she really needs to start the [place] as she is BRCA positive.  I am holding her Avastin today given her biopsy that she is to have.  We are going to see about the possibility of radiation therapy.  This I think will be helpful for her pain.  I know that this is an incredibly tough situation for her.  Given that she is so young.  It is apparent that she has very aggressive disease.  It will be very interesting to see if she has her tumor to be HER-2 positive.  The FDA recently approved the new anti-HER-2 agent- Enhertu (fam-trastuzumab deruxtecan).  The one problem with Enhertu is the pulmonary issues.  She does seem to be in a better frame of mind.  She needs Zometa.  However, she has refused this for her bones.  I am not sure why she has refused this.  Of note, her CA 27.29 is only 17.  I think this goes along with the fact that her tumor is poorly differentiated.  Currently, her performance status is ECOG 1.   Medications:  Allergies as of 06/23/2018   No Known Allergies     Medication List       Accurate as of June 23, 2018 11:42 AM. Always use your most recent  med list.        ado-trastuzumab emtansine 100 MG Solr Commonly known as:  KADCYLA Inject 15 mLs (300 mg total) into the vein every 21 ( twenty-one) days.   meloxicam 15 MG tablet Commonly known as:  MOBIC Take 1 tablet (15 mg total) by mouth daily.   olaparib 150 MG tablet Commonly known as:  LYNPARZA Take 2 tablets (300 mg total) by mouth 2 (two) times daily. Swallow whole.       Allergies: No Known Allergies  Past Medical History, Surgical history, Social history, and Family History were reviewed and updated.  Review of Systems: Review of Systems  Constitutional: Negative.   HENT: Negative.   Eyes: Negative.   Respiratory: Negative.   Cardiovascular: Negative.     Gastrointestinal: Negative.   Genitourinary: Negative.   Musculoskeletal: Positive for back pain.  Skin: Negative.   Neurological: Negative.   Endo/Heme/Allergies: Negative.   Psychiatric/Behavioral: Negative.      Physical Exam:  weight is 183 lb (83 kg). Her oral temperature is 98 F (36.7 C). Her blood pressure is 108/65 and her pulse is 82. Her respiration is 18 and oxygen saturation is 98%.   Wt Readings from Last 3 Encounters:  06/23/18 183 lb (83 kg)  06/11/18 183 lb (83 kg)  05/27/18 187 lb 8 oz (85 kg)    Physical Exam Vitals signs reviewed.  Constitutional:      Comments: Breast exam shows bilateral mastectomies with implants.  These are well-healed.  There is no erythema.  There is no warmth.  There is no palpable axillary lymph nodes.  She does have some fullness in the right axilla.  HENT:     Head: Normocephalic and atraumatic.  Eyes:     Pupils: Pupils are equal, round, and reactive to light.  Neck:     Musculoskeletal: Normal range of motion.     Comments: She has some fullness in the right cervical and supraclavicular region.  This probably is adenopathy. Cardiovascular:     Rate and Rhythm: Normal rate and regular rhythm.     Heart sounds: Normal heart sounds.  Pulmonary:     Effort: Pulmonary effort is normal.     Breath sounds: Normal breath sounds.  Abdominal:     General: Bowel sounds are normal.     Palpations: Abdomen is soft.  Musculoskeletal: Normal range of motion.        General: No tenderness or deformity.     Comments: Back exam shows no tenderness over the spine.  There may be some slight spasms in the right lumbar paraspinal muscles.  There is some tenderness along the right flank.  Lymphadenopathy:     Cervical: No cervical adenopathy.  Skin:    General: Skin is warm and dry.     Findings: No erythema or rash.  Neurological:     Mental Status: She is alert and oriented to person, place, and time.  Psychiatric:        Behavior:  Behavior normal.        Thought Content: Thought content normal.        Judgment: Judgment normal.      Lab Results  Component Value Date   WBC 5.0 06/23/2018   HGB 11.6 (L) 06/23/2018   HCT 36.0 06/23/2018   MCV 87.2 06/23/2018   PLT 148 (L) 06/23/2018   Lab Results  Component Value Date   FERRITIN 567 (H) 05/27/2018   IRON 79 05/27/2018   TIBC 253  05/27/2018   UIBC 174 05/27/2018   IRONPCTSAT 31 05/27/2018   Lab Results  Component Value Date   RBC 4.13 06/23/2018   No results found for: KPAFRELGTCHN, LAMBDASER, KAPLAMBRATIO No results found for: IGGSERUM, IGA, IGMSERUM No results found for: Odetta Pink, SPEI   Chemistry      Component Value Date/Time   NA 136 06/23/2018 1050   K 3.7 06/23/2018 1050   CL 101 06/23/2018 1050   CO2 26 06/23/2018 1050   BUN 15 06/23/2018 1050   CREATININE 0.68 06/23/2018 1050      Component Value Date/Time   CALCIUM 10.3 06/23/2018 1050   ALKPHOS 60 06/23/2018 1050   AST 47 (H) 06/23/2018 1050   ALT 22 06/23/2018 1050   BILITOT 0.5 06/23/2018 1050       Impression and Plan: Sophia [name] is a very pleasant 28 yo caucasian female with locally advanced stage III ductal carcinoma of the right breast, HER-2+ and ER negative. She is BRCA positive.   We have a whole lot of work to do with Sophia [name].  Hopefully, the pathology will come back soon.  This will be incredibly important, particularly when we go to run the molecular profiling.  I really feel that we can be aggressive with Sophia [name].  She is young.  She is in very good shape.  I would like to hope that given the fact that her tumor is BRCA (+) that we have options.  And again, if her tumor is HER-2 positive this also might give [place] some options.  We will continue to have close follow-up with Sophia [name].  He probably will take a good 2 or 3 weeks before we have additional information that will be useful for [place].  I  spent about an hour with Sophia [name] and her mom.  All the time spent face-to-face.  Again, this is an incredibly complicated situation given Sophia [name] very young age.  I will plan to see her back in 3 weeks.  We will treat her with Tecentriq and Avastin at that time.  She will still be on [place]  She has this recurrence in the right chest wall and right supraclavicular area and right axilla so we should hopefully start seeing shrinkage that could indicate a response.    Again, we will have to await the pathology report from [facility].  I will see about getting some additional biopsies here.  She is going to need some radiation to the lumbar spine area.  I will speak with Dr. Sondra Come about this.  I spent about an hour and a half with the she and her mom today.  All the time spent face-to-face.  I reviewed all of her labs.  I reviewed her x-rays.  I help coordinate future care for her.   Volanda Napoleon, MD 1/29/202011:42 AM

## 2018-06-23 NOTE — Progress Notes (Signed)
Pt. States that she had zometa "once before" and it made her feel "very, very sick for days", therefore she refuses to get it again. Dr. Marin Olp aware and said "it's ok".

## 2018-06-24 ENCOUNTER — Ambulatory Visit (HOSPITAL_COMMUNITY)
Admission: RE | Admit: 2018-06-24 | Discharge: 2018-06-24 | Disposition: A | Discharge status: Home / Self Care | Payer: PRIVATE HEALTH INSURANCE | Source: Ambulatory Visit | Attending: Hematology & Oncology | Admitting: Hematology & Oncology

## 2018-06-24 ENCOUNTER — Other Ambulatory Visit: Payer: Self-pay | Admitting: Hematology & Oncology

## 2018-06-24 ENCOUNTER — Other Ambulatory Visit: Payer: Self-pay

## 2018-06-24 DIAGNOSIS — C7951 Secondary malignant neoplasm of bone: Secondary | ICD-10-CM | POA: Diagnosis not present

## 2018-06-24 DIAGNOSIS — C50919 Malignant neoplasm of unspecified site of unspecified female breast: Secondary | ICD-10-CM | POA: Insufficient documentation

## 2018-06-24 DIAGNOSIS — C773 Secondary and unspecified malignant neoplasm of axilla and upper limb lymph nodes: Secondary | ICD-10-CM | POA: Diagnosis not present

## 2018-06-24 LAB — CBC WITH DIFFERENTIAL/PLATELET
Abs Immature Granulocytes: 0.01 10*3/uL (ref 0.00–0.07)
Basophils Absolute: 0 10*3/uL (ref 0.0–0.1)
Basophils Relative: 0 %
Eosinophils Absolute: 0.3 10*3/uL (ref 0.0–0.5)
Eosinophils Relative: 5 %
HCT: 39.2 % (ref 36.0–46.0)
Hemoglobin: 12.6 g/dL (ref 12.0–15.0)
Immature Granulocytes: 0 %
Lymphocytes Relative: 30 %
Lymphs Abs: 1.5 10*3/uL (ref 0.7–4.0)
MCH: 28.3 pg (ref 26.0–34.0)
MCHC: 32.1 g/dL (ref 30.0–36.0)
MCV: 87.9 fL (ref 80.0–100.0)
Monocytes Absolute: 0.4 10*3/uL (ref 0.1–1.0)
Monocytes Relative: 9 %
NEUTROS PCT: 56 %
Neutro Abs: 2.8 10*3/uL (ref 1.7–7.7)
PLATELETS: 159 10*3/uL (ref 150–400)
RBC: 4.46 MIL/uL (ref 3.87–5.11)
RDW: 14.2 % (ref 11.5–15.5)
WBC: 5 10*3/uL (ref 4.0–10.5)
nRBC: 0 % (ref 0.0–0.2)

## 2018-06-24 LAB — PROTIME-INR
INR: 1.01
Prothrombin Time: 13.2 seconds (ref 11.4–15.2)

## 2018-06-24 MED ORDER — FENTANYL CITRATE (PF) 100 MCG/2ML IJ SOLN
INTRAMUSCULAR | Status: AC
  Filled 2018-06-24: qty 2

## 2018-06-24 MED ORDER — FENTANYL CITRATE (PF) 100 MCG/2ML IJ SOLN
INTRAMUSCULAR | Status: AC | PRN
  Administered 2018-06-24 (×2): 50 ug via INTRAVENOUS

## 2018-06-24 MED ORDER — LIDOCAINE HCL (PF) 1 % IJ SOLN
INTRAMUSCULAR | Status: AC
  Filled 2018-06-24: qty 30

## 2018-06-24 MED ORDER — MIDAZOLAM HCL 2 MG/2ML IJ SOLN
INTRAMUSCULAR | Status: AC | PRN
  Administered 2018-06-24 (×4): 1 mg via INTRAVENOUS

## 2018-06-24 MED ORDER — MIDAZOLAM HCL 2 MG/2ML IJ SOLN
INTRAMUSCULAR | Status: AC
  Filled 2018-06-24: qty 4

## 2018-06-24 MED ORDER — SODIUM CHLORIDE 0.9 % IV SOLN: INTRAVENOUS | Status: DC

## 2018-06-24 NOTE — Procedures (Signed)
Interventional Radiology Procedure Note  Procedure: US guided core biopsy of right subpectoral lymph node/mass  Complications: None  Estimated Blood Loss: < 10 mL  Findings: 2.9 cm right subpectoral mass sampled with 16 G core device x 5.  Venetia Night. Kathlene Cote, M.D Pager:  [phone]

## 2018-06-24 NOTE — Discharge Instructions (Addendum)
Needle Biopsy, Care After °This sheet gives you information about how to care for yourself after your procedure. Your health care provider may also give you more specific instructions. If you have problems or questions, contact your health care provider. °What can I expect after the procedure? °After the procedure, it is common to have soreness, bruising, or mild pain at the puncture site. This should go away in a few days. °Follow these instructions at home: °Needle insertion site care ° °· Wash your hands with soap and water before you change your bandage (dressing). If you cannot use soap and water, use hand sanitizer. °· Follow instructions from your health care provider about how to take care of your puncture site. This includes: °? When and how to change your dressing. °? When to remove your dressing. °· Check your puncture site every day for signs of infection. Check for: °? Redness, swelling, or pain. °? Fluid or blood. °? Pus or a bad smell. °? Warmth. °General instructions °· Return to your normal activities as told by your health care provider. Ask your health care provider what activities are safe for you. °· Do not take baths, swim, or use a hot tub until your health care provider approves. Ask your health care provider if you may take showers. You may only be allowed to take sponge baths. °· Take over-the-counter and prescription medicines only as told by your health care provider. °· Keep all follow-up visits as told by your health care provider. This is important. °Contact a health care provider if: °· You have a fever. °· You have redness, swelling, or pain at the puncture site that lasts longer than a few days. °· You have fluid, blood, or pus coming from your puncture site. °· Your puncture site feels warm to the touch. °Get help right away if: °· You have severe bleeding from the puncture site. °Summary °· After the procedure, it is common to have soreness, bruising, or mild pain at the puncture  site. This should go away in a few days. °· Check your puncture site every day for signs of infection, such as redness, swelling, or pain. °· Get help right away if you have severe bleeding from your puncture site. °This information is not intended to replace advice given to you by your health care provider. Make sure you discuss any questions you have with your health care provider. °Document Released: 09/26/2014 Document Revised: 05/25/2017 Document Reviewed: 05/25/2017 °Elsevier Interactive Patient Education © 2019 Elsevier Inc[employer] °Moderate Conscious Sedation, Adult, Care After °These instructions provide you with information about caring for yourself after your procedure. Your health care provider may also give you more specific instructions. Your treatment has been planned according to current medical practices, but problems sometimes occur. Call your health care provider if you have any problems or questions after your procedure. °What can I expect after the procedure? °After your procedure, it is common: °· To feel sleepy for several hours. °· To feel clumsy and have poor balance for several hours. °· To have poor judgment for several hours. °· To vomit if you eat too soon. °Follow these instructions at home: °For at least 24 hours after the procedure: ° °· Do not: °? Participate in activities where you could fall or become injured. °? Drive. °? Use heavy machinery. °? Drink alcohol. °? Take sleeping pills or medicines that cause drowsiness. °? Make important decisions or sign legal documents. °? Take care of children on your own. °· Rest. °Eating   and drinking °· Follow the diet recommended by your health care provider. °· If you vomit: °? Drink water, juice, or soup when you can drink without vomiting. °? Make sure you have little or no nausea before eating solid foods. °General instructions °· Have a responsible adult stay with you until you are awake and alert. °· Take over-the-counter and prescription  medicines only as told by your health care provider. °· If you smoke, do not smoke without supervision. °· Keep all follow-up visits as told by your health care provider. This is important. °Contact a health care provider if: °· You keep feeling nauseous or you keep vomiting. °· You feel light-headed. °· You develop a rash. °· You have a fever. °Get help right away if: °· You have trouble breathing. °This information is not intended to replace advice given to you by your health care provider. Make sure you discuss any questions you have with your health care provider. °Document Released: 03/02/2013 Document Revised: 10/15/2015 Document Reviewed: 09/01/2015 °Elsevier Interactive Patient Education © 2019 Elsevier Inc. ° °

## 2018-06-24 NOTE — Sedation Documentation (Signed)
Patient is resting comfortably. 

## 2018-06-24 NOTE — H&P (Signed)
Referring Physician(s): Ennever,Peter R  Supervising Physician: Aletta Edouard  Patient Status:  [facility]  Chief Complaint: "I'm having a biopsy"   Subjective: Pt familiar to IR service from prior port a cath placement in 2019. She has a hx of right breast carcinoma, s/p bilat mastectomies with implants, chemo and now immunotherapy. Recent PET scan has revealed:  Extensive thoracic lymphadenopathy, including dominant right axillary and subpectoral nodal metastases. Multifocal osseous metastases, including the thoracolumbar spine, sacrum, and left pelvis.  She presents today for image guided biopsy of right axillary vs subpectoral vs neck lymph node/ST mass for further evaluation. She denies fever,HA, substernal CP, dyspnea, cough, abd pain, vomiting or bleeding. She does have rt shoulder/right upper chest soreness/fullness, back pain and occ nausea.   Past Medical History:  Diagnosis Date  . BRCA1 positive   . Breast cancer metastasized to bone, unspecified laterality ([place]) 05/17/2018  . Depression    no meds  . Family history of breast cancer   . History of breast cancer    right  . History of chemotherapy    finished 08/21/2017  . Port-A-Cath in place    Past Surgical History:  Procedure Laterality Date  . BREAST RECONSTRUCTION WITH PLACEMENT OF TISSUE EXPANDER AND FLEX HD (ACELLULAR HYDRATED DERMIS) Bilateral 10/09/2017   Procedure: BILATERAL BREAST RECONSTRUCTION WITH PLACEMENT OF TISSUE EXPANDER AND FLEX HD (ACELLULAR HYDRATED DERMIS);  Surgeon: [name], MD;  Location: [place];  Service: Plastics;  Laterality: Bilateral;  . HYSTEROSCOPY W/D&C N/A 01/19/2018   Procedure: DILATATION & CURETTAGE/HYSTEROSCOPY;  Surgeon: Megan Salon, MD;  Location: [facility];  Service: Gynecology;  Laterality: N/A;  . IR FLUORO GUIDE PORT INSERTION RIGHT  06/18/2017  . IR US GUIDE VASC ACCESS RIGHT  06/18/2017  . LAPAROSCOPIC BILATERAL SALPINGO OOPHERECTOMY  Bilateral 01/19/2018   Procedure: LAPAROSCOPIC BILATERAL SALPINGO OOPHORECTOMY;  Surgeon: Megan Salon, MD;  Location: [facility];  Service: Gynecology;  Laterality: Bilateral;  . MASTECTOMY MODIFIED RADICAL Right 10/09/2017   Procedure: MASTECTOMY MODIFIED RADICAL;  Surgeon: Fanny Skates, MD;  Location: [place];  Service: General;  Laterality: Right;  . REMOVAL OF BILATERAL TISSUE EXPANDERS WITH PLACEMENT OF BILATERAL BREAST IMPLANTS Bilateral 03/30/2018   Procedure: REMOVAL OF BILATERAL TISSUE EXPANDERS WITH PLACEMENT OF BILATERAL BREAST IMPLANTS;  Surgeon: [name], MD;  Location: [place];  Service: Plastics;  Laterality: Bilateral;  . TOTAL MASTECTOMY Left 10/09/2017   Procedure: LEFT PROPHYLATIC MASTECTOMY;  Surgeon: Fanny Skates, MD;  Location: [place];  Service: General;  Laterality: Left;      Allergies: Patient has no known allergies.  Medications: Prior to Admission medications   Medication Sig Start Date End Date Taking? Authorizing Provider  meloxicam (MOBIC) 15 MG tablet Take 1 tablet (15 mg total) by mouth daily. 05/17/18  Yes Volanda Napoleon, MD  oxyCODONE 10 MG TABS Take 0.5 tablets (5 mg total) by mouth every 4 (four) hours as needed for severe pain. 06/23/18  Yes Volanda Napoleon, MD  senna (SENOKOT) 8.6 MG TABS tablet Take 1 tablet by mouth daily as needed for mild constipation.   Yes [provider]  ado-trastuzumab emtansine (KADCYLA) 100 MG SOLR Inject 15 mLs (300 mg total) into the vein every 21 ( twenty-one) days. Patient not taking: Reported on 06/23/2018 11/02/17   Volanda Napoleon, MD  fentaNYL (DURAGESIC) 25 MCG/HR Place 1 patch onto the skin every 3 (three) days. 06/23/18   Volanda Napoleon,  MD  olaparib (LYNPARZA) 150 MG tablet Take 2 tablets (300 mg total) by mouth 2 (two) times daily. Swallow whole. 06/07/18   Volanda Napoleon, MD     Vital Signs: BP 111/74   Pulse 71   Temp 97.9 F  (36.6 C) (Oral)   Resp 18   Ht _0  (1.753 m)   Wt 183 lb (83 kg)   SpO2 98%   BMI 27.02 kg/m   Physical Exam awake/alert; chest- CTA bilat; clean, intact rt chest port a cath; heart- RRR; abd- soft,+BS,NT; no LE edema; fullness rt upper chest/supraclavicular region with mild tenderness  Imaging: No results found.  Labs:  CBC: Recent Labs    05/06/18 0845 05/27/18 0845 06/11/18 1111 06/23/18 1050  WBC 7.0 6.5 5.6 5.0  HGB 11.7* 12.8 11.8* 11.6*  HCT 37.0 39.4 37.1 36.0  PLT 161 162 178 148*    COAGS: No results for input(s): INR, APTT in the last 8760 hours.  BMP: Recent Labs    05/06/18 0845 05/27/18 0845 06/11/18 1111 06/23/18 1050  NA 135 135 138 136  K 3.7 3.3* 3.6 3.7  CL 99 99 102 101  CO2 _1 GLUCOSE 108* 93 98 110*  BUN _2 CALCIUM 9.9 9.8 10.1 10.3  CREATININE 0.78 0.88 0.69 0.68  GFRNONAA >60 >60 >60 >60  GFRAA >60 >60 >60 >60    LIVER FUNCTION TESTS: Recent Labs    05/06/18 0845 05/27/18 0845 06/11/18 1111 06/23/18 1050  BILITOT 0.4 0.4 0.4 0.5  AST 28 33 40 47*  ALT _3 ALKPHOS 72 66 68 60  PROT 7.8 8.5* 8.0 8.0  ALBUMIN 4.1 4.2 4.3 4.4    Assessment and Plan: Pt with hx of right breast carcinoma, s/p bilat mastectomies with implants, chemo and now immunotherapy. Recent PET scan has revealed:  Extensive thoracic lymphadenopathy, including dominant right axillary and subpectoral nodal metastases. Multifocal osseous metastases, including the thoracolumbar spine, sacrum, and left pelvis.  She presents today for image guided biopsy of right axillary vs subpectoral vs neck lymph node/ST mass for further evaluation.Risks and benefits discussed with the patient/mother including, but not limited to bleeding, infection, damage to adjacent structures or low yield requiring additional tests.  All of the patient's questions were answered, patient is agreeable to proceed. Consent signed and in chart.  LABS  PENDING   Electronically Signed: D. Rowe Robert, PA-C 06/24/2018, 10:04 AM   I spent a total of 25 minutes  at the the patient's bedside AND on the patient's hospital floor or unit, greater than 50% of which was counseling/coordinating care for image guided right axillary vs subpectoral vs neck lymph node biopsy

## 2018-06-25 ENCOUNTER — Telehealth: Payer: Self-pay | Admitting: Hematology & Oncology

## 2018-06-25 NOTE — Telephone Encounter (Signed)
Tried to contact patient to confirm 07/19/18 appts at 12 pm per 1/29 sch msg. Patient phone kept hanging up to message across. Mailed appt letter.

## 2018-06-29 ENCOUNTER — Ambulatory Visit (INDEPENDENT_AMBULATORY_CARE_PROVIDER_SITE_OTHER): Payer: PRIVATE HEALTH INSURANCE | Admitting: Psychiatry

## 2018-06-29 ENCOUNTER — Ambulatory Visit (HOSPITAL_COMMUNITY): Payer: Medicaid Other

## 2018-06-29 ENCOUNTER — Telehealth: Payer: Self-pay | Admitting: *Deleted

## 2018-06-29 DIAGNOSIS — F4323 Adjustment disorder with mixed anxiety and depressed mood: Secondary | ICD-10-CM

## 2018-06-29 NOTE — Telephone Encounter (Addendum)
Message left  ----- Message from Volanda Napoleon, MD sent at 06/28/2018 12:10 PM EST ----- Call - the biopsy shows breast cancer.  pete

## 2018-07-01 ENCOUNTER — Encounter: Payer: Self-pay | Admitting: Hematology & Oncology

## 2018-07-05 ENCOUNTER — Encounter: Payer: Self-pay | Admitting: Hematology & Oncology

## 2018-07-05 ENCOUNTER — Other Ambulatory Visit: Payer: Self-pay | Admitting: *Deleted

## 2018-07-05 MED ORDER — GABAPENTIN 300 MG PO CAPS: 300.0000 mg | ORAL_CAPSULE | Freq: Four times a day (QID) | ORAL | 2 refills | Status: DC

## 2018-07-06 ENCOUNTER — Other Ambulatory Visit: Payer: Self-pay | Admitting: *Deleted

## 2018-07-06 MED ORDER — METOCLOPRAMIDE HCL 10 MG PO TABS: 10.0000 mg | ORAL_TABLET | Freq: Three times a day (TID) | ORAL | 3 refills | Status: DC

## 2018-07-07 ENCOUNTER — Encounter: Payer: Self-pay | Admitting: Hematology & Oncology

## 2018-07-08 ENCOUNTER — Ambulatory Visit: Payer: Medicaid Other | Admitting: Hematology & Oncology

## 2018-07-08 ENCOUNTER — Other Ambulatory Visit: Payer: Medicaid Other

## 2018-07-08 ENCOUNTER — Ambulatory Visit
Admission: RE | Admit: 2018-07-08 | Discharge: 2018-07-08 | Disposition: A | Discharge status: Home / Self Care | Payer: PRIVATE HEALTH INSURANCE | Source: Ambulatory Visit | Attending: Radiation Oncology | Admitting: Radiation Oncology

## 2018-07-08 ENCOUNTER — Encounter: Payer: Self-pay | Admitting: Radiation Oncology

## 2018-07-08 ENCOUNTER — Other Ambulatory Visit: Payer: Self-pay

## 2018-07-08 ENCOUNTER — Ambulatory Visit: Payer: Medicaid Other

## 2018-07-08 VITALS — BP 109/78 | HR 97 | Temp 98.4°F | Resp 18 | Ht 69.0 in | Wt 174.8 lb

## 2018-07-08 DIAGNOSIS — C7951 Secondary malignant neoplasm of bone: Secondary | ICD-10-CM | POA: Diagnosis present

## 2018-07-08 DIAGNOSIS — Z171 Estrogen receptor negative status [ER-]: Secondary | ICD-10-CM

## 2018-07-08 DIAGNOSIS — C50919 Malignant neoplasm of unspecified site of unspecified female breast: Secondary | ICD-10-CM | POA: Diagnosis not present

## 2018-07-08 DIAGNOSIS — C50111 Malignant neoplasm of central portion of right female breast: Secondary | ICD-10-CM

## 2018-07-08 DIAGNOSIS — C7952 Secondary malignant neoplasm of bone marrow: Secondary | ICD-10-CM

## 2018-07-08 HISTORY — DX: Malignant neoplasm of unspecified site of unspecified female breast: C50.919

## 2018-07-08 NOTE — Progress Notes (Signed)
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  Extensive thoracic lymphadenopathy, including dominant right axillary and subpectoral nodal metastases, as above.  Multifocal osseous metastases, including the thoracolumbar spine, sacrum, and left pelvis, as above.  Location(s) of Symptomatic Metastases: MRI Lumbar Spine 05/17/18:  IMPRESSION: 1. Abnormal bone lesions in the T11, T12, L3 and to a lesser extent L5 vertebral bodies most consistent with metastatic disease given the patient's history. Pathologic compression fracture of the T11 vertebral body with approximately 50% height loss.  Past/Anticipated chemotherapy by medical oncology, if any: Per Dr. Marin Olp 06/11/18: Past Therapy: Taxotere/Carboplatin/Herceptin/Perjeta -s/p cycle 4 S/P Bilateral mastectomies - 10/09/2017 Ovarian ablation with Lupron - stopped 11/06/2017 S/p TAH-BSO on 01/19/2018  Current Therapy:        Kadcyla - adjuvant therapy to started 11/06/2017, s/p cycle#8 Radiation Therapy - PATIENT HAS DECLINED RADIATION THERAPY IV Iron as indicated Zometa 4 mg IV q 3 month -- next dose 08/2018  Pain on a scale of 0-10 is: Pt reports pain on RIGHT side of upper body, from neck to lower back, including upper right arm. Pt rates this pain 4/10 as she has taken Gabapentin prior to appt and states it has helped relieve pain intensity.    If Spine Met(s), symptoms, if any, include:  Bowel/Bladder retention or incontinence (please describe): Pt reports constipation. Pt reports dark urine and attributes this to dehydration and poor PO intake,  Numbness or weakness in extremities (please describe):  numbness and tingling in RIGHT hand fingers.   Current Decadron regimen, if applicable: No  Ambulatory status? Walker? Wheelchair?: Ambulatory  SAFETY ISSUES:  Prior radiation? No  Pacemaker/ICD? No  Possible current pregnancy? No, ovaries have been ablated  Is the patient on methotrexate? No  Current Complaints / other details:  Pt presents today for initial consult with Dr. Sondra Come for Radiation Oncology. Pt is accompanied by mother. SW consult ordered for a score of 5.5 on distress screen.   BP 109/78 (BP Location: Left Arm, Patient Position: Sitting)   Pulse 97   Temp 98.4 F (36.9 C) (Oral)   Resp 18   Ht '5\' 9"'$  (1.753 m)   Wt 174 lb 12.8 oz (79.3 kg)   SpO2 100%   BMI 25.81 kg/m   Wt Readings from Last 3 Encounters:  07/08/18 174 lb 12.8 oz (79.3 kg)  06/24/18 183 lb (83 kg)  06/23/18 183 lb (83 kg)   Loma Sousa, RN BSN

## 2018-07-08 NOTE — Progress Notes (Signed)
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 female who is accompanied by her mother. The patient presented with low back pain. She has a history of Stage IIIA (T3N2M0)Right Breast Invasive Ductal Carcinoma, ER-/PR-/HER2+, s/p bilateral mastectomy. She is also BRCA+. She is currently under the care of Dr. Marin Olp for immunotherapy [facility]). Of note, she is also s/p BSO. She recently started Tecentriq/Avastin -- q 3 wk cycles -- start 06/23/2018 Lonie [place] (Olapirib)   300 mg po BID -- start 06/23/2018   She underwent lumbar spine MRI on 05/17/2018 for he lower back pain, with results showing abnormal bone lesions in the T11 (with pathologic fracture) T12, L3, and to a lesser extent L5 vertebral bodies. She was referred to Dr. Rip Harbour at [facility] on 05/21/2018 for additional treatment. She underwent biopsies of T11 and T12, both demonstrating metastatic poorly-differentiated adenocarcinoma, compatible with metastatic carcinoma of breast origin, triple negative.   She underwent CT chest/abdomen/pelvis and PET scan on 05/31/2018. CT scans showed extensive thoracic nodal metastasis, suspected multifocal lytic/sclerotic osseous metastases, and associated pathologic fracture at T11. PET scan confirmed extensive thoracic lymphadenopathy, including dominant right axillary and subpectoral nodal metastases, and multifocal osseous metastases, including the thoracolumbar spine, sacrum, and left pelvis. Subsequently, she underwent  biopsy on 06/24/2018 through [facility] of a right subpectoral lymph node, which also showed metastatic poorly differentiated adenocarcinoma, as well as no lymphoid tissue.  She has kindly been referred to [place] for consideration of palliative radiation treatment to the thoracolumbar spine and right axilla.   PREVIOUS RADIATION THERAPY: No  PAST MEDICAL HISTORY:  has a past medical history of BRCA1 positive, Breast cancer ([place]) (05/2017), Breast cancer metastasized to bone, unspecified laterality ([place]) (05/17/2018), Depression, Family history of breast cancer, History of breast cancer, History of chemotherapy, and Port-A-Cath in place.    PAST SURGICAL HISTORY: Past Surgical History:  Procedure Laterality Date  . BREAST RECONSTRUCTION WITH PLACEMENT OF TISSUE EXPANDER AND FLEX HD (ACELLULAR HYDRATED DERMIS) Bilateral 10/09/2017   Procedure: BILATERAL BREAST RECONSTRUCTION WITH PLACEMENT OF TISSUE EXPANDER AND FLEX HD (ACELLULAR HYDRATED DERMIS);  Surgeon: [name], MD;  Location: [facility];  Service: Plastics;  Laterality: Bilateral;  . HYSTEROSCOPY W/D&C N/A 01/19/2018   Procedure: DILATATION & CURETTAGE/HYSTEROSCOPY;  Surgeon: Megan Salon, MD;  Location: [facility];  Service: Gynecology;  Laterality: N/A;  . IR FLUORO GUIDE PORT INSERTION RIGHT  06/18/2017  . IR US GUIDE VASC ACCESS RIGHT  06/18/2017  . LAPAROSCOPIC BILATERAL SALPINGO OOPHERECTOMY Bilateral 01/19/2018   Procedure: LAPAROSCOPIC BILATERAL SALPINGO OOPHORECTOMY;  Surgeon: Megan Salon, MD;  Location: [facility];  Service: Gynecology;  Laterality: Bilateral;  . MASTECTOMY MODIFIED RADICAL Right 10/09/2017   Procedure: MASTECTOMY MODIFIED RADICAL;  Surgeon: Fanny Skates, MD;  Location: [facility];  Service: General;  Laterality: Right;  . REMOVAL OF BILATERAL TISSUE EXPANDERS WITH PLACEMENT OF BILATERAL BREAST IMPLANTS Bilateral 03/30/2018   Procedure: REMOVAL OF BILATERAL TISSUE EXPANDERS WITH PLACEMENT OF  BILATERAL BREAST IMPLANTS;  Surgeon: [name], MD;  Location: [facility];  Service: Plastics;  Laterality: Bilateral;  . TOTAL MASTECTOMY Left 10/09/2017   Procedure: LEFT PROPHYLATIC MASTECTOMY;  Surgeon: Fanny Skates, MD;  Location: [facility];  Service: General;  Laterality: Left;    FAMILY HISTORY: family history includes Breast cancer in an other family member; Breast cancer (age of onset: 25) in her maternal grandmother.  SOCIAL HISTORY:  reports that she has never smoked. She has never used smokeless tobacco. She reports current alcohol use. She reports that she does not use drugs.  ALLERGIES: Patient has no known allergies.  MEDICATIONS:  Current Outpatient Medications  Medication Sig Dispense Refill  . fentaNYL (DURAGESIC) 25 MCG/HR Place 1 patch onto the skin every 3 (three) days. 10 patch 0  . gabapentin (NEURONTIN) 300 MG capsule Take 1 capsule (300 mg total) by mouth 4 (four) times daily. 120 capsule 2  . metoCLOPramide (REGLAN) 10 MG tablet Take 1 tablet (10 mg total) by mouth 3 (three) times daily before meals. 90 tablet 3  . olaparib (LYNPARZA) 150 MG tablet Take 2 tablets (300 mg total) by mouth 2 (two) times daily. Swallow whole. 120 tablet 2  . oxyCODONE 10 MG TABS Take 0.5 tablets (5 mg total) by mouth every 4 (four) hours as needed for severe pain. 90 tablet 0  . ado-trastuzumab emtansine (KADCYLA) 100 MG SOLR Inject 15 mLs (300 mg total) into the vein every 21 ( twenty-one) days. (Patient not taking: Reported on 06/23/2018) 250 mL 6  . meloxicam (MOBIC) 15 MG tablet Take 1 tablet (15 mg total) by mouth daily. (Patient not taking: Reported on 07/08/2018) 30 tablet 3  . senna (SENOKOT) 8.6 MG TABS tablet Take 1 tablet by mouth daily as needed for mild constipation.     No current facility-administered medications for this encounter.    Facility-Administered Medications Ordered in Other Encounters  Medication Dose Route Frequency  Provider Last Rate Last Dose  . sodium chloride flush (NS) 0.9 % injection 10 mL  10 mL Intravenous PRN Volanda Napoleon, MD   10 mL at 06/11/18 1515    REVIEW OF SYSTEMS:  A 10+ POINT REVIEW OF SYSTEMS WAS OBTAINED including neurology, dermatology, psychiatry, cardiac, respiratory, lymph, extremities, GI, GU, musculoskeletal, constitutional, reproductive, HEENT. She reports headaches to the occipital region of her head and neck. She reports pain to the right side of upper body, from neck to lower back and including upper right arm. She states taking Gabapentin, which has provided relief to intensity. She reports chills, constipation, and numbness and tingling in fingers of right hand from the immunotherapy. She states some of these may be due to decrease in water and food intake.   PHYSICAL EXAM:  height is _0  (1.753 m) and weight is 174 lb 12.8 oz (79.3 kg). Her oral temperature is 98.4 F (36.9 C). Her blood pressure is 109/78 and her pulse is 97. Her respiration is 18 and oxygen saturation is 100%.   General: Alert and oriented, in no acute distress. HEENT: Head is normocephalic. Extraocular movements are intact. Oropharynx is clear, prominent tonsils. Neck: Neck is supple. 1.5 cm somewhat tender lymph node in the right anterior neck, approximately level 2. Heart: Regular in rate and rhythm with no murmurs, rubs, or gallops. Chest: Clear to auscultation bilaterally, with no rhonchi, wheezes, or rales. Abdomen: Soft, nontender, nondistended, with no rigidity or guarding. Lymphatics: see Neck Exam Musculoskeletal: symmetric strength and muscle tone throughout. She has some tenderness to the right lateral lower  ribcage but no palpable abnormalities. Neurologic: No obvious focalities. Speech is fluent. Coordination is intact. Psychiatric: Judgment and insight are intact. Affect is appropriate.   ECOG = 1  0 - Asymptomatic (Fully active, able to carry on all predisease activities without  restriction)  1 - Symptomatic but completely ambulatory (Restricted in physically strenuous activity but ambulatory and able to carry out work of a light or sedentary nature. For example, light housework, office work)  2 - Symptomatic, <50% in bed during the day (Ambulatory and capable of all self care but unable to carry out any work activities. Up and about more than 50% of waking hours)  3 - Symptomatic, >50% in bed, but not bedbound (Capable of only limited self-care, confined to bed or chair 50% or more of waking hours)  4 - Bedbound (Completely disabled. Cannot carry on any self-care. Totally confined to bed or chair)  5 - Death   Eustace Pen MM, Creech RH, Tormey DC, et al. [phone]). "Toxicity and response criteria of the [facility]". [place] Oncol. 5 (6): 649-55  LABORATORY DATA:  Lab Results  Component Value Date   WBC 5.0 06/24/2018   HGB 12.6 06/24/2018   HCT 39.2 06/24/2018   MCV 87.9 06/24/2018   PLT 159 06/24/2018   NEUTROABS 2.8 06/24/2018   Lab Results  Component Value Date   NA 136 06/23/2018   K 3.7 06/23/2018   CL 101 06/23/2018   CO2 26 06/23/2018   GLUCOSE 110 (H) 06/23/2018   CREATININE 0.68 06/23/2018   CALCIUM 10.3 06/23/2018      RADIOGRAPHY: Nm Hepato W/eject Fract  Result Date: 06/16/2018 CLINICAL DATA:  Right upper quadrant pain with nausea and vomiting EXAM: NUCLEAR MEDICINE HEPATOBILIARY IMAGING WITH GALLBLADDER EF VIEWS: Anterior right upper quadrant RADIOPHARMACEUTICALS:  5.2 mCi Tc-82m Choletec IV COMPARISON:  None. FINDINGS: Liver uptake of radiotracer is unremarkable. There is prompt visualization of gallbladder and small bowel, indicating patency of the cystic and common bile ducts. The patient consumed 8 ounces of Ensure Complete orally with calculation of the computer generated ejection fraction of radiotracer the gallbladder. The patient did not experience clinical symptoms with the oral Ensure consumption. The computer  generated ejection fraction of radiotracer from the gallbladder is normal at 80%, normal greater than 33% using the oral agent. IMPRESSION: Study within normal limits. Electronically Signed   By: WLowella GripIII M.D.   On: 06/16/2018 10:32   [place]Abdomen Limited  Result Date: 06/10/2018 CLINICAL DATA:  Upper abdominal pain.  Breast carcinoma EXAM: ULTRASOUND ABDOMEN LIMITED RIGHT UPPER QUADRANT COMPARISON:  CT abdomen and pelvis May 31, 2018 FINDINGS: Gallbladder: There is a calculus imbedded within the neck of the gallbladder measuring 6 mm. There is no gallbladder wall thickening or pericholecystic fluid. No sonographic Murphy sign noted by sonographer. Common bile duct: Diameter: 3 mm. No intrahepatic or extrahepatic biliary duct dilatation. Liver: No focal lesion identified. Within normal limits in parenchymal echogenicity. Portal vein is patent on color Doppler imaging with normal direction of blood flow towards the liver. IMPRESSION: 6 mm gallstone imbedded in the neck of the gallbladder. No gallbladder wall thickening or pericholecystic fluid. Study otherwise unremarkable. It may be prudent to consider nuclear medicine hepatobiliary imaging study to assess for complete obstruction of the cystic duct given this appearance. These results will be called to the ordering clinician or representative by the Radiologist Assistant, and communication documented in the PACS or zVision Dashboard. Electronically Signed   By:  Lowella Grip III M.D.   On: 06/10/2018 08:06   [place] Core Biopsy (lymph Nodes)  Result Date: 06/24/2018 INDICATION: History of right breast carcinoma with metastatic disease and tumor recurrence in the right chest wall and right subpectoral chest. Request has been made to perform biopsy in order to assess prognostic studies on current tissue to guide treatment. EXAM: ULTRASOUND GUIDED CORE BIOPSY OF RIGHT SUBPECTORAL MASS MEDICATIONS: None. ANESTHESIA/SEDATION: Fentanyl 100 mcg IV;  Versed 4.0 mg IV Moderate Sedation Time:  20 minutes. The patient was continuously monitored during the procedure by the interventional radiology nurse under my direct supervision. PROCEDURE: The procedure, risks, benefits, and alternatives were explained to the patient. Questions regarding the procedure were encouraged and answered. The patient understands and consents to the procedure. A time-out was performed prior to initiating the procedure. Ultrasound was performed to localize right-sided subpectoral masses. The right chest wall was prepped with chlorhexidine in a sterile fashion, and a sterile drape was applied covering the operative field. A sterile gown and sterile gloves were used for the procedure. Local anesthesia was provided with 1% Lidocaine. Under ultrasound guidance, a total of 5 separate 16 gauge core biopsy samples were obtained of a right subpectoral mass. Three samples were submitted in formalin and two samples submitted in saline to Pathology. Additional imaging was performed after biopsy. COMPLICATIONS: None immediate. FINDINGS: There are two adjacent prominent soft tissue masses in the right lateral subpectoral region of the chest wall. The more medial mass was chosen for sampling and measures approximately 2.9 x 2.1 x 2.5 cm. Solid tissue was obtained. IMPRESSION: Ultrasound-guided core biopsy performed of a right subpectoral mass measuring approximately 2.9 cm in greatest diameter. Electronically Signed   By: Aletta Edouard M.D.   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 I would recommend treatment to her areas of kyphoplasty along the lower thoracic spine. She also complaining of pain in the lower lumbar spine and may benefit  from radiation treatment to this area. In addition the patient is having pain along the right lower lateral rib cage area. PET scan shows activity in this area. We may need to consider treatments to this region too. She is also complaining of pain to her right neck, has a palpable lymph node in this area, which we may consider treating as well. She is also complaining of headaches now, and we may need to consider brain MRI.   Today, I talked to the patient and mother about the findings and work-up thus far.  We discussed the natural history of metastatic breast cancer and general treatment, highlighting the role of radiotherapy in the management.  We discussed the available radiation techniques, and focused on the details of logistics and delivery.  We reviewed the anticipated acute and late sequelae associated with radiation in this setting.  The patient was encouraged to ask questions that I answered to the best of my ability.  A patient consent form was discussed and signed.  We retained a copy for our records.  The patient would like to proceed with radiation and will be scheduled for CT simulation.   PLAN: We will schedule her for CT simulation in the near future. Anticipate 10-14 treatments directed at the thoracic spine in the  site of her kyphoplasty. potential additional areas as mentioned above. May also need to consider holding Avastin during her radiation therapy.   ------------------------------------------------  Blair Promise, PhD, MD  This document serves as a record of services personally performed by Gery Pray, MD. It was created on his behalf by Wilburn Mylar, a trained medical scribe. The creation of this record is based on the scribe's personal observations and the provider's statements to them. This document has been checked and approved by the attending provider.

## 2018-07-09 ENCOUNTER — Encounter: Payer: Self-pay | Admitting: General Practice

## 2018-07-09 NOTE — Progress Notes (Signed)
[facility] Psychosocial Distress Screening Clinical Social Work  Clinical Social Work was referred by distress screening protocol.  The patient scored a 6 on the Psychosocial Distress Thermometer which indicates moderate distress. Clinical Social Worker contacted patient by phone to assess for distress and other psychosocial needs. Unable to reach patient by phone, left VM w information about [place], contact information and encouragement to call for help/resources.  CSW will try to follow up when patient is here for radiation therapy after appointments are scheduled.    ONCBCN DISTRESS SCREENING 07/08/2018  Screening Type Initial Screening  Distress experienced in past week (1-10) 6  Practical problem type Work/school  Emotional problem type Nervousness/Anxiety;Feeling hopeless  Physical Problem type Pain;Nausea/vomiting;Sleep/insomnia;Getting around;Loss of appetitie;Constipation/diarrhea;Changes in urination;Tingling hands/feet    Clinical Social Worker follow up needed: Yes.    Attempt contact when here for radiation tx.    If yes, follow up plan:  Beverely Pace, [place], LCSW Clinical Social Worker Phone:  [phone]

## 2018-07-13 ENCOUNTER — Ambulatory Visit: Payer: PRIVATE HEALTH INSURANCE | Admitting: Psychiatry

## 2018-07-13 ENCOUNTER — Telehealth: Payer: Self-pay | Admitting: Hematology & Oncology

## 2018-07-13 NOTE — Telephone Encounter (Signed)
Returned call to pt re moving her 2/24 appt to an earlier time due to a conflict with radonc tha same day. Inform pt that we can move her appt to 1015 am with Judson Roch if she would like. Asked pt to return call to tell me what she wises to do

## 2018-07-14 ENCOUNTER — Ambulatory Visit
Admission: RE | Admit: 2018-07-14 | Discharge: 2018-07-14 | Disposition: A | Discharge status: Home / Self Care | Payer: PRIVATE HEALTH INSURANCE | Source: Ambulatory Visit | Attending: Radiation Oncology | Admitting: Radiation Oncology

## 2018-07-14 ENCOUNTER — Encounter (HOSPITAL_COMMUNITY): Payer: Self-pay | Admitting: Hematology & Oncology

## 2018-07-14 DIAGNOSIS — C50919 Malignant neoplasm of unspecified site of unspecified female breast: Secondary | ICD-10-CM

## 2018-07-14 DIAGNOSIS — C7951 Secondary malignant neoplasm of bone: Secondary | ICD-10-CM

## 2018-07-14 DIAGNOSIS — Z171 Estrogen receptor negative status [ER-]: Principal | ICD-10-CM

## 2018-07-14 DIAGNOSIS — C50111 Malignant neoplasm of central portion of right female breast: Secondary | ICD-10-CM

## 2018-07-14 NOTE — Progress Notes (Signed)
  Radiation Oncology         [phone]) [phone] ________________________________  Name: Sophia [name] MRN: [id number]  Date: 07/14/2018  DOB: [birth year]  SIMULATION AND TREATMENT PLANNING NOTE    ICD-10-CM   1. Malignant neoplasm of central portion of right breast in female, estrogen receptor negative ([place]) C50.111    Z17.1   2. 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  Identity was confirmed.  All relevant records and images related to the planned course of therapy were reviewed.  The patient freely provided informed written consent to proceed with treatment after reviewing the details related to the planned course of therapy. The consent form was witnessed and verified by the simulation staff.  Then, the patient was set-up in a stable reproducible  supine position for radiation therapy.  CT images were obtained.  Surface markings were placed.  The CT images were loaded into the planning software.  Then the target and avoidance structures were contoured.  Treatment planning then occurred.  The radiation prescription was entered and confirmed.  Then, I designed and supervised the construction of a total of 8 medically necessary complex treatment devices.  I have requested : 3D Simulation  I have requested a DVH of the following structures: lungs, kidneys,bowel.  I have ordered:CBC  PLAN:  The patient will receive 35 Gy in 14 fractions directed at the right axillary nodal group region.  The right rib metastasis will receive 30 gray in 10 fractions.  The lumbar spine area will receive 35 gray in 14 fractions.  Special Treatment Procedure Note: The patient will be receiving  radiosensitizing chemotherapy. Given the potential of increased toxicities related to combined therapy and the necessity for close monitoring of the patient and blood work, this constitutes a special treatment procedure.   -----------------------------------  Blair Promise, PhD, MD  This document serves as a record of services personally performed by Gery Pray, MD. It was created on his behalf by Mary-Margaret Loma Messing, a trained medical scribe. The creation of this record is based on the scribe's personal observations and the provider's statements to them. This document has been checked and approved by the attending provider.

## 2018-07-16 ENCOUNTER — Encounter: Payer: Self-pay | Admitting: Hematology & Oncology

## 2018-07-19 ENCOUNTER — Ambulatory Visit: Payer: PRIVATE HEALTH INSURANCE | Admitting: Radiation Oncology

## 2018-07-19 ENCOUNTER — Telehealth: Payer: Self-pay | Admitting: Hematology & Oncology

## 2018-07-19 ENCOUNTER — Inpatient Hospital Stay: Payer: PRIVATE HEALTH INSURANCE

## 2018-07-19 ENCOUNTER — Other Ambulatory Visit: Payer: Self-pay

## 2018-07-19 ENCOUNTER — Encounter: Payer: Self-pay | Admitting: Hematology & Oncology

## 2018-07-19 ENCOUNTER — Inpatient Hospital Stay (HOSPITAL_BASED_OUTPATIENT_CLINIC_OR_DEPARTMENT_OTHER): Payer: PRIVATE HEALTH INSURANCE | Admitting: Hematology & Oncology

## 2018-07-19 ENCOUNTER — Inpatient Hospital Stay: Payer: PRIVATE HEALTH INSURANCE | Attending: Hematology & Oncology

## 2018-07-19 VITALS — BP 103/72 | HR 73 | Temp 97.0°F | Wt 167.1 lb

## 2018-07-19 DIAGNOSIS — Z171 Estrogen receptor negative status [ER-]: Secondary | ICD-10-CM

## 2018-07-19 DIAGNOSIS — C7951 Secondary malignant neoplasm of bone: Secondary | ICD-10-CM | POA: Insufficient documentation

## 2018-07-19 DIAGNOSIS — Z9013 Acquired absence of bilateral breasts and nipples: Secondary | ICD-10-CM | POA: Diagnosis not present

## 2018-07-19 DIAGNOSIS — C50919 Malignant neoplasm of unspecified site of unspecified female breast: Secondary | ICD-10-CM

## 2018-07-19 DIAGNOSIS — C50111 Malignant neoplasm of central portion of right female breast: Secondary | ICD-10-CM

## 2018-07-19 DIAGNOSIS — C50911 Malignant neoplasm of unspecified site of right female breast: Secondary | ICD-10-CM | POA: Diagnosis not present

## 2018-07-19 DIAGNOSIS — Z5112 Encounter for antineoplastic immunotherapy: Secondary | ICD-10-CM | POA: Insufficient documentation

## 2018-07-19 DIAGNOSIS — R630 Anorexia: Secondary | ICD-10-CM

## 2018-07-19 DIAGNOSIS — Z1509 Genetic susceptibility to other malignant neoplasm: Secondary | ICD-10-CM

## 2018-07-19 DIAGNOSIS — Z1501 Genetic susceptibility to malignant neoplasm of breast: Secondary | ICD-10-CM

## 2018-07-19 DIAGNOSIS — D500 Iron deficiency anemia secondary to blood loss (chronic): Secondary | ICD-10-CM

## 2018-07-19 LAB — CBC WITH DIFFERENTIAL (CANCER CENTER ONLY)
Abs Immature Granulocytes: 0.01 10*3/uL (ref 0.00–0.07)
Basophils Absolute: 0 10*3/uL (ref 0.0–0.1)
Basophils Relative: 1 %
Eosinophils Absolute: 0.1 10*3/uL (ref 0.0–0.5)
Eosinophils Relative: 1 %
HCT: 36.4 % (ref 36.0–46.0)
Hemoglobin: 12 g/dL (ref 12.0–15.0)
IMMATURE GRANULOCYTES: 0 %
Lymphocytes Relative: 24 %
Lymphs Abs: 1.4 10*3/uL (ref 0.7–4.0)
MCH: 28.7 pg (ref 26.0–34.0)
MCHC: 33 g/dL (ref 30.0–36.0)
MCV: 87.1 fL (ref 80.0–100.0)
Monocytes Absolute: 0.4 10*3/uL (ref 0.1–1.0)
Monocytes Relative: 6 %
Neutro Abs: 3.8 10*3/uL (ref 1.7–7.7)
Neutrophils Relative %: 68 %
Platelet Count: 175 10*3/uL (ref 150–400)
RBC: 4.18 MIL/uL (ref 3.87–5.11)
RDW: 14.7 % (ref 11.5–15.5)
WBC Count: 5.6 10*3/uL (ref 4.0–10.5)
nRBC: 0 % (ref 0.0–0.2)

## 2018-07-19 LAB — CMP (CANCER CENTER ONLY)
ALK PHOS: 63 U/L (ref 38–126)
ALT: 20 U/L (ref 0–44)
ANION GAP: 9 (ref 5–15)
AST: 32 U/L (ref 15–41)
Albumin: 4.8 g/dL (ref 3.5–5.0)
BUN: 13 mg/dL (ref 6–20)
CO2: 25 mmol/L (ref 22–32)
Calcium: 10.3 mg/dL (ref 8.9–10.3)
Chloride: 101 mmol/L (ref 98–111)
Creatinine: 0.84 mg/dL (ref 0.44–1.00)
GFR, Est AFR Am: >60 mL/min (ref >60)
GFR, Estimated: >60 mL/min (ref >60)
Glucose, Bld: 107 mg/dL — ABNORMAL HIGH (ref 70–99)
Potassium: 3.9 mmol/L (ref 3.5–5.1)
Sodium: 135 mmol/L (ref 135–145)
Total Bilirubin: 0.6 mg/dL (ref 0.3–1.2)
Total Protein: 7.9 g/dL (ref 6.5–8.1)

## 2018-07-19 LAB — LACTATE DEHYDROGENASE: LDH: 393 U/L — ABNORMAL HIGH (ref 98–192)

## 2018-07-19 MED ORDER — DRONABINOL 2.5 MG PO CAPS: 2.5000 mg | ORAL_CAPSULE | Freq: Three times a day (TID) | ORAL | 0 refills | Status: DC

## 2018-07-19 MED ORDER — SODIUM CHLORIDE 0.9 % IV SOLN
1200.0000 mg | Freq: Once | INTRAVENOUS | Status: AC
  Administered 2018-07-19: 1200 mg via INTRAVENOUS
  Filled 2018-07-19: qty 20

## 2018-07-19 MED ORDER — TRAMADOL HCL 50 MG PO TABS: 50.0000 mg | ORAL_TABLET | Freq: Four times a day (QID) | ORAL | 0 refills | Status: DC | PRN

## 2018-07-19 MED ORDER — HEPARIN SOD (PORK) LOCK FLUSH 100 UNIT/ML IV SOLN
500.0000 [IU] | Freq: Once | INTRAVENOUS | Status: AC | PRN
  Administered 2018-07-19: 500 [IU]
  Filled 2018-07-19: qty 5

## 2018-07-19 MED ORDER — SODIUM CHLORIDE 0.9% FLUSH
10.0000 mL | INTRAVENOUS | Status: DC | PRN
  Administered 2018-07-19: 10 mL
  Filled 2018-07-19: qty 10

## 2018-07-19 MED ORDER — SODIUM CHLORIDE 0.9 % IV SOLN
Freq: Once | INTRAVENOUS | Status: AC
  Administered 2018-07-19: 12:00:00 via INTRAVENOUS
  Filled 2018-07-19: qty 250

## 2018-07-19 NOTE — Patient Instructions (Signed)
Implanted Port Insertion, Care After  This sheet gives you information about how to care for yourself after your procedure. Your health care provider may also give you more specific instructions. If you have problems or questions, contact your health care provider.  What can I expect after the procedure?  After the procedure, it is common to have:  · Discomfort at the port insertion site.  · Bruising on the skin over the port. This should improve over 3-4 days.  Follow these instructions at home:  Port care  · After your port is placed, you will get a manufacturer's information card. The card has information about your port. Keep this card with you at all times.  · Take care of the port as told by your health care provider. Ask your health care provider if you or a family member can get training for taking care of the port at home. A home health care nurse may also take care of the port.  · Make sure to remember what type of port you have.  Incision care         · Follow instructions from your health care provider about how to take care of your port insertion site. Make sure you:  ? Wash your hands with soap and water before and after you change your bandage (dressing). If soap and water are not available, use hand sanitizer.  ? Change your dressing as told by your health care provider.  ? Leave stitches (sutures), skin glue, or adhesive strips in place. These skin closures may need to stay in place for 2 weeks or longer. If adhesive strip edges start to loosen and curl up, you may trim the loose edges. Do not remove adhesive strips completely unless your health care provider tells you to do that.  · Check your port insertion site every day for signs of infection. Check for:  ? Redness, swelling, or pain.  ? Fluid or blood.  ? Warmth.  ? Pus or a bad smell.  Activity  · Return to your normal activities as told by your health care provider. Ask your health care provider what activities are safe for you.  · Do not  lift anything that is heavier than 10 lb (4.5 kg), or the limit that you are told, until your health care provider says that it is safe.  General instructions  · Take over-the-counter and prescription medicines only as told by your health care provider.  · Do not take baths, swim, or use a hot tub until your health care provider approves. Ask your health care provider if you may take showers. You may only be allowed to take sponge baths.  · Do not drive for 24 hours if you were given a sedative during your procedure.  · Wear a medical alert bracelet in case of an emergency. This will tell any health care providers that you have a port.  · Keep all follow-up visits as told by your health care provider. This is important.  Contact a health care provider if:  · You cannot flush your port with saline as directed, or you cannot draw blood from the port.  · You have a fever or chills.  · You have redness, swelling, or pain around your port insertion site.  · You have fluid or blood coming from your port insertion site.  · Your port insertion site feels warm to the touch.  · You have pus or a bad smell coming from the port   insertion site.  Get help right away if:  · You have chest pain or shortness of breath.  · You have bleeding from your port that you cannot control.  Summary  · Take care of the port as told by your health care provider. Keep the manufacturer's information card with you at all times.  · Change your dressing as told by your health care provider.  · Contact a health care provider if you have a fever or chills or if you have redness, swelling, or pain around your port insertion site.  · Keep all follow-up visits as told by your health care provider.  This information is not intended to replace advice given to you by your health care provider. Make sure you discuss any questions you have with your health care provider.  Document Released: 03/02/2013 Document Revised: 12/08/2017 Document Reviewed:  12/08/2017  Elsevier Interactive Patient Education © 2019 [employer]

## 2018-07-19 NOTE — Telephone Encounter (Signed)
Appointments scheduled calendar printed per 2/24 los °

## 2018-07-19 NOTE — Progress Notes (Signed)
Hematology and Oncology Follow Up Visit  Sophia [name] [id number] Jul 28, 1990 28 y.o. 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 (+) - ER-/PR-/HER2+  --  Vertebral mets on 05/17/2018  --  TMB -low;  MSI - stable; PIK3CA - wt  Iron deficiency anemia   Past Therapy: Taxotere/Carboplatin/Herceptin/Perjeta -s/p cycle 4 S/P Bilateral mastectomies - 10/09/2017 Ovarian ablation with Lupron - stopped 11/06/2017 S/p TAH-BSO on 01/19/2018  Current Therapy:   Tecentriq/Avastin -- q 3 wk cycles -- start 06/23/2018 Lynparza (Olapirib)   300 mg po BID -- start 06/23/2018 Radiation Therapy -- back and ribs - start on 07/21/2018 IV Iron as indicated Zometa 4 mg IV q 3 month -- next dose 08/2018   Interim History: Sophia [name] is here today for follow-up.  I do think that she is responding to new treatment.  I think what she is responding to mostly is at the [place]  She has seen radiation oncology.  She will start therapy on Wednesday.  She did not like the fentanyl and the oxycodone.  She also was not to tolerant of gabapentin.  She turned those in for disposal.  She would like a non-narcotic pain medication.  I will try her on Ultram.  Hopefully, she will tolerate this.  She has lost weight.  She is not eating well.  I will try her on Marinol.  I will dose her at 2.5 mg p.o. 3 times daily.  I told her that if she could somehow managed to get the real marijuana leaf, then this might help her with a eating and with nausea and with pain.  Her right neck does not look nearly as swollen.  Again, I think that she is responding to treatment.  We have not yet started Avastin on her.  I want to hold on Avastin until after radiation therapy is completed.  It looks like that she is probably going to be HER-2 negative.  I am awaiting the confirmatory test on the HER-2 protein.  Thankfully there have been recent  trials that have shown significant success for patients with metastatic breast cancer that are HER-2 positive.  She has not had diarrhea.  She has had a little bit of difficulty swallowing.  I am not sure why this would be.  She does not have any hoarseness.  She has had no nausea or vomiting.  She has had no leg swelling.  We really are trying to be as "targeted" as possible with respect to her cancer therapy.  I think the fact that she is  BRCA1(+) really is the key for her success.  Hopefully, she will complete her radiation therapy when she starts it.  She said that she has 15 treatments scheduled.  She has had no fever.  There is been no bleeding.  She has had no headache.  We are going to have to be very cautious with her with respect to making sure that she does not develop brain metastasis.  Currently, her performance status is ECOG 1.     Medications:  Allergies as of 07/19/2018   No Known Allergies     Medication List       Accurate as of July 19, 2018 11:40 AM. Always use your most recent med list.        ado-trastuzumab emtansine 100 MG Solr Commonly known as:  KADCYLA Inject 15 mLs (300 mg total) into the vein every 21 (  twenty-one) days.   fentaNYL 25 MCG/HR Commonly known as:  [place] 1 patch onto the skin every 3 (three) days.   gabapentin 300 MG capsule Commonly known as:  NEURONTIN Take 1 capsule (300 mg total) by mouth 4 (four) times daily.   meloxicam 15 MG tablet Commonly known as:  MOBIC Take 1 tablet (15 mg total) by mouth daily.   metoCLOPramide 10 MG tablet Commonly known as:  REGLAN Take 1 tablet (10 mg total) by mouth 3 (three) times daily before meals.   olaparib 150 MG tablet Commonly known as:  LYNPARZA Take 2 tablets (300 mg total) by mouth 2 (two) times daily. Swallow whole.   Oxycodone HCl 10 MG Tabs Take 0.5 tablets (5 mg total) by mouth every 4 (four) hours as needed for severe pain.   senna 8.6 MG Tabs  tablet Commonly known as:  SENOKOT Take 1 tablet by mouth daily as needed for mild constipation.       Allergies: No Known Allergies  Past Medical History, Surgical history, Social history, and Family History were reviewed and updated.  Review of Systems: Review of Systems  Constitutional: Negative.   HENT: Negative.   Eyes: Negative.   Respiratory: Negative.   Cardiovascular: Negative.   Gastrointestinal: Negative.   Genitourinary: Negative.   Musculoskeletal: Positive for back pain.  Skin: Negative.   Neurological: Negative.   Endo/Heme/Allergies: Negative.   Psychiatric/Behavioral: Negative.      Physical Exam:  weight is 167 lb 1.6 oz (75.8 kg). Her oral temperature is 97 F (36.1 C) (abnormal). Her blood pressure is 103/72 and her pulse is 73. Her oxygen saturation is 100%.   Wt Readings from Last 3 Encounters:  07/19/18 167 lb 1.6 oz (75.8 kg)  07/08/18 174 lb 12.8 oz (79.3 kg)  06/24/18 183 lb (83 kg)    Physical Exam Vitals signs reviewed.  Constitutional:      Comments: Breast exam shows bilateral mastectomies with implants.  These are well-healed.  There is no erythema.  There is no warmth.  There is no palpable axillary lymph nodes.  She does have some fullness in the right axilla.  HENT:     Head: Normocephalic and atraumatic.  Eyes:     Pupils: Pupils are equal, round, and reactive to light.  Neck:     Musculoskeletal: Normal range of motion.     Comments: She has some improvement in the fullness in the right cervical and supraclavicular region.   Cardiovascular:     Rate and Rhythm: Normal rate and regular rhythm.     Heart sounds: Normal heart sounds.  Pulmonary:     Effort: Pulmonary effort is normal.     Breath sounds: Normal breath sounds.  Abdominal:     General: Bowel sounds are normal.     Palpations: Abdomen is soft.  Musculoskeletal: Normal range of motion.        General: No tenderness or deformity.     Comments: Back exam shows no  tenderness over the spine.  There may be some slight spasms in the right lumbar paraspinal muscles.  There is some tenderness along the right flank.  Lymphadenopathy:     Cervical: No cervical adenopathy.  Skin:    General: Skin is warm and dry.     Findings: No erythema or rash.  Neurological:     Mental Status: She is alert and oriented to person, place, and time.  Psychiatric:        Behavior: Behavior  normal.        Thought Content: Thought content normal.        Judgment: Judgment normal.      Lab Results  Component Value Date   WBC 5.6 07/19/2018   HGB 12.0 07/19/2018   HCT 36.4 07/19/2018   MCV 87.1 07/19/2018   PLT 175 07/19/2018   Lab Results  Component Value Date   FERRITIN 567 (H) 05/27/2018   IRON 79 05/27/2018   TIBC 253 05/27/2018   UIBC 174 05/27/2018   IRONPCTSAT 31 05/27/2018   Lab Results  Component Value Date   RBC 4.18 07/19/2018   No results found for: KPAFRELGTCHN, LAMBDASER, KAPLAMBRATIO No results found for: IGGSERUM, IGA, IGMSERUM No results found for: Odetta Pink, SPEI   Chemistry      Component Value Date/Time   NA 135 07/19/2018 1047   K 3.9 07/19/2018 1047   CL 101 07/19/2018 1047   CO2 25 07/19/2018 1047   BUN 13 07/19/2018 1047   CREATININE 0.84 07/19/2018 1047      Component Value Date/Time   CALCIUM 10.3 07/19/2018 1047   ALKPHOS 63 07/19/2018 1047   AST 32 07/19/2018 1047   ALT 20 07/19/2018 1047   BILITOT 0.6 07/19/2018 1047       Impression and Plan: Sophia [name] is a very pleasant 28 yo caucasian female with locally advanced stage III ductal carcinoma of the right breast, HER-2+ and ER negative. She is BRCA positive.   Hopefully, we will be able to keep Sophia [name] on therapy for a while.  Again, I truly believe that the [place] is going to be incredibly helpful for her.  I think radiation will also be helpful.  At this really is about her quality of life.  This  is really about making sure that we are as aggressive as we can be.  I forgot to mention that she has talked to a psychologist.  She really felt that this was helpful.  I am so grateful that Dr. Ouida Sills was able to see her so quickly and to talk with her about her concerns.  I would like to see her back in 3 weeks.  Again, we have to be aggressive with our follow-up.     Volanda Napoleon, MD 2/24/202011:40 AM

## 2018-07-19 NOTE — Patient Instructions (Signed)
Atezolizumab injection What is this medicine? ATEZOLIZUMAB (a te zoe LIZ ue mab) is a monoclonal antibody. It is used to treat bladder cancer (urothelial cancer), non-small cell lung cancer, small cell lung cancer, and breast cancer. This medicine may be used for other purposes; ask your health care provider or pharmacist if you have questions. COMMON BRAND NAME(S): Tecentriq What should I tell my health care provider before I take this medicine? They need to know if you have any of these conditions: -diabetes -immune system problems -infection -inflammatory bowel disease -liver disease -lung or breathing disease -lupus -nervous system problems like myasthenia gravis or Guillain-Barre syndrome -organ transplant -an unusual or allergic reaction to atezolizumab, other medicines, foods, dyes, or preservatives -pregnant or trying to get pregnant -breast-feeding How should I use this medicine? This medicine is for infusion into a vein. It is given by a health care professional in a hospital or clinic setting. A special MedGuide will be given to you before each treatment. Be sure to read this information carefully each time. Talk to your pediatrician regarding the use of this medicine in children. Special care may be needed. Overdosage: If you think you have taken too much of this medicine contact a poison control center or emergency room at once. NOTE: This medicine is only for you. Do not share this medicine with others. What if I miss a dose? It is important not to miss your dose. Call your doctor or health care professional if you are unable to keep an appointment. What may interact with this medicine? Interactions have not been studied. This list may not describe all possible interactions. Give your health care provider a list of all the medicines, herbs, non-prescription drugs, or dietary supplements you use. Also tell them if you smoke, drink alcohol, or use illegal drugs. Some items  may interact with your medicine. What should I watch for while using this medicine? Your condition will be monitored carefully while you are receiving this medicine. You may need blood work done while you are taking this medicine. Do not become pregnant while taking this medicine or for at least 5 months after stopping it. Women should inform their doctor if they wish to become pregnant or think they might be pregnant. There is a potential for serious side effects to an unborn child. Talk to your health care professional or pharmacist for more information. Do not breast-feed an infant while taking this medicine or for at least 5 months after the last dose. What side effects may I notice from receiving this medicine? Side effects that you should report to your doctor or health care professional as soon as possible: -allergic reactions like skin rash, itching or hives, swelling of the face, lips, or tongue -black, tarry stools -bloody or watery diarrhea -breathing problems -changes in vision -chest pain or chest tightness -chills -facial flushing -fever -headache -signs and symptoms of high blood sugar such as dizziness; dry mouth; dry skin; fruity breath; nausea; stomach pain; increased hunger or thirst; increased urination -signs and symptoms of liver injury like dark yellow or brown urine; general ill feeling or flu-like symptoms; light-colored stools; loss of appetite; nausea; right upper belly pain; unusually weak or tired; yellowing of the eyes or skin -stomach pain -trouble passing urine or change in the amount of urine Side effects that usually do not require medical attention (report to your doctor or health care professional if they continue or are bothersome): -cough -diarrhea -joint pain -muscle pain -muscle weakness -tiredness -weight loss   This list may not describe all possible side effects. Call your doctor for medical advice about side effects. You may report side effects  to FDA at 1-800-FDA-1088. Where should I keep my medicine? This drug is given in a hospital or clinic and will not be stored at home. NOTE: This sheet is a summary. It may not cover all possible information. If you have questions about this medicine, talk to your doctor, pharmacist, or health care provider.  2019 Elsevier/Gold Standard (2017-08-14 09:33:38)  

## 2018-07-20 ENCOUNTER — Ambulatory Visit: Payer: PRIVATE HEALTH INSURANCE

## 2018-07-20 LAB — CANCER ANTIGEN 27.29: CA 27.29: 22.5 U/mL (ref 0.0–38.6)

## 2018-07-20 LAB — LUTEINIZING HORMONE: LH: 36 m[IU]/mL

## 2018-07-20 LAB — FOLLICLE STIMULATING HORMONE: FSH: 81.7 m[IU]/mL

## 2018-07-21 ENCOUNTER — Ambulatory Visit: Payer: PRIVATE HEALTH INSURANCE

## 2018-07-21 ENCOUNTER — Ambulatory Visit: Payer: PRIVATE HEALTH INSURANCE | Admitting: Radiation Oncology

## 2018-07-21 ENCOUNTER — Ambulatory Visit
Admission: RE | Admit: 2018-07-21 | Discharge: 2018-07-21 | Disposition: A | Discharge status: Home / Self Care | Payer: PRIVATE HEALTH INSURANCE | Source: Ambulatory Visit | Attending: Radiation Oncology | Admitting: Radiation Oncology

## 2018-07-21 DIAGNOSIS — C50919 Malignant neoplasm of unspecified site of unspecified female breast: Secondary | ICD-10-CM | POA: Diagnosis not present

## 2018-07-21 MED FILL — LYNPARZA 150 MG TABLET: 150 | 30 days supply | Qty: 120 | Fill #1

## 2018-07-22 ENCOUNTER — Ambulatory Visit
Admission: RE | Admit: 2018-07-22 | Discharge: 2018-07-22 | Disposition: A | Discharge status: Home / Self Care | Payer: PRIVATE HEALTH INSURANCE | Source: Ambulatory Visit | Attending: Radiation Oncology | Admitting: Radiation Oncology

## 2018-07-22 ENCOUNTER — Ambulatory Visit: Payer: PRIVATE HEALTH INSURANCE

## 2018-07-22 DIAGNOSIS — C50919 Malignant neoplasm of unspecified site of unspecified female breast: Secondary | ICD-10-CM | POA: Diagnosis not present

## 2018-07-22 LAB — ESTRADIOL, ULTRA SENS: Estradiol, Sensitive: 3.3 pg/mL

## 2018-07-23 ENCOUNTER — Ambulatory Visit
Admission: RE | Admit: 2018-07-23 | Discharge: 2018-07-23 | Disposition: A | Discharge status: Home / Self Care | Payer: PRIVATE HEALTH INSURANCE | Source: Ambulatory Visit | Attending: Radiation Oncology | Admitting: Radiation Oncology

## 2018-07-23 ENCOUNTER — Ambulatory Visit: Payer: PRIVATE HEALTH INSURANCE

## 2018-07-23 DIAGNOSIS — C50919 Malignant neoplasm of unspecified site of unspecified female breast: Secondary | ICD-10-CM | POA: Diagnosis not present

## 2018-07-26 ENCOUNTER — Other Ambulatory Visit: Payer: Self-pay | Admitting: *Deleted

## 2018-07-26 ENCOUNTER — Ambulatory Visit: Payer: PRIVATE HEALTH INSURANCE

## 2018-07-26 ENCOUNTER — Ambulatory Visit
Admission: RE | Admit: 2018-07-26 | Discharge: 2018-07-26 | Disposition: A | Discharge status: Home / Self Care | Payer: PRIVATE HEALTH INSURANCE | Source: Ambulatory Visit | Attending: Radiation Oncology | Admitting: Radiation Oncology

## 2018-07-26 DIAGNOSIS — Z171 Estrogen receptor negative status [ER-]: Secondary | ICD-10-CM

## 2018-07-26 DIAGNOSIS — C50919 Malignant neoplasm of unspecified site of unspecified female breast: Secondary | ICD-10-CM

## 2018-07-26 DIAGNOSIS — C7951 Secondary malignant neoplasm of bone: Principal | ICD-10-CM

## 2018-07-26 DIAGNOSIS — C50111 Malignant neoplasm of central portion of right female breast: Secondary | ICD-10-CM

## 2018-07-26 DIAGNOSIS — R1011 Right upper quadrant pain: Secondary | ICD-10-CM

## 2018-07-26 MED ORDER — ONDANSETRON 8 MG PO TBDP: 8.0000 mg | ORAL_TABLET | Freq: Four times a day (QID) | ORAL | 0 refills | Status: DC | PRN

## 2018-07-27 ENCOUNTER — Ambulatory Visit: Payer: PRIVATE HEALTH INSURANCE | Admitting: Psychiatry

## 2018-07-27 ENCOUNTER — Ambulatory Visit: Payer: PRIVATE HEALTH INSURANCE

## 2018-07-27 ENCOUNTER — Ambulatory Visit
Admission: RE | Admit: 2018-07-27 | Discharge: 2018-07-27 | Disposition: A | Discharge status: Home / Self Care | Payer: PRIVATE HEALTH INSURANCE | Source: Ambulatory Visit | Attending: Radiation Oncology | Admitting: Radiation Oncology

## 2018-07-27 DIAGNOSIS — C50919 Malignant neoplasm of unspecified site of unspecified female breast: Secondary | ICD-10-CM | POA: Diagnosis not present

## 2018-07-28 ENCOUNTER — Ambulatory Visit: Payer: PRIVATE HEALTH INSURANCE

## 2018-07-28 ENCOUNTER — Ambulatory Visit
Admission: RE | Admit: 2018-07-28 | Discharge: 2018-07-28 | Disposition: A | Discharge status: Home / Self Care | Payer: PRIVATE HEALTH INSURANCE | Source: Ambulatory Visit | Attending: Radiation Oncology | Admitting: Radiation Oncology

## 2018-07-28 DIAGNOSIS — C50919 Malignant neoplasm of unspecified site of unspecified female breast: Secondary | ICD-10-CM | POA: Diagnosis not present

## 2018-07-29 ENCOUNTER — Ambulatory Visit
Admission: RE | Admit: 2018-07-29 | Discharge: 2018-07-29 | Disposition: A | Discharge status: Home / Self Care | Payer: PRIVATE HEALTH INSURANCE | Source: Ambulatory Visit | Attending: Radiation Oncology | Admitting: Radiation Oncology

## 2018-07-29 ENCOUNTER — Ambulatory Visit: Payer: PRIVATE HEALTH INSURANCE

## 2018-07-29 DIAGNOSIS — C50919 Malignant neoplasm of unspecified site of unspecified female breast: Secondary | ICD-10-CM | POA: Diagnosis not present

## 2018-07-30 ENCOUNTER — Ambulatory Visit
Admission: RE | Admit: 2018-07-30 | Discharge: 2018-07-30 | Disposition: A | Discharge status: Home / Self Care | Payer: PRIVATE HEALTH INSURANCE | Source: Ambulatory Visit | Attending: Radiation Oncology | Admitting: Radiation Oncology

## 2018-07-30 ENCOUNTER — Ambulatory Visit: Payer: PRIVATE HEALTH INSURANCE

## 2018-07-30 DIAGNOSIS — C50919 Malignant neoplasm of unspecified site of unspecified female breast: Secondary | ICD-10-CM | POA: Diagnosis not present

## 2018-08-02 ENCOUNTER — Ambulatory Visit: Payer: PRIVATE HEALTH INSURANCE

## 2018-08-02 ENCOUNTER — Ambulatory Visit
Admission: RE | Admit: 2018-08-02 | Discharge: 2018-08-02 | Disposition: A | Discharge status: Home / Self Care | Payer: PRIVATE HEALTH INSURANCE | Source: Ambulatory Visit | Attending: Radiation Oncology | Admitting: Radiation Oncology

## 2018-08-02 DIAGNOSIS — C50919 Malignant neoplasm of unspecified site of unspecified female breast: Secondary | ICD-10-CM | POA: Diagnosis not present

## 2018-08-03 ENCOUNTER — Ambulatory Visit: Payer: PRIVATE HEALTH INSURANCE

## 2018-08-03 ENCOUNTER — Ambulatory Visit
Admission: RE | Admit: 2018-08-03 | Discharge: 2018-08-03 | Disposition: A | Discharge status: Home / Self Care | Payer: PRIVATE HEALTH INSURANCE | Source: Ambulatory Visit | Attending: Radiation Oncology | Admitting: Radiation Oncology

## 2018-08-03 DIAGNOSIS — C50919 Malignant neoplasm of unspecified site of unspecified female breast: Secondary | ICD-10-CM | POA: Diagnosis not present

## 2018-08-04 ENCOUNTER — Ambulatory Visit: Payer: PRIVATE HEALTH INSURANCE

## 2018-08-04 ENCOUNTER — Other Ambulatory Visit: Payer: Self-pay

## 2018-08-04 ENCOUNTER — Ambulatory Visit
Admission: RE | Admit: 2018-08-04 | Discharge: 2018-08-04 | Disposition: A | Discharge status: Home / Self Care | Payer: PRIVATE HEALTH INSURANCE | Source: Ambulatory Visit | Attending: Radiation Oncology | Admitting: Radiation Oncology

## 2018-08-04 DIAGNOSIS — C50919 Malignant neoplasm of unspecified site of unspecified female breast: Secondary | ICD-10-CM | POA: Diagnosis not present

## 2018-08-05 ENCOUNTER — Ambulatory Visit: Payer: PRIVATE HEALTH INSURANCE

## 2018-08-05 ENCOUNTER — Other Ambulatory Visit: Payer: Self-pay

## 2018-08-05 ENCOUNTER — Ambulatory Visit
Admission: RE | Admit: 2018-08-05 | Discharge: 2018-08-05 | Disposition: A | Discharge status: Home / Self Care | Payer: PRIVATE HEALTH INSURANCE | Source: Ambulatory Visit | Attending: Radiation Oncology | Admitting: Radiation Oncology

## 2018-08-05 DIAGNOSIS — C50919 Malignant neoplasm of unspecified site of unspecified female breast: Secondary | ICD-10-CM | POA: Diagnosis not present

## 2018-08-06 ENCOUNTER — Ambulatory Visit
Admission: RE | Admit: 2018-08-06 | Discharge: 2018-08-06 | Disposition: A | Discharge status: Home / Self Care | Payer: PRIVATE HEALTH INSURANCE | Source: Ambulatory Visit | Attending: Radiation Oncology | Admitting: Radiation Oncology

## 2018-08-06 ENCOUNTER — Ambulatory Visit: Payer: PRIVATE HEALTH INSURANCE

## 2018-08-06 ENCOUNTER — Other Ambulatory Visit: Payer: Self-pay

## 2018-08-06 DIAGNOSIS — C50919 Malignant neoplasm of unspecified site of unspecified female breast: Secondary | ICD-10-CM | POA: Diagnosis not present

## 2018-08-09 ENCOUNTER — Ambulatory Visit
Admission: RE | Admit: 2018-08-09 | Discharge: 2018-08-09 | Disposition: A | Discharge status: Home / Self Care | Payer: PRIVATE HEALTH INSURANCE | Source: Ambulatory Visit | Attending: Radiation Oncology | Admitting: Radiation Oncology

## 2018-08-09 ENCOUNTER — Ambulatory Visit: Payer: PRIVATE HEALTH INSURANCE

## 2018-08-09 ENCOUNTER — Other Ambulatory Visit: Payer: Self-pay

## 2018-08-09 DIAGNOSIS — C50919 Malignant neoplasm of unspecified site of unspecified female breast: Secondary | ICD-10-CM | POA: Diagnosis not present

## 2018-08-10 ENCOUNTER — Inpatient Hospital Stay: Payer: PRIVATE HEALTH INSURANCE | Attending: Hematology & Oncology

## 2018-08-10 ENCOUNTER — Telehealth: Payer: Self-pay | Admitting: Hematology & Oncology

## 2018-08-10 ENCOUNTER — Encounter: Payer: Self-pay | Admitting: Hematology & Oncology

## 2018-08-10 ENCOUNTER — Inpatient Hospital Stay: Payer: PRIVATE HEALTH INSURANCE

## 2018-08-10 ENCOUNTER — Inpatient Hospital Stay (HOSPITAL_BASED_OUTPATIENT_CLINIC_OR_DEPARTMENT_OTHER): Payer: PRIVATE HEALTH INSURANCE | Admitting: Hematology & Oncology

## 2018-08-10 ENCOUNTER — Other Ambulatory Visit: Payer: Self-pay

## 2018-08-10 VITALS — BP 94/66 | HR 106 | Temp 98.0°F | Resp 16 | Wt 158.0 lb

## 2018-08-10 DIAGNOSIS — C50911 Malignant neoplasm of unspecified site of right female breast: Secondary | ICD-10-CM | POA: Insufficient documentation

## 2018-08-10 DIAGNOSIS — Z171 Estrogen receptor negative status [ER-]: Secondary | ICD-10-CM

## 2018-08-10 DIAGNOSIS — Z5111 Encounter for antineoplastic chemotherapy: Secondary | ICD-10-CM | POA: Insufficient documentation

## 2018-08-10 DIAGNOSIS — Z5112 Encounter for antineoplastic immunotherapy: Secondary | ICD-10-CM | POA: Diagnosis present

## 2018-08-10 DIAGNOSIS — C50919 Malignant neoplasm of unspecified site of unspecified female breast: Secondary | ICD-10-CM

## 2018-08-10 DIAGNOSIS — D500 Iron deficiency anemia secondary to blood loss (chronic): Secondary | ICD-10-CM

## 2018-08-10 DIAGNOSIS — C7951 Secondary malignant neoplasm of bone: Secondary | ICD-10-CM | POA: Insufficient documentation

## 2018-08-10 DIAGNOSIS — D509 Iron deficiency anemia, unspecified: Secondary | ICD-10-CM | POA: Insufficient documentation

## 2018-08-10 DIAGNOSIS — R110 Nausea: Secondary | ICD-10-CM

## 2018-08-10 DIAGNOSIS — C50111 Malignant neoplasm of central portion of right female breast: Secondary | ICD-10-CM

## 2018-08-10 LAB — CBC WITH DIFFERENTIAL (CANCER CENTER ONLY)
Abs Immature Granulocytes: 0.01 10*3/uL (ref 0.00–0.07)
Basophils Absolute: 0 10*3/uL (ref 0.0–0.1)
Basophils Relative: 1 %
Eosinophils Absolute: 0.5 10*3/uL (ref 0.0–0.5)
Eosinophils Relative: 13 %
HCT: 38.2 % (ref 36.0–46.0)
Hemoglobin: 12.8 g/dL (ref 12.0–15.0)
Immature Granulocytes: 0 %
Lymphocytes Relative: 8 %
Lymphs Abs: 0.3 10*3/uL — ABNORMAL LOW (ref 0.7–4.0)
MCH: 30 pg (ref 26.0–34.0)
MCHC: 33.5 g/dL (ref 30.0–36.0)
MCV: 89.5 fL (ref 80.0–100.0)
Monocytes Absolute: 0.5 10*3/uL (ref 0.1–1.0)
Monocytes Relative: 12 %
Neutro Abs: 2.8 10*3/uL (ref 1.7–7.7)
Neutrophils Relative %: 66 %
Platelet Count: 89 10*3/uL — ABNORMAL LOW (ref 150–400)
RBC: 4.27 MIL/uL (ref 3.87–5.11)
RDW: 16.2 % — ABNORMAL HIGH (ref 11.5–15.5)
WBC: 4.3 10*3/uL (ref 4.0–10.5)
nRBC: 0 % (ref 0.0–0.2)

## 2018-08-10 LAB — IRON AND TIBC
Iron: 100 ug/dL (ref 41–142)
Saturation Ratios: 41 % (ref 21–57)
TIBC: 241 ug/dL (ref 236–444)
UIBC: 141 ug/dL (ref 120–384)

## 2018-08-10 LAB — CMP (CANCER CENTER ONLY)
ALT: 25 U/L (ref 0–44)
AST: 40 U/L (ref 15–41)
Albumin: 4.7 g/dL (ref 3.5–5.0)
Alkaline Phosphatase: 63 U/L (ref 38–126)
Anion gap: 11 (ref 5–15)
BUN: 10 mg/dL (ref 6–20)
CALCIUM: 9.6 mg/dL (ref 8.9–10.3)
CO2: 25 mmol/L (ref 22–32)
Chloride: 102 mmol/L (ref 98–111)
Creatinine: 0.55 mg/dL (ref 0.44–1.00)
GFR, Est AFR Am: >60 mL/min (ref >60)
GFR, Estimated: >60 mL/min (ref >60)
Glucose, Bld: 106 mg/dL — ABNORMAL HIGH (ref 70–99)
Potassium: 3.7 mmol/L (ref 3.5–5.1)
SODIUM: 138 mmol/L (ref 135–145)
Total Bilirubin: 0.5 mg/dL (ref 0.3–1.2)
Total Protein: 7.9 g/dL (ref 6.5–8.1)

## 2018-08-10 LAB — FERRITIN: Ferritin: 460 ng/mL — ABNORMAL HIGH (ref 11–307)

## 2018-08-10 LAB — LACTATE DEHYDROGENASE: LDH: 279 U/L — ABNORMAL HIGH (ref 98–192)

## 2018-08-10 MED ORDER — ONDANSETRON HCL 4 MG/2ML IJ SOLN
8.0000 mg | Freq: Once | INTRAMUSCULAR | Status: AC
  Administered 2018-08-10: 8 mg via INTRAVENOUS

## 2018-08-10 MED ORDER — SODIUM CHLORIDE 0.9 % IV SOLN
Freq: Once | INTRAVENOUS | Status: DC
  Filled 2018-08-10: qty 250

## 2018-08-10 MED ORDER — ONDANSETRON HCL 4 MG/2ML IJ SOLN
INTRAMUSCULAR | Status: AC
  Filled 2018-08-10: qty 4

## 2018-08-10 MED ORDER — SODIUM CHLORIDE 0.9 % IV SOLN
1200.0000 mg | Freq: Once | INTRAVENOUS | Status: AC
  Administered 2018-08-10: 1200 mg via INTRAVENOUS
  Filled 2018-08-10: qty 20

## 2018-08-10 MED ORDER — SODIUM CHLORIDE 0.9 % IV SOLN: 8.0000 mg | Freq: Once | INTRAVENOUS | Status: DC

## 2018-08-10 NOTE — Telephone Encounter (Signed)
lmom to inform pt of PET scan 4/2 at 0900 at [facility] and lab/ov 4/7 at 0945 per 3/17 LOS

## 2018-08-10 NOTE — Progress Notes (Signed)
Platelets 89,000.  Heart rate 106.  Ok to treat per dr. Marin Olp.  BP low.  Fluids given.

## 2018-08-10 NOTE — Patient Instructions (Signed)
Atezolizumab injection What is this medicine? ATEZOLIZUMAB (a te zoe LIZ ue mab) is a monoclonal antibody. It is used to treat bladder cancer (urothelial cancer), non-small cell lung cancer, small cell lung cancer, and breast cancer. This medicine may be used for other purposes; ask your health care provider or pharmacist if you have questions. COMMON BRAND NAME(S): Tecentriq What should I tell my health care provider before I take this medicine? They need to know if you have any of these conditions: -diabetes -immune system problems -infection -inflammatory bowel disease -liver disease -lung or breathing disease -lupus -nervous system problems like myasthenia gravis or Guillain-Barre syndrome -organ transplant -an unusual or allergic reaction to atezolizumab, other medicines, foods, dyes, or preservatives -pregnant or trying to get pregnant -breast-feeding How should I use this medicine? This medicine is for infusion into a vein. It is given by a health care professional in a hospital or clinic setting. A special MedGuide will be given to you before each treatment. Be sure to read this information carefully each time. Talk to your pediatrician regarding the use of this medicine in children. Special care may be needed. Overdosage: If you think you have taken too much of this medicine contact a poison control center or emergency room at once. NOTE: This medicine is only for you. Do not share this medicine with others. What if I miss a dose? It is important not to miss your dose. Call your doctor or health care professional if you are unable to keep an appointment. What may interact with this medicine? Interactions have not been studied. This list may not describe all possible interactions. Give your health care provider a list of all the medicines, herbs, non-prescription drugs, or dietary supplements you use. Also tell them if you smoke, drink alcohol, or use illegal drugs. Some items  may interact with your medicine. What should I watch for while using this medicine? Your condition will be monitored carefully while you are receiving this medicine. You may need blood work done while you are taking this medicine. Do not become pregnant while taking this medicine or for at least 5 months after stopping it. Women should inform their doctor if they wish to become pregnant or think they might be pregnant. There is a potential for serious side effects to an unborn child. Talk to your health care professional or pharmacist for more information. Do not breast-feed an infant while taking this medicine or for at least 5 months after the last dose. What side effects may I notice from receiving this medicine? Side effects that you should report to your doctor or health care professional as soon as possible: -allergic reactions like skin rash, itching or hives, swelling of the face, lips, or tongue -black, tarry stools -bloody or watery diarrhea -breathing problems -changes in vision -chest pain or chest tightness -chills -facial flushing -fever -headache -signs and symptoms of high blood sugar such as dizziness; dry mouth; dry skin; fruity breath; nausea; stomach pain; increased hunger or thirst; increased urination -signs and symptoms of liver injury like dark yellow or brown urine; general ill feeling or flu-like symptoms; light-colored stools; loss of appetite; nausea; right upper belly pain; unusually weak or tired; yellowing of the eyes or skin -stomach pain -trouble passing urine or change in the amount of urine Side effects that usually do not require medical attention (report to your doctor or health care professional if they continue or are bothersome): -cough -diarrhea -joint pain -muscle pain -muscle weakness -tiredness -weight loss   This list may not describe all possible side effects. Call your doctor for medical advice about side effects. You may report side effects  to FDA at 1-800-FDA-1088. Where should I keep my medicine? This drug is given in a hospital or clinic and will not be stored at home. NOTE: This sheet is a summary. It may not cover all possible information. If you have questions about this medicine, talk to your doctor, pharmacist, or health care provider.  2019 Elsevier/Gold Standard (2017-08-14 09:33:38)  

## 2018-08-10 NOTE — Progress Notes (Signed)
Hematology and Oncology Follow Up Visit  Sophia [name] [id number] 05/31/1990 28 y.o. 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 (+) - ER-/PR-/HER2+  --  Vertebral mets on 05/17/2018  --  TMB -low;  MSI - stable; PIK3CA - wt  Iron deficiency anemia   Past Therapy: Taxotere/Carboplatin/Herceptin/Perjeta -s/p cycle 4 S/P Bilateral mastectomies - 10/09/2017 Ovarian ablation with Lupron - stopped 11/06/2017 S/p TAH-BSO on 01/19/2018  Current Therapy:   Tecentriq/Avastin -- q 3 wk cycles -- start 06/23/2018 Lynparza (Olapirib)   300 mg po BID -- start 06/23/2018 Radiation Therapy -- back and ribs - start on 07/21/2018 IV Iron as indicated Zometa 4 mg IV q 3 month -- next dose 08/2018   Interim History: Sophia [name] is here today for follow-up.  She is lost a little more weight.  She really had a tough time with the radiation therapy.  I am not sure why it was so tough on her but yet it was difficult.  She lost weight.  She had a lot of nausea with vomiting.  She just did not feel all that well.  She is starting to feel better.  I think it is obvious that the [place] is working.  I really cannot feel any adenopathy in her right neck now.  There is been no fever.  She has had no problems with bowels or bladder.  She is taking no pain medication now.  She has had no rashes.  There is been no bleeding.  We have kept her off Avastin for right now.  Just the Tecentriq with [place]  I suspect that this is working.  We will set her up with a another PET scan in about 2 weeks or so and hopefully we will see that the response is confirmed on PET scan.  She has had no headache.  Her back is feeling better.  The radiation does seem to have helped a little bit.  Currently, her performance status is ECOG 1.     Medications:  Allergies as of 08/10/2018   No Known Allergies     Medication List      Accurate as of August 10, 2018 10:19 AM. Always use your most recent med list.        dronabinol 2.5 MG capsule Commonly known as:  MARINOL Take 1 capsule (2.5 mg total) by mouth 3 (three) times daily before meals.   meloxicam 15 MG tablet Commonly known as:  Mobic Take 1 tablet (15 mg total) by mouth daily.   metoCLOPramide 10 MG tablet Commonly known as:  REGLAN Take 1 tablet (10 mg total) by mouth 3 (three) times daily before meals.   olaparib 150 MG tablet Commonly known as:  LYNPARZA Take 2 tablets (300 mg total) by mouth 2 (two) times daily. Swallow whole.   ondansetron 8 MG disintegrating tablet Commonly known as:  ZOFRAN-ODT Take 1 tablet (8 mg total) by mouth every 6 (six) hours as needed for nausea or vomiting.   senna 8.6 MG Tabs tablet Commonly known as:  SENOKOT Take 1 tablet by mouth daily as needed for mild constipation.   traMADol 50 MG tablet Commonly known as:  ULTRAM Take 1 tablet (50 mg total) by mouth every 6 (six) hours as needed.       Allergies: No Known Allergies  Past Medical History, Surgical history, Social history, and Family History were reviewed and updated.  Review of Systems:  Review of Systems  Constitutional: Negative.   HENT: Negative.   Eyes: Negative.   Respiratory: Negative.   Cardiovascular: Negative.   Gastrointestinal: Negative.   Genitourinary: Negative.   Musculoskeletal: Positive for back pain.  Skin: Negative.   Neurological: Negative.   Endo/Heme/Allergies: Negative.   Psychiatric/Behavioral: Negative.      Physical Exam:  weight is 158 lb (71.7 kg). Her oral temperature is 98 F (36.7 C). Her blood pressure is 94/66 and her pulse is 106 (abnormal). Her respiration is 16 and oxygen saturation is 100%.   Wt Readings from Last 3 Encounters:  08/10/18 158 lb (71.7 kg)  07/19/18 167 lb 1.6 oz (75.8 kg)  07/08/18 174 lb 12.8 oz (79.3 kg)    Physical Exam Vitals signs reviewed.  Constitutional:       Comments: Breast exam shows bilateral mastectomies with implants.  These are well-healed.  There is no erythema.  There is no warmth.  There is no palpable axillary lymph nodes.  She does have some fullness in the right axilla.  HENT:     Head: Normocephalic and atraumatic.  Eyes:     Pupils: Pupils are equal, round, and reactive to light.  Neck:     Musculoskeletal: Normal range of motion.     Comments: She has some improvement in the fullness in the right cervical and supraclavicular region.   Cardiovascular:     Rate and Rhythm: Normal rate and regular rhythm.     Heart sounds: Normal heart sounds.  Pulmonary:     Effort: Pulmonary effort is normal.     Breath sounds: Normal breath sounds.  Abdominal:     General: Bowel sounds are normal.     Palpations: Abdomen is soft.  Musculoskeletal: Normal range of motion.        General: No tenderness or deformity.     Comments: Back exam shows no tenderness over the spine.  There may be some slight spasms in the right lumbar paraspinal muscles.  There is some tenderness along the right flank.  Lymphadenopathy:     Cervical: No cervical adenopathy.  Skin:    General: Skin is warm and dry.     Findings: No erythema or rash.  Neurological:     Mental Status: She is alert and oriented to person, place, and time.  Psychiatric:        Behavior: Behavior normal.        Thought Content: Thought content normal.        Judgment: Judgment normal.      Lab Results  Component Value Date   WBC 4.3 08/10/2018   HGB 12.8 08/10/2018   HCT 38.2 08/10/2018   MCV 89.5 08/10/2018   PLT 89 (L) 08/10/2018   Lab Results  Component Value Date   FERRITIN 567 (H) 05/27/2018   IRON 79 05/27/2018   TIBC 253 05/27/2018   UIBC 174 05/27/2018   IRONPCTSAT 31 05/27/2018   Lab Results  Component Value Date   RBC 4.27 08/10/2018   No results found for: KPAFRELGTCHN, LAMBDASER, KAPLAMBRATIO No results found for: IGGSERUM, IGA, IGMSERUM No results  found for: Odetta Pink, SPEI   Chemistry      Component Value Date/Time   NA 135 07/19/2018 1047   K 3.9 07/19/2018 1047   CL 101 07/19/2018 1047   CO2 25 07/19/2018 1047   BUN 13 07/19/2018 1047   CREATININE 0.84 07/19/2018 1047      Component  Value Date/Time   CALCIUM 10.3 07/19/2018 1047   ALKPHOS 63 07/19/2018 1047   AST 32 07/19/2018 1047   ALT 20 07/19/2018 1047   BILITOT 0.6 07/19/2018 1047       Impression and Plan: Sophia [name] is a very pleasant 28 yo caucasian female with locally advanced stage III ductal carcinoma of the right breast, HER-2- and ER negative. She is BRCA positive.   I will have to set her up with a PET scan to see how everything is looking.  This is clearly going to be necessary I think.  I will see about try to get this set up in another couple weeks.  At some point, we will going to have to get an MRI of her brain.  I think she is at significant risk for brain metastasis.  I am just happy that she is feeling a bit better.  I think the radiation probably has helped with her bone mets.  I still think that the [place] is the critical component of her protocol.  We are still holding on the Avastin.  We will see what her PET scan shows.  If the response is not as I would like, then I will add the Avastin.  Sophia [name] is very complicated.  She is incredibly young.  She has a bad disease.  I just want make sure that we are not aggressive and continue to do the right thing for her.  I spent about 45 minutes with she and her mom.  It was 6 this long as again this is complicated.  It takes a lot of counseling.  I have to coordinate her appointments and x-ray studies and make sure she has her chemotherapy arranged.  Volanda Napoleon, MD 3/17/202010:19 AM

## 2018-08-10 NOTE — Patient Instructions (Signed)

## 2018-08-11 ENCOUNTER — Other Ambulatory Visit: Payer: Self-pay | Admitting: *Deleted

## 2018-08-11 ENCOUNTER — Encounter: Payer: Self-pay | Admitting: Radiation Oncology

## 2018-08-11 DIAGNOSIS — C50111 Malignant neoplasm of central portion of right female breast: Secondary | ICD-10-CM

## 2018-08-11 DIAGNOSIS — Z171 Estrogen receptor negative status [ER-]: Secondary | ICD-10-CM

## 2018-08-11 DIAGNOSIS — C50919 Malignant neoplasm of unspecified site of unspecified female breast: Secondary | ICD-10-CM

## 2018-08-11 DIAGNOSIS — C7951 Secondary malignant neoplasm of bone: Principal | ICD-10-CM

## 2018-08-11 DIAGNOSIS — R1011 Right upper quadrant pain: Secondary | ICD-10-CM

## 2018-08-11 LAB — CANCER ANTIGEN 27.29: CA 27.29: 26.3 U/mL (ref 0.0–38.6)

## 2018-08-11 MED ORDER — ONDANSETRON 8 MG PO TBDP: 8.0000 mg | ORAL_TABLET | Freq: Four times a day (QID) | ORAL | 0 refills | Status: DC | PRN

## 2018-08-11 NOTE — Progress Notes (Signed)
  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 Right ribs; 10 fractions of 3 Gy for a total of 30 Gy          2. Lumbar spine; 14 fractions of 2.5 Gy for a total of 35 Gy          3. Infraclavicular nodes; 14 fractions of 2.5 Gy for a total of 35 Gy  Beams/energy:   1. Photon Isodose; 10X        2. Photon 3D; 15X        3. Photon Isodose; 15X, 10X  Narrative: The patient tolerated radiation treatment relatively well.     At the beginning of treatment, pt reported back pain, N/V, difficulty swallowing, diarrhea (relieved by Immodium), and moderate to severe fatigue. Towards the end of treatment, pt reported improvement to her rib pain but developed SOB with walking. Pt developed skin sensitivity but no redness. Overall the pt was without complaints.   Plan: The patient has completed radiation treatment. The patient will return to radiation oncology clinic for routine followup in one month. I advised them to call or return sooner if they have any questions or concerns related to their recovery or treatment.  -----------------------------------  Blair Promise, PhD, MD  This document serves as a record of services personally performed by Gery Pray, MD. It was created on his behalf by Mary-Margaret Loma Messing, a trained medical scribe. The creation of this record is based on the scribe's personal observations and the provider's statements to them. This document has been checked and approved by the attending provider.

## 2018-08-26 ENCOUNTER — Ambulatory Visit (HOSPITAL_COMMUNITY): Payer: PRIVATE HEALTH INSURANCE

## 2018-08-31 ENCOUNTER — Inpatient Hospital Stay: Payer: PRIVATE HEALTH INSURANCE | Admitting: Hematology & Oncology

## 2018-08-31 ENCOUNTER — Inpatient Hospital Stay: Payer: PRIVATE HEALTH INSURANCE

## 2018-09-01 ENCOUNTER — Encounter: Payer: Self-pay | Admitting: General Practice

## 2018-09-01 NOTE — Progress Notes (Signed)
[facility] Team contacted patient to assess for food insecurity and other psychosocial needs during current COVID19 pandemic.  Unable to reach patient, left VM w request for her to check in and let us know how she is doing.     Beverely Pace, [place]

## 2018-09-03 ENCOUNTER — Telehealth: Payer: Self-pay | Admitting: Hematology & Oncology

## 2018-09-03 NOTE — Telephone Encounter (Signed)
I called and LMVM for patient to call back and reschedule her missed treatment from 4/7

## 2018-09-07 ENCOUNTER — Encounter: Payer: Self-pay | Admitting: *Deleted

## 2018-09-08 ENCOUNTER — Other Ambulatory Visit: Payer: Self-pay | Admitting: Family

## 2018-09-08 ENCOUNTER — Telehealth: Payer: Self-pay

## 2018-09-08 DIAGNOSIS — Z1509 Genetic susceptibility to other malignant neoplasm: Secondary | ICD-10-CM

## 2018-09-08 DIAGNOSIS — Z1501 Genetic susceptibility to malignant neoplasm of breast: Secondary | ICD-10-CM

## 2018-09-08 DIAGNOSIS — C50919 Malignant neoplasm of unspecified site of unspecified female breast: Secondary | ICD-10-CM

## 2018-09-08 DIAGNOSIS — C7951 Secondary malignant neoplasm of bone: Principal | ICD-10-CM

## 2018-09-08 NOTE — Telephone Encounter (Signed)
Contacted pt to determine if pt had any lasting side effects from radiation. Pt states she is doing well and would like to cancel tomorrow's routine one month f/u. Encouraged pt to continue to routinely f/u with Dr. Marin Olp and that she is welcome to contact us in [place] if she would have any further questions/concerns. Pt verbalized understanding and agreement. Loma Sousa, RN BSN

## 2018-09-09 ENCOUNTER — Ambulatory Visit: Payer: PRIVATE HEALTH INSURANCE | Admitting: Radiation Oncology

## 2018-09-09 ENCOUNTER — Encounter: Payer: Self-pay | Admitting: Pharmacist

## 2018-09-10 ENCOUNTER — Encounter (HOSPITAL_COMMUNITY)
Admission: RE | Admit: 2018-09-10 | Discharge: 2018-09-10 | Disposition: A | Discharge status: Home / Self Care | Payer: PRIVATE HEALTH INSURANCE | Source: Ambulatory Visit | Attending: Family | Admitting: Family

## 2018-09-10 ENCOUNTER — Other Ambulatory Visit: Payer: Self-pay

## 2018-09-10 DIAGNOSIS — C7951 Secondary malignant neoplasm of bone: Secondary | ICD-10-CM | POA: Diagnosis present

## 2018-09-10 DIAGNOSIS — Z1501 Genetic susceptibility to malignant neoplasm of breast: Secondary | ICD-10-CM | POA: Insufficient documentation

## 2018-09-10 DIAGNOSIS — Z1509 Genetic susceptibility to other malignant neoplasm: Secondary | ICD-10-CM | POA: Insufficient documentation

## 2018-09-10 DIAGNOSIS — C50919 Malignant neoplasm of unspecified site of unspecified female breast: Secondary | ICD-10-CM | POA: Insufficient documentation

## 2018-09-10 LAB — GLUCOSE, CAPILLARY: Glucose-Capillary: 80 mg/dL (ref 70–99)

## 2018-09-10 MED ORDER — FLUDEOXYGLUCOSE F - 18 (FDG) INJECTION
9.0000 | Freq: Once | INTRAVENOUS | Status: AC
  Administered 2018-09-10: 9 via INTRAVENOUS

## 2018-09-13 ENCOUNTER — Inpatient Hospital Stay: Payer: PRIVATE HEALTH INSURANCE | Attending: Hematology & Oncology

## 2018-09-13 ENCOUNTER — Inpatient Hospital Stay: Payer: PRIVATE HEALTH INSURANCE

## 2018-09-13 ENCOUNTER — Inpatient Hospital Stay (HOSPITAL_BASED_OUTPATIENT_CLINIC_OR_DEPARTMENT_OTHER): Payer: PRIVATE HEALTH INSURANCE | Admitting: Hematology & Oncology

## 2018-09-13 ENCOUNTER — Other Ambulatory Visit: Payer: Self-pay

## 2018-09-13 ENCOUNTER — Encounter: Payer: Self-pay | Admitting: Hematology & Oncology

## 2018-09-13 VITALS — BP 108/77 | HR 88 | Temp 98.3°F | Resp 18 | Wt 157.0 lb

## 2018-09-13 DIAGNOSIS — C50911 Malignant neoplasm of unspecified site of right female breast: Secondary | ICD-10-CM | POA: Diagnosis not present

## 2018-09-13 DIAGNOSIS — C50919 Malignant neoplasm of unspecified site of unspecified female breast: Secondary | ICD-10-CM

## 2018-09-13 DIAGNOSIS — C7951 Secondary malignant neoplasm of bone: Secondary | ICD-10-CM

## 2018-09-13 DIAGNOSIS — Z9013 Acquired absence of bilateral breasts and nipples: Secondary | ICD-10-CM

## 2018-09-13 DIAGNOSIS — C50111 Malignant neoplasm of central portion of right female breast: Secondary | ICD-10-CM

## 2018-09-13 DIAGNOSIS — Z5112 Encounter for antineoplastic immunotherapy: Secondary | ICD-10-CM | POA: Diagnosis not present

## 2018-09-13 DIAGNOSIS — D500 Iron deficiency anemia secondary to blood loss (chronic): Secondary | ICD-10-CM

## 2018-09-13 DIAGNOSIS — Z171 Estrogen receptor negative status [ER-]: Secondary | ICD-10-CM

## 2018-09-13 LAB — CBC WITH DIFFERENTIAL (CANCER CENTER ONLY)
Abs Immature Granulocytes: 0.01 10*3/uL (ref 0.00–0.07)
Basophils Absolute: 0 10*3/uL (ref 0.0–0.1)
Basophils Relative: 0 %
Eosinophils Absolute: 0.2 10*3/uL (ref 0.0–0.5)
Eosinophils Relative: 5 %
HCT: 35.8 % — ABNORMAL LOW (ref 36.0–46.0)
Hemoglobin: 11.8 g/dL — ABNORMAL LOW (ref 12.0–15.0)
Immature Granulocytes: 0 %
Lymphocytes Relative: 13 %
Lymphs Abs: 0.4 10*3/uL — ABNORMAL LOW (ref 0.7–4.0)
MCH: 30.3 pg (ref 26.0–34.0)
MCHC: 33 g/dL (ref 30.0–36.0)
MCV: 92 fL (ref 80.0–100.0)
Monocytes Absolute: 0.4 10*3/uL (ref 0.1–1.0)
Monocytes Relative: 14 %
Neutro Abs: 2 10*3/uL (ref 1.7–7.7)
Neutrophils Relative %: 68 %
Platelet Count: 108 10*3/uL — ABNORMAL LOW (ref 150–400)
RBC: 3.89 MIL/uL (ref 3.87–5.11)
RDW: 15.5 % (ref 11.5–15.5)
WBC Count: 3 10*3/uL — ABNORMAL LOW (ref 4.0–10.5)
nRBC: 0 % (ref 0.0–0.2)

## 2018-09-13 LAB — CMP (CANCER CENTER ONLY)
ALT: 25 U/L (ref 0–44)
AST: 37 U/L (ref 15–41)
Albumin: 4 g/dL (ref 3.5–5.0)
Alkaline Phosphatase: 135 U/L — ABNORMAL HIGH (ref 38–126)
Anion gap: 8 (ref 5–15)
BUN: 8 mg/dL (ref 6–20)
CO2: 26 mmol/L (ref 22–32)
Calcium: 9.9 mg/dL (ref 8.9–10.3)
Chloride: 104 mmol/L (ref 98–111)
Creatinine: 0.58 mg/dL (ref 0.44–1.00)
GFR, Est AFR Am: >60 mL/min (ref >60)
GFR, Estimated: >60 mL/min (ref >60)
Glucose, Bld: 96 mg/dL (ref 70–99)
Potassium: 3.7 mmol/L (ref 3.5–5.1)
Sodium: 138 mmol/L (ref 135–145)
Total Bilirubin: 0.4 mg/dL (ref 0.3–1.2)
Total Protein: 7.4 g/dL (ref 6.5–8.1)

## 2018-09-13 MED ORDER — SODIUM CHLORIDE 0.9 % IV SOLN
1100.0000 mg | Freq: Once | INTRAVENOUS | Status: AC
  Administered 2018-09-13: 12:00:00 1100 mg via INTRAVENOUS
  Filled 2018-09-13: qty 32

## 2018-09-13 MED ORDER — SODIUM CHLORIDE 0.9 % IV SOLN
Freq: Once | INTRAVENOUS | Status: AC
  Administered 2018-09-13: 10:00:00 via INTRAVENOUS
  Filled 2018-09-13: qty 250

## 2018-09-13 MED ORDER — SODIUM CHLORIDE 0.9 % IV SOLN
1200.0000 mg | Freq: Once | INTRAVENOUS | Status: AC
  Administered 2018-09-13: 10:00:00 1200 mg via INTRAVENOUS
  Filled 2018-09-13: qty 20

## 2018-09-13 MED ORDER — SODIUM CHLORIDE 0.9 % IV SOLN: 15.2000 mg/kg | Freq: Once | INTRAVENOUS | Status: DC

## 2018-09-13 MED ORDER — SODIUM CHLORIDE 0.9% FLUSH
10.0000 mL | INTRAVENOUS | Status: DC | PRN
  Administered 2018-09-13: 10 mL
  Filled 2018-09-13: qty 10

## 2018-09-13 MED ORDER — HEPARIN SOD (PORK) LOCK FLUSH 100 UNIT/ML IV SOLN
500.0000 [IU] | Freq: Once | INTRAVENOUS | Status: AC | PRN
  Administered 2018-09-13: 12:00:00 500 [IU]
  Filled 2018-09-13: qty 5

## 2018-09-13 NOTE — Patient Instructions (Signed)
[facility] Discharge Instructions for Patients Receiving Chemotherapy  Today you received the following chemotherapy agents Tecentriq, Avastin  To help prevent nausea and vomiting after your treatment, we encourage you to take your nausea medication    If you develop nausea and vomiting that is not controlled by your nausea medication, call the clinic.   BELOW ARE SYMPTOMS THAT SHOULD BE REPORTED IMMEDIATELY:  *FEVER GREATER THAN 100.5 F  *CHILLS WITH OR WITHOUT FEVER  NAUSEA AND VOMITING THAT IS NOT CONTROLLED WITH YOUR NAUSEA MEDICATION  *UNUSUAL SHORTNESS OF BREATH  *UNUSUAL BRUISING OR BLEEDING  TENDERNESS IN MOUTH AND THROAT WITH OR WITHOUT PRESENCE OF ULCERS  *URINARY PROBLEMS  *BOWEL PROBLEMS  UNUSUAL RASH Items with * indicate a potential emergency and should be followed up as soon as possible.  Feel free to call the clinic should you have any questions or concerns. The clinic phone number is [phone].  Please show the [facility] at check-in to the Emergency Department and triage nurse.

## 2018-09-13 NOTE — Progress Notes (Addendum)
Hematology and Oncology Follow Up Visit  Sophia [name] [id number] [birth year] 28 y.o. 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 (+) - ER-/PR-/HER2-   Vertebral metastases on 05/17/2018  --  TMB -low;  MSI - stable; PIK3CA - wt  Iron deficiency anemia   Past Therapy: Taxotere/Carboplatin/Herceptin/Perjeta -s/p cycle 4 S/P Bilateral mastectomies - 10/09/2017 Ovarian ablation with Lupron - stopped 11/06/2017 S/p TAH-BSO on 01/19/2018  Current Therapy:   Tecentriq/Avastin -- q 3 wk cycles -- start 06/23/2018 Lynparza (Olapirib)   300 mg po BID -- start 06/23/2018 Radiation Therapy -- back and ribs - start on 07/21/2018 IV Iron as indicated Zometa 4 mg IV q 3 month -- next dose 11/2018   Interim History: Sophia [name] is here today for follow-up.  Finally, things are stabilizing for her.  She is now eating better.  The radiation that she took really affected her.  She got through radiation.  We did do a PET scan on her.  This was done on 09/10/2018.  The PET scan showed a very nice response with respect to her adenopathy in the right neck and chest.  She had a marked decrease in SUV.  There was no new disease noted.  She had mixed response of the bones.  I am not sure exactly what this signifies.  She is not hurting.  She is maintaining her weight which is I think a very good idea.  She has been home.  With the coronavirus, she has not been able to go anywhere.  She has had no cough.  She has had no headache.  She has had no change in bowel or bladder habits.  She is doing well with the [place]  She has had no side effects from this.  Home call call 5 currently, her performance status is ECOG 1.     Medications:  Allergies as of 09/13/2018   No Known Allergies     Medication List       Accurate as of September 13, 2018  9:16 AM. Always use your most recent med list.        dronabinol  2.5 MG capsule Commonly known as:  MARINOL Take 1 capsule (2.5 mg total) by mouth 3 (three) times daily before meals.   meloxicam 15 MG tablet Commonly known as:  Mobic Take 1 tablet (15 mg total) by mouth daily.   metoCLOPramide 10 MG tablet Commonly known as:  REGLAN Take 1 tablet (10 mg total) by mouth 3 (three) times daily before meals.   olaparib 150 MG tablet Commonly known as:  LYNPARZA Take 2 tablets (300 mg total) by mouth 2 (two) times daily. Swallow whole.   ondansetron 8 MG disintegrating tablet Commonly known as:  ZOFRAN-ODT Take 1 tablet (8 mg total) by mouth every 6 (six) hours as needed for nausea or vomiting.   senna 8.6 MG Tabs tablet Commonly known as:  SENOKOT Take 1 tablet by mouth daily as needed for mild constipation.   traMADol 50 MG tablet Commonly known as:  ULTRAM Take 1 tablet (50 mg total) by mouth every 6 (six) hours as needed.       Allergies: No Known Allergies  Past Medical History, Surgical history, Social history, and Family History were reviewed and updated.  Review of Systems: Review of Systems  Constitutional: Negative.   HENT: Negative.   Eyes: Negative.   Respiratory: Negative.   Cardiovascular: Negative.  Gastrointestinal: Negative.   Genitourinary: Negative.   Musculoskeletal: Positive for back pain.  Skin: Negative.   Neurological: Negative.   Endo/Heme/Allergies: Negative.   Psychiatric/Behavioral: Negative.      Physical Exam:  weight is 157 lb (71.2 kg). Her temperature is 98.3 F (36.8 C). Her blood pressure is 108/77 and her pulse is 88. Her respiration is 18.   Wt Readings from Last 3 Encounters:  09/13/18 157 lb (71.2 kg)  08/10/18 158 lb (71.7 kg)  07/19/18 167 lb 1.6 oz (75.8 kg)    Physical Exam Vitals signs reviewed.  Constitutional:      Comments: Breast exam shows bilateral mastectomies with implants.  These are well-healed.  There is no erythema.  There is no warmth.  There is no palpable  axillary lymph nodes.  She does have some fullness in the right axilla.  HENT:     Head: Normocephalic and atraumatic.  Eyes:     Pupils: Pupils are equal, round, and reactive to light.  Neck:     Musculoskeletal: Normal range of motion.     Comments: She has some improvement in the fullness in the right cervical and supraclavicular region.   Cardiovascular:     Rate and Rhythm: Normal rate and regular rhythm.     Heart sounds: Normal heart sounds.  Pulmonary:     Effort: Pulmonary effort is normal.     Breath sounds: Normal breath sounds.  Abdominal:     General: Bowel sounds are normal.     Palpations: Abdomen is soft.  Musculoskeletal: Normal range of motion.        General: No tenderness or deformity.     Comments: Back exam shows no tenderness over the spine.  There may be some slight spasms in the right lumbar paraspinal muscles.  There is some tenderness along the right flank.  Lymphadenopathy:     Cervical: No cervical adenopathy.  Skin:    General: Skin is warm and dry.     Findings: No erythema or rash.  Neurological:     Mental Status: She is alert and oriented to person, place, and time.  Psychiatric:        Behavior: Behavior normal.        Thought Content: Thought content normal.        Judgment: Judgment normal.      Lab Results  Component Value Date   WBC 3.0 (L) 09/13/2018   HGB 11.8 (L) 09/13/2018   HCT 35.8 (L) 09/13/2018   MCV 92.0 09/13/2018   PLT 108 (L) 09/13/2018   Lab Results  Component Value Date   FERRITIN 460 (H) 08/10/2018   IRON 100 08/10/2018   TIBC 241 08/10/2018   UIBC 141 08/10/2018   IRONPCTSAT 41 08/10/2018   Lab Results  Component Value Date   RBC 3.89 09/13/2018   No results found for: KPAFRELGTCHN, LAMBDASER, KAPLAMBRATIO No results found for: IGGSERUM, IGA, IGMSERUM No results found for: Ronnald Ramp, A1GS, A2GS, Tillman Sers, SPEI   Chemistry      Component Value Date/Time   NA 138  08/10/2018 0940   K 3.7 08/10/2018 0940   CL 102 08/10/2018 0940   CO2 25 08/10/2018 0940   BUN 10 08/10/2018 0940   CREATININE 0.55 08/10/2018 0940      Component Value Date/Time   CALCIUM 9.6 08/10/2018 0940   ALKPHOS 63 08/10/2018 0940   AST 40 08/10/2018 0940   ALT 25 08/10/2018 0940   BILITOT 0.5  08/10/2018 0940       Impression and Plan: Sophia [name] is a very pleasant 28 yo caucasian female with locally advanced stage III ductal carcinoma of the right breast, HER-2- and ER negative. She is BRCA positive.   At this point, I think we will now add the Avastin.  I think this would be reasonable.  I do not see any reason why we cannot use Avastin on her.  Hopefully, the immunotherapy with Gildardo Pounds is also helping a little bit.  Clearly, the [place] has been the critical component of her protocol.  I am just happy that she is back now.  I know the coronavirus is really affected her life and affected her family.  I spent about 30 minutes with her today.  Again this is complicated given her young age.  We will see her back in 3 weeks.  Y arranged.  Volanda Napoleon, MD 4/20/20209:16 AM

## 2018-09-13 NOTE — Patient Instructions (Signed)
Implanted Port Insertion, Care After  This sheet gives you information about how to care for yourself after your procedure. Your health care provider may also give you more specific instructions. If you have problems or questions, contact your health care provider.  What can I expect after the procedure?  After the procedure, it is common to have:  · Discomfort at the port insertion site.  · Bruising on the skin over the port. This should improve over 3-4 days.  Follow these instructions at home:  Port care  · After your port is placed, you will get a manufacturer's information card. The card has information about your port. Keep this card with you at all times.  · Take care of the port as told by your health care provider. Ask your health care provider if you or a family member can get training for taking care of the port at home. A home health care nurse may also take care of the port.  · Make sure to remember what type of port you have.  Incision care         · Follow instructions from your health care provider about how to take care of your port insertion site. Make sure you:  ? Wash your hands with soap and water before and after you change your bandage (dressing). If soap and water are not available, use hand sanitizer.  ? Change your dressing as told by your health care provider.  ? Leave stitches (sutures), skin glue, or adhesive strips in place. These skin closures may need to stay in place for 2 weeks or longer. If adhesive strip edges start to loosen and curl up, you may trim the loose edges. Do not remove adhesive strips completely unless your health care provider tells you to do that.  · Check your port insertion site every day for signs of infection. Check for:  ? Redness, swelling, or pain.  ? Fluid or blood.  ? Warmth.  ? Pus or a bad smell.  Activity  · Return to your normal activities as told by your health care provider. Ask your health care provider what activities are safe for you.  · Do not  lift anything that is heavier than 10 lb (4.5 kg), or the limit that you are told, until your health care provider says that it is safe.  General instructions  · Take over-the-counter and prescription medicines only as told by your health care provider.  · Do not take baths, swim, or use a hot tub until your health care provider approves. Ask your health care provider if you may take showers. You may only be allowed to take sponge baths.  · Do not drive for 24 hours if you were given a sedative during your procedure.  · Wear a medical alert bracelet in case of an emergency. This will tell any health care providers that you have a port.  · Keep all follow-up visits as told by your health care provider. This is important.  Contact a health care provider if:  · You cannot flush your port with saline as directed, or you cannot draw blood from the port.  · You have a fever or chills.  · You have redness, swelling, or pain around your port insertion site.  · You have fluid or blood coming from your port insertion site.  · Your port insertion site feels warm to the touch.  · You have pus or a bad smell coming from the port   insertion site.  Get help right away if:  · You have chest pain or shortness of breath.  · You have bleeding from your port that you cannot control.  Summary  · Take care of the port as told by your health care provider. Keep the manufacturer's information card with you at all times.  · Change your dressing as told by your health care provider.  · Contact a health care provider if you have a fever or chills or if you have redness, swelling, or pain around your port insertion site.  · Keep all follow-up visits as told by your health care provider.  This information is not intended to replace advice given to you by your health care provider. Make sure you discuss any questions you have with your health care provider.  Document Released: 03/02/2013 Document Revised: 12/08/2017 Document Reviewed:  12/08/2017  Elsevier Interactive Patient Education © 2019 [employer]

## 2018-09-14 ENCOUNTER — Telehealth: Payer: Self-pay | Admitting: Pharmacist

## 2018-09-14 LAB — CANCER ANTIGEN 27.29: CA 27.29: 18.8 U/mL (ref 0.0–38.6)

## 2018-09-14 NOTE — Telephone Encounter (Signed)
Oral Chemotherapy Pharmacist Encounter   [place] has been having trouble reaching Ms. Blanda for for [employer] refill. Last filled on 07/21/18. They have been attempting to reach her since 3/17 and left her many voicemails. I attempted a call last week and again today. Sent patient a my chart message on 09/09/2018. [place], [place] know about the issues reaching Ms. Sarnowski. [place] will inactivated the patient for now. They can reactivate and fill the [place] when she calls.  Darl Pikes, PharmD, BCPS, [facility] Hematology/Oncology Clinical Pharmacist [facility] [phone]  09/14/2018 2:21 PM

## 2018-09-15 ENCOUNTER — Telehealth: Payer: Self-pay | Admitting: *Deleted

## 2018-09-15 ENCOUNTER — Other Ambulatory Visit: Payer: Self-pay | Admitting: Hematology & Oncology

## 2018-09-15 NOTE — Telephone Encounter (Signed)
Received notification from [facility] that patient has not refilled her [place] Her last fill was in February.   Attempting to make verbal contact with patient.  Attempted at 9:50a and message left requesting call back.  Called at 12:20 Patient answered phone. Explained to her that Vickie Epley had been trying to make contact with her. She acknowledged this but did not give reason for why she hasn't called back. She took [name]'s number and stated she would call.

## 2018-09-16 ENCOUNTER — Encounter: Payer: Self-pay | Admitting: Hematology & Oncology

## 2018-10-04 ENCOUNTER — Inpatient Hospital Stay: Payer: PRIVATE HEALTH INSURANCE | Attending: Hematology & Oncology | Admitting: Hematology & Oncology

## 2018-10-04 ENCOUNTER — Encounter: Payer: Self-pay | Admitting: Hematology & Oncology

## 2018-10-04 ENCOUNTER — Inpatient Hospital Stay: Payer: PRIVATE HEALTH INSURANCE

## 2018-10-04 ENCOUNTER — Other Ambulatory Visit: Payer: Self-pay

## 2018-10-04 VITALS — BP 103/72 | HR 69 | Resp 16

## 2018-10-04 VITALS — BP 114/72 | HR 67 | Temp 97.7°F | Resp 18 | Wt 166.0 lb

## 2018-10-04 DIAGNOSIS — C7951 Secondary malignant neoplasm of bone: Secondary | ICD-10-CM

## 2018-10-04 DIAGNOSIS — C50919 Malignant neoplasm of unspecified site of unspecified female breast: Secondary | ICD-10-CM

## 2018-10-04 DIAGNOSIS — N951 Menopausal and female climacteric states: Secondary | ICD-10-CM | POA: Diagnosis not present

## 2018-10-04 DIAGNOSIS — Z5112 Encounter for antineoplastic immunotherapy: Secondary | ICD-10-CM | POA: Insufficient documentation

## 2018-10-04 DIAGNOSIS — C50911 Malignant neoplasm of unspecified site of right female breast: Secondary | ICD-10-CM | POA: Insufficient documentation

## 2018-10-04 DIAGNOSIS — C50111 Malignant neoplasm of central portion of right female breast: Secondary | ICD-10-CM

## 2018-10-04 DIAGNOSIS — D509 Iron deficiency anemia, unspecified: Secondary | ICD-10-CM | POA: Diagnosis not present

## 2018-10-04 DIAGNOSIS — Z171 Estrogen receptor negative status [ER-]: Secondary | ICD-10-CM

## 2018-10-04 DIAGNOSIS — D500 Iron deficiency anemia secondary to blood loss (chronic): Secondary | ICD-10-CM

## 2018-10-04 LAB — CBC WITH DIFFERENTIAL (CANCER CENTER ONLY)
Abs Immature Granulocytes: 0.01 10*3/uL (ref 0.00–0.07)
Basophils Absolute: 0 10*3/uL (ref 0.0–0.1)
Basophils Relative: 1 %
Eosinophils Absolute: 0.5 10*3/uL (ref 0.0–0.5)
Eosinophils Relative: 12 %
HCT: 36.7 % (ref 36.0–46.0)
Hemoglobin: 12.3 g/dL (ref 12.0–15.0)
Immature Granulocytes: 0 %
Lymphocytes Relative: 13 %
Lymphs Abs: 0.5 10*3/uL — ABNORMAL LOW (ref 0.7–4.0)
MCH: 31.5 pg (ref 26.0–34.0)
MCHC: 33.5 g/dL (ref 30.0–36.0)
MCV: 93.9 fL (ref 80.0–100.0)
Monocytes Absolute: 0.5 10*3/uL (ref 0.1–1.0)
Monocytes Relative: 12 %
Neutro Abs: 2.5 10*3/uL (ref 1.7–7.7)
Neutrophils Relative %: 62 %
Platelet Count: 106 10*3/uL — ABNORMAL LOW (ref 150–400)
RBC: 3.91 MIL/uL (ref 3.87–5.11)
RDW: 15.1 % (ref 11.5–15.5)
WBC Count: 4 10*3/uL (ref 4.0–10.5)
nRBC: 0 % (ref 0.0–0.2)

## 2018-10-04 LAB — CMP (CANCER CENTER ONLY)
ALT: 26 U/L (ref 0–44)
AST: 37 U/L (ref 15–41)
Albumin: 3.9 g/dL (ref 3.5–5.0)
Alkaline Phosphatase: 154 U/L — ABNORMAL HIGH (ref 38–126)
Anion gap: 9 (ref 5–15)
BUN: 11 mg/dL (ref 6–20)
CO2: 24 mmol/L (ref 22–32)
Calcium: 9.6 mg/dL (ref 8.9–10.3)
Chloride: 103 mmol/L (ref 98–111)
Creatinine: 0.63 mg/dL (ref 0.44–1.00)
GFR, Est AFR Am: >60 mL/min (ref >60)
GFR, Estimated: >60 mL/min (ref >60)
Glucose, Bld: 116 mg/dL — ABNORMAL HIGH (ref 70–99)
Potassium: 3.7 mmol/L (ref 3.5–5.1)
Sodium: 136 mmol/L (ref 135–145)
Total Bilirubin: 0.4 mg/dL (ref 0.3–1.2)
Total Protein: 7.1 g/dL (ref 6.5–8.1)

## 2018-10-04 MED ORDER — SODIUM CHLORIDE 0.9 % IV SOLN
15.0000 mg/kg | Freq: Once | INTRAVENOUS | Status: AC
  Administered 2018-10-04: 11:00:00 1100 mg via INTRAVENOUS
  Filled 2018-10-04: qty 32

## 2018-10-04 MED ORDER — SODIUM CHLORIDE 0.9% FLUSH
10.0000 mL | INTRAVENOUS | Status: DC | PRN
  Administered 2018-10-04: 10 mL
  Filled 2018-10-04: qty 10

## 2018-10-04 MED ORDER — HEPARIN SOD (PORK) LOCK FLUSH 100 UNIT/ML IV SOLN
500.0000 [IU] | Freq: Once | INTRAVENOUS | Status: AC | PRN
  Administered 2018-10-04: 12:00:00 500 [IU]
  Filled 2018-10-04: qty 5

## 2018-10-04 MED ORDER — SODIUM CHLORIDE 0.9 % IV SOLN
Freq: Once | INTRAVENOUS | Status: AC
  Administered 2018-10-04: 10:00:00 via INTRAVENOUS
  Filled 2018-10-04: qty 250

## 2018-10-04 MED ORDER — SODIUM CHLORIDE 0.9% FLUSH
10.0000 mL | Freq: Once | INTRAVENOUS | Status: AC
  Administered 2018-10-04: 10 mL
  Filled 2018-10-04: qty 10

## 2018-10-04 MED ORDER — SODIUM CHLORIDE 0.9 % IV SOLN
1200.0000 mg | Freq: Once | INTRAVENOUS | Status: AC
  Administered 2018-10-04: 10:00:00 1200 mg via INTRAVENOUS
  Filled 2018-10-04: qty 20

## 2018-10-04 NOTE — Patient Instructions (Signed)

## 2018-10-04 NOTE — Patient Instructions (Signed)
Bevacizumab injection  What is this medicine?  BEVACIZUMAB (be va SIZ yoo mab) is a monoclonal antibody. It is used to treat many types of cancer.  This medicine may be used for other purposes; ask your health care provider or pharmacist if you have questions.  COMMON BRAND NAME(S): Avastin, MVASI  What should I tell my health care provider before I take this medicine?  They need to know if you have any of these conditions:  -diabetes  -heart disease  -high blood pressure  -history of coughing up blood  -prior anthracycline chemotherapy (e.g., doxorubicin, daunorubicin, epirubicin)  -recent or ongoing radiation therapy  -recent or planning to have surgery  -stroke  -an unusual or allergic reaction to bevacizumab, hamster proteins, mouse proteins, other medicines, foods, dyes, or preservatives  -pregnant or trying to get pregnant  -breast-feeding  How should I use this medicine?  This medicine is for infusion into a vein. It is given by a health care professional in a hospital or clinic setting.  Talk to your pediatrician regarding the use of this medicine in children. Special care may be needed.  Overdosage: If you think you have taken too much of this medicine contact a poison control center or emergency room at once.  NOTE: This medicine is only for you. Do not share this medicine with others.  What if I miss a dose?  It is important not to miss your dose. Call your doctor or health care professional if you are unable to keep an appointment.  What may interact with this medicine?  Interactions are not expected.  This list may not describe all possible interactions. Give your health care provider a list of all the medicines, herbs, non-prescription drugs, or dietary supplements you use. Also tell them if you smoke, drink alcohol, or use illegal drugs. Some items may interact with your medicine.  What should I watch for while using this medicine?  Your condition will be monitored carefully while you are receiving  this medicine. You will need important blood work and urine testing done while you are taking this medicine.  This medicine may increase your risk to bruise or bleed. Call your doctor or health care professional if you notice any unusual bleeding.  This medicine should be started at least 28 days following major surgery and the site of the surgery should be totally healed. Check with your doctor before scheduling dental work or surgery while you are receiving this treatment. Talk to your doctor if you have recently had surgery or if you have a wound that has not healed.  Do not become pregnant while taking this medicine or for 6 months after stopping it. Women should inform their doctor if they wish to become pregnant or think they might be pregnant. There is a potential for serious side effects to an unborn child. Talk to your health care professional or pharmacist for more information. Do not breast-feed an infant while taking this medicine and for 6 months after the last dose.  This medicine has caused ovarian failure in some women. This medicine may interfere with the ability to have a child. You should talk to your doctor or health care professional if you are concerned about your fertility.  What side effects may I notice from receiving this medicine?  Side effects that you should report to your doctor or health care professional as soon as possible:  -allergic reactions like skin rash, itching or hives, swelling of the face, lips, or tongue  -  chest pain or chest tightness  -chills  -coughing up blood  -high fever  -seizures  -severe constipation  -signs and symptoms of bleeding such as bloody or black, tarry stools; red or dark-brown urine; spitting up blood or brown material that looks like coffee grounds; red spots on the skin; unusual bruising or bleeding from the eye, gums, or nose  -signs and symptoms of a blood clot such as breathing problems; chest pain; severe, sudden headache; pain, swelling, warmth  in the leg  -signs and symptoms of a stroke like changes in vision; confusion; trouble speaking or understanding; severe headaches; sudden numbness or weakness of the face, arm or leg; trouble walking; dizziness; loss of balance or coordination  -stomach pain  -sweating  -swelling of legs or ankles  -vomiting  -weight gain  Side effects that usually do not require medical attention (report to your doctor or health care professional if they continue or are bothersome):  -back pain  -changes in taste  -decreased appetite  -dry skin  -nausea  -tiredness  This list may not describe all possible side effects. Call your doctor for medical advice about side effects. You may report side effects to FDA at 1-800-FDA-1088.  Where should I keep my medicine?  This drug is given in a hospital or clinic and will not be stored at home.  NOTE: This sheet is a summary. It may not cover all possible information. If you have questions about this medicine, talk to your doctor, pharmacist, or health care provider.   2019 Elsevier/Gold Standard (2016-05-09 14:33:29)      Atezolizumab injection  What is this medicine?  ATEZOLIZUMAB (a te zoe LIZ ue mab) is a monoclonal antibody. It is used to treat bladder cancer (urothelial cancer), non-small cell lung cancer, small cell lung cancer, and breast cancer.  This medicine may be used for other purposes; ask your health care provider or pharmacist if you have questions.  COMMON BRAND NAME(S): Tecentriq  What should I tell my health care provider before I take this medicine?  They need to know if you have any of these conditions:  -diabetes  -immune system problems  -infection  -inflammatory bowel disease  -liver disease  -lung or breathing disease  -lupus  -nervous system problems like myasthenia gravis or Guillain-Barre syndrome  -organ transplant  -an unusual or allergic reaction to atezolizumab, other medicines, foods, dyes, or preservatives  -pregnant or trying to get  pregnant  -breast-feeding  How should I use this medicine?  This medicine is for infusion into a vein. It is given by a health care professional in a hospital or clinic setting.  A special MedGuide will be given to you before each treatment. Be sure to read this information carefully each time.  Talk to your pediatrician regarding the use of this medicine in children. Special care may be needed.  Overdosage: If you think you have taken too much of this medicine contact a poison control center or emergency room at once.  NOTE: This medicine is only for you. Do not share this medicine with others.  What if I miss a dose?  It is important not to miss your dose. Call your doctor or health care professional if you are unable to keep an appointment.  What may interact with this medicine?  Interactions have not been studied.  This list may not describe all possible interactions. Give your health care provider a list of all the medicines, herbs, non-prescription drugs, or dietary supplements you   use. Also tell them if you smoke, drink alcohol, or use illegal drugs. Some items may interact with your medicine.  What should I watch for while using this medicine?  Your condition will be monitored carefully while you are receiving this medicine.  You may need blood work done while you are taking this medicine.  Do not become pregnant while taking this medicine or for at least 5 months after stopping it. Women should inform their doctor if they wish to become pregnant or think they might be pregnant. There is a potential for serious side effects to an unborn child. Talk to your health care professional or pharmacist for more information. Do not breast-feed an infant while taking this medicine or for at least 5 months after the last dose.  What side effects may I notice from receiving this medicine?  Side effects that you should report to your doctor or health care professional as soon as possible:  -allergic reactions like skin  rash, itching or hives, swelling of the face, lips, or tongue  -black, tarry stools  -bloody or watery diarrhea  -breathing problems  -changes in vision  -chest pain or chest tightness  -chills  -facial flushing  -fever  -headache  -signs and symptoms of high blood sugar such as dizziness; dry mouth; dry skin; fruity breath; nausea; stomach pain; increased hunger or thirst; increased urination  -signs and symptoms of liver injury like dark yellow or brown urine; general ill feeling or flu-like symptoms; light-colored stools; loss of appetite; nausea; right upper belly pain; unusually weak or tired; yellowing of the eyes or skin  -stomach pain  -trouble passing urine or change in the amount of urine  Side effects that usually do not require medical attention (report to your doctor or health care professional if they continue or are bothersome):  -cough  -diarrhea  -joint pain  -muscle pain  -muscle weakness  -tiredness  -weight loss  This list may not describe all possible side effects. Call your doctor for medical advice about side effects. You may report side effects to FDA at 1-800-FDA-1088.  Where should I keep my medicine?  This drug is given in a hospital or clinic and will not be stored at home.  NOTE: This sheet is a summary. It may not cover all possible information. If you have questions about this medicine, talk to your doctor, pharmacist, or health care provider.   2019 Elsevier/Gold Standard (2017-08-14 09:33:38)

## 2018-10-04 NOTE — Progress Notes (Signed)
Patient refuses Zometa. Patient states he had severe bone pain.

## 2018-10-04 NOTE — Progress Notes (Signed)
Hematology and Oncology Follow Up Visit  Sophia [name] [id number] 11-25-1990 28 y.o. 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 (+) - ER-/PR-/HER2-   Vertebral metastases on 05/17/2018  --  TMB -low;  MSI - stable; PIK3CA - wt  Iron deficiency anemia   Past Therapy: Taxotere/Carboplatin/Herceptin/Perjeta -s/p cycle 4 S/P Bilateral mastectomies - 10/09/2017 Ovarian ablation with Lupron - stopped 11/06/2017 S/p TAH-BSO on 01/19/2018  Current Therapy:   Tecentriq/Avastin -- q 3 wk cycles --s/p cycle #4 - start 06/23/2018 Sophia [name] (Olapirib)   300 mg po BID -- start 06/23/2018 Radiation Therapy -- back and ribs - start on 07/21/2018 IV Iron as indicated Zometa 4 mg IV q 3 month -- next dose 11/2018   Interim History: Sophia [name] is here today for follow-up.  She really looks fantastic.  She had a wonderful Mother's Day weekend.  She enjoyed herself.  She is eating quite well.  She does not have any problems with pain.  There is no cough or shortness of breath.  She is taking the olaparib.  She is taking it twice a day now.  Her last CA 27.29 was 18.8 which is low but better than before.  There is no leg swelling.  She is had no difficulties.  She is post menopausal.  She has had her ovaries.  She does have some hot flashes.  Overall, her performance status is ECOG 0.  Medications:  Allergies as of 10/04/2018   No Known Allergies     Medication List       Accurate as of Oct 04, 2018  9:18 AM. If you have any questions, ask your nurse or doctor.        STOP taking these medications   dronabinol 2.5 MG capsule Commonly known as:  MARINOL Stopped by:  Sophia [name]   meloxicam 15 MG tablet Commonly known as:  Mobic Stopped by:  Sophia [name]   metoCLOPramide 10 MG tablet Commonly known as:  REGLAN Stopped by:  Sophia [name]   ondansetron 8 MG disintegrating  tablet Commonly known as:  ZOFRAN-ODT Stopped by:  Sophia [name]   senna 8.6 MG Tabs tablet Commonly known as:  SENOKOT Stopped by:  Sophia [name]   traMADol 50 MG tablet Commonly known as:  ULTRAM Stopped by:  Sophia [name]     TAKE these medications   olaparib 150 MG tablet Commonly known as:  LYNPARZA Take 2 tablets (300 mg total) by mouth 2 (two) times daily. Swallow whole.       Allergies: No Known Allergies  Past Medical History, Surgical history, Social history, and Family History were reviewed and updated.  Review of Systems: Review of Systems  Constitutional: Negative.   HENT: Negative.   Eyes: Negative.   Respiratory: Negative.   Cardiovascular: Negative.   Gastrointestinal: Negative.   Genitourinary: Negative.   Musculoskeletal: Positive for back pain.  Skin: Negative.   Neurological: Negative.   Endo/Heme/Allergies: Negative.   Psychiatric/Behavioral: Negative.      Physical Exam:  weight is 166 lb (75.3 kg). Her oral temperature is 97.7 F (36.5 C). Her blood pressure is 114/72 and her pulse is 67. Her respiration is 18 and oxygen saturation is 100%.   Wt Readings from Last 3 Encounters:  10/04/18 166 lb (75.3 kg)  09/13/18 157 lb (71.2 kg)  08/10/18 158 lb (71.7 kg)  Physical Exam Vitals signs reviewed.  Constitutional:      Comments: Breast exam shows bilateral mastectomies with implants.  These are well-healed.  There is no erythema.  There is no warmth.  There is no palpable axillary lymph nodes.  She does have some fullness in the right axilla.  HENT:     Head: Normocephalic and atraumatic.  Eyes:     Pupils: Pupils are equal, round, and reactive to light.  Neck:     Musculoskeletal: Normal range of motion.     Comments: She has some improvement in the fullness in the right cervical and supraclavicular region.   Cardiovascular:     Rate and Rhythm: Normal rate and regular rhythm.     Heart sounds: Normal heart  sounds.  Pulmonary:     Effort: Pulmonary effort is normal.     Breath sounds: Normal breath sounds.  Abdominal:     General: Bowel sounds are normal.     Palpations: Abdomen is soft.  Musculoskeletal: Normal range of motion.        General: No tenderness or deformity.     Comments: Back exam shows no tenderness over the spine.  There may be some slight spasms in the right lumbar paraspinal muscles.  There is some tenderness along the right flank.  Lymphadenopathy:     Cervical: No cervical adenopathy.  Skin:    General: Skin is warm and dry.     Findings: No erythema or rash.  Neurological:     Mental Status: She is alert and oriented to person, place, and time.  Psychiatric:        Behavior: Behavior normal.        Thought Content: Thought content normal.        Judgment: Judgment normal.      Lab Results  Component Value Date   WBC 4.0 10/04/2018   HGB 12.3 10/04/2018   HCT 36.7 10/04/2018   MCV 93.9 10/04/2018   PLT 106 (L) 10/04/2018   Lab Results  Component Value Date   FERRITIN 460 (H) 08/10/2018   IRON 100 08/10/2018   TIBC 241 08/10/2018   UIBC 141 08/10/2018   IRONPCTSAT 41 08/10/2018   Lab Results  Component Value Date   RBC 3.91 10/04/2018   No results found for: KPAFRELGTCHN, LAMBDASER, KAPLAMBRATIO No results found for: IGGSERUM, IGA, IGMSERUM No results found for: Ronnald Ramp, A1GS, A2GS, Violet Baldy, MSPIKE, SPEI   Chemistry      Component Value Date/Time   NA 136 10/04/2018 0830   K 3.7 10/04/2018 0830   CL 103 10/04/2018 0830   CO2 24 10/04/2018 0830   BUN 11 10/04/2018 0830   CREATININE 0.63 10/04/2018 0830      Component Value Date/Time   CALCIUM 9.6 10/04/2018 0830   ALKPHOS 154 (H) 10/04/2018 0830   AST 37 10/04/2018 0830   ALT 26 10/04/2018 0830   BILITOT 0.4 10/04/2018 0830       Impression and Plan: Sophia [name] is a very pleasant 28 yo caucasian female with locally advanced stage III ductal carcinoma of  the right breast, HER-2- and ER negative. She is BRCA positive.   I still feel that has been the therapy.  I really believe that she is responding well to the olaparib given that she is BRCA positive.  I really cannot feel any adenopathy in the right neck.  I cannot feel any fullness in the right axilla.  We probably are not another PET  scan until June or July.  Her back here to see [place] in another 3 weeks.  Happy that her quality of life is finally doing well.  Her weight that also is a good sign.    Sophia [name] 5/11/20209:18 AM

## 2018-10-05 LAB — IRON AND TIBC
Iron: 74 ug/dL (ref 41–142)
Saturation Ratios: 31 % (ref 21–57)
TIBC: 239 ug/dL (ref 236–444)
UIBC: 165 ug/dL (ref 120–384)

## 2018-10-05 LAB — CANCER ANTIGEN 27.29: CA 27.29: 21.1 U/mL (ref 0.0–38.6)

## 2018-10-05 LAB — FERRITIN: Ferritin: 252 ng/mL (ref 11–307)

## 2018-10-08 ENCOUNTER — Telehealth: Payer: Self-pay | Admitting: *Deleted

## 2018-10-08 ENCOUNTER — Encounter: Payer: Self-pay | Admitting: *Deleted

## 2018-10-08 NOTE — Telephone Encounter (Signed)
Message left for patient to call office back regarding Lynparza.  Message received from A. Hollice Espy, Pharmacist to Dr. Marin Olp that patient has not picked up her prescription for Lynparza.  MyChart message also sent to patient.

## 2018-10-25 ENCOUNTER — Inpatient Hospital Stay: Payer: PRIVATE HEALTH INSURANCE

## 2018-10-25 ENCOUNTER — Other Ambulatory Visit: Payer: Self-pay

## 2018-10-25 ENCOUNTER — Inpatient Hospital Stay: Payer: PRIVATE HEALTH INSURANCE | Attending: Hematology & Oncology | Admitting: Hematology & Oncology

## 2018-10-25 ENCOUNTER — Telehealth: Payer: Self-pay | Admitting: Hematology & Oncology

## 2018-10-25 ENCOUNTER — Encounter: Payer: Self-pay | Admitting: Hematology & Oncology

## 2018-10-25 VITALS — BP 110/84 | HR 78 | Temp 97.7°F | Resp 16 | Wt 165.0 lb

## 2018-10-25 DIAGNOSIS — C50111 Malignant neoplasm of central portion of right female breast: Secondary | ICD-10-CM

## 2018-10-25 DIAGNOSIS — C50919 Malignant neoplasm of unspecified site of unspecified female breast: Secondary | ICD-10-CM

## 2018-10-25 DIAGNOSIS — Z79899 Other long term (current) drug therapy: Secondary | ICD-10-CM | POA: Insufficient documentation

## 2018-10-25 DIAGNOSIS — Z5112 Encounter for antineoplastic immunotherapy: Secondary | ICD-10-CM | POA: Insufficient documentation

## 2018-10-25 DIAGNOSIS — C50911 Malignant neoplasm of unspecified site of right female breast: Secondary | ICD-10-CM

## 2018-10-25 DIAGNOSIS — Z171 Estrogen receptor negative status [ER-]: Secondary | ICD-10-CM

## 2018-10-25 DIAGNOSIS — C7951 Secondary malignant neoplasm of bone: Secondary | ICD-10-CM

## 2018-10-25 LAB — CBC WITH DIFFERENTIAL (CANCER CENTER ONLY)
Abs Immature Granulocytes: 0.02 10*3/uL (ref 0.00–0.07)
Basophils Absolute: 0 10*3/uL (ref 0.0–0.1)
Basophils Relative: 0 %
Eosinophils Absolute: 0.6 10*3/uL — ABNORMAL HIGH (ref 0.0–0.5)
Eosinophils Relative: 12 %
HCT: 38.9 % (ref 36.0–46.0)
Hemoglobin: 13 g/dL (ref 12.0–15.0)
Immature Granulocytes: 0 %
Lymphocytes Relative: 12 %
Lymphs Abs: 0.6 10*3/uL — ABNORMAL LOW (ref 0.7–4.0)
MCH: 31.9 pg (ref 26.0–34.0)
MCHC: 33.4 g/dL (ref 30.0–36.0)
MCV: 95.3 fL (ref 80.0–100.0)
Monocytes Absolute: 0.4 10*3/uL (ref 0.1–1.0)
Monocytes Relative: 9 %
Neutro Abs: 3.2 10*3/uL (ref 1.7–7.7)
Neutrophils Relative %: 67 %
Platelet Count: 111 10*3/uL — ABNORMAL LOW (ref 150–400)
RBC: 4.08 MIL/uL (ref 3.87–5.11)
RDW: 13.8 % (ref 11.5–15.5)
WBC Count: 4.7 10*3/uL (ref 4.0–10.5)
nRBC: 0 % (ref 0.0–0.2)

## 2018-10-25 LAB — CMP (CANCER CENTER ONLY)
ALT: 35 U/L (ref 0–44)
AST: 40 U/L (ref 15–41)
Albumin: 4.1 g/dL (ref 3.5–5.0)
Alkaline Phosphatase: 152 U/L — ABNORMAL HIGH (ref 38–126)
Anion gap: 9 (ref 5–15)
BUN: 14 mg/dL (ref 6–20)
CO2: 25 mmol/L (ref 22–32)
Calcium: 9.4 mg/dL (ref 8.9–10.3)
Chloride: 102 mmol/L (ref 98–111)
Creatinine: 0.82 mg/dL (ref 0.44–1.00)
GFR, Est AFR Am: >60 mL/min (ref >60)
GFR, Estimated: >60 mL/min (ref >60)
Glucose, Bld: 125 mg/dL — ABNORMAL HIGH (ref 70–99)
Potassium: 3.9 mmol/L (ref 3.5–5.1)
Sodium: 136 mmol/L (ref 135–145)
Total Bilirubin: 0.7 mg/dL (ref 0.3–1.2)
Total Protein: 7.7 g/dL (ref 6.5–8.1)

## 2018-10-25 LAB — TOTAL PROTEIN, URINE DIPSTICK: Protein, ur: NEGATIVE mg/dL

## 2018-10-25 MED ORDER — SODIUM CHLORIDE 0.9 % IV SOLN
Freq: Once | INTRAVENOUS | Status: AC
  Administered 2018-10-25: 11:00:00 via INTRAVENOUS
  Filled 2018-10-25: qty 250

## 2018-10-25 MED ORDER — SODIUM CHLORIDE 0.9 % IV SOLN
15.0000 mg/kg | Freq: Once | INTRAVENOUS | Status: AC
  Administered 2018-10-25: 13:00:00 1100 mg via INTRAVENOUS
  Filled 2018-10-25: qty 32

## 2018-10-25 MED ORDER — HEPARIN SOD (PORK) LOCK FLUSH 100 UNIT/ML IV SOLN
500.0000 [IU] | Freq: Once | INTRAVENOUS | Status: AC | PRN
  Administered 2018-10-25: 500 [IU]
  Filled 2018-10-25: qty 5

## 2018-10-25 MED ORDER — SODIUM CHLORIDE 0.9% FLUSH
10.0000 mL | INTRAVENOUS | Status: DC | PRN
  Administered 2018-10-25: 10 mL
  Filled 2018-10-25: qty 10

## 2018-10-25 MED ORDER — SODIUM CHLORIDE 0.9 % IV SOLN
1200.0000 mg | Freq: Once | INTRAVENOUS | Status: AC
  Administered 2018-10-25: 1200 mg via INTRAVENOUS
  Filled 2018-10-25: qty 20

## 2018-10-25 NOTE — Telephone Encounter (Signed)
Appointments scheduled previously per 6/1 los

## 2018-10-25 NOTE — Progress Notes (Signed)
Hematology and Oncology Follow Up Visit  Breane Grunwald [id number] [birth year] 28 y.o. 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 - BRCA (+) - ER-/PR-/HER2-   Vertebral metastases on 05/17/2018  --  TMB -low;  MSI - stable; PIK3CA - wt  Iron deficiency anemia   Past Therapy: Taxotere/Carboplatin/Herceptin/Perjeta -s/p cycle 4 S/P Bilateral mastectomies - 10/09/2017 Ovarian ablation with Lupron - stopped 11/06/2017 S/p TAH-BSO on 01/19/2018  Current Therapy:   Tecentriq/Avastin -- q 3 wk cycles --s/p cycle #5 - start 06/23/2018 Lonie Peak (Olapirib)   300 mg po BID -- start 06/23/2018 Radiation Therapy -- back and ribs - start on 07/21/2018 IV Iron as indicated Zometa 4 mg IV q 3 month -- next dose 11/2018   Interim History: Ms. Preisler is here today for follow-up.  She is doing incredibly well.  She really looks good.  She feels good.  She is really, really looking forward to the family beach trip at the end of June.  About 20 family members are handed out to the [employer] for a week.  I think it is wonderful that she will be able to go.  She now has a new guy.  He really is a good [place].  He really takes good care of her.  I am so happy that she feels well enough to be able to spend time with somebody else.  She is doing the olaparib.  She is not have any problems with this.  The Tecentriq/Avastin is doing well for her.  Blood pressure has not been an issue today.  Her last CA 27.29 was stable at 21.  There is been no problems with bleeding.  She has had no cough or shortness of breath.  No leg swelling.    Overall, her performance status is ECOG 0.  Medications:  Allergies as of 10/25/2018   No Known Allergies     Medication List       Accurate as of October 25, 2018 10:25 AM. If you have any questions, ask your nurse or doctor.        olaparib 150 MG tablet Commonly known as:  LYNPARZA  Take 2 tablets (300 mg total) by mouth 2 (two) times daily. Swallow whole.       Allergies: No Known Allergies  Past Medical History, Surgical history, Social history, and Family History were reviewed and updated.  Review of Systems: Review of Systems  Constitutional: Negative.   HENT: Negative.   Eyes: Negative.   Respiratory: Negative.   Cardiovascular: Negative.   Gastrointestinal: Negative.   Genitourinary: Negative.   Musculoskeletal: Positive for back pain.  Skin: Negative.   Neurological: Negative.   Endo/Heme/Allergies: Negative.   Psychiatric/Behavioral: Negative.      Physical Exam:  weight is 165 lb (74.8 kg). Her oral temperature is 97.7 F (36.5 C). Her blood pressure is 110/84 and her pulse is 78. Her respiration is 16.   Wt Readings from Last 3 Encounters:  10/25/18 165 lb (74.8 kg)  10/04/18 166 lb (75.3 kg)  09/13/18 157 lb (71.2 kg)    Physical Exam Vitals signs reviewed.  Constitutional:      Comments: Breast exam shows bilateral mastectomies with implants.  These are well-healed.  There is no erythema.  There is no warmth.  There is no palpable axillary lymph nodes.  She does have some fullness in the right axilla.  HENT:     Head: Normocephalic and  atraumatic.  Eyes:     Pupils: Pupils are equal, round, and reactive to light.  Neck:     Musculoskeletal: Normal range of motion.     Comments: She has some improvement in the fullness in the right cervical and supraclavicular region.   Cardiovascular:     Rate and Rhythm: Normal rate and regular rhythm.     Heart sounds: Normal heart sounds.  Pulmonary:     Effort: Pulmonary effort is normal.     Breath sounds: Normal breath sounds.  Abdominal:     General: Bowel sounds are normal.     Palpations: Abdomen is soft.  Musculoskeletal: Normal range of motion.        General: No tenderness or deformity.     Comments: Back exam shows no tenderness over the spine.  There may be some slight spasms  in the right lumbar paraspinal muscles.  There is some tenderness along the right flank.  Lymphadenopathy:     Cervical: No cervical adenopathy.  Skin:    General: Skin is warm and dry.     Findings: No erythema or rash.  Neurological:     Mental Status: She is alert and oriented to person, place, and time.  Psychiatric:        Behavior: Behavior normal.        Thought Content: Thought content normal.        Judgment: Judgment normal.      Lab Results  Component Value Date   WBC 4.7 10/25/2018   HGB 13.0 10/25/2018   HCT 38.9 10/25/2018   MCV 95.3 10/25/2018   PLT 111 (L) 10/25/2018   Lab Results  Component Value Date   FERRITIN 252 10/04/2018   IRON 74 10/04/2018   TIBC 239 10/04/2018   UIBC 165 10/04/2018   IRONPCTSAT 31 10/04/2018   Lab Results  Component Value Date   RBC 4.08 10/25/2018   No results found for: KPAFRELGTCHN, LAMBDASER, KAPLAMBRATIO No results found for: IGGSERUM, IGA, IGMSERUM No results found for: Kathrynn Ducking, MSPIKE, SPEI   Chemistry      Component Value Date/Time   NA 136 10/25/2018 0930   K 3.9 10/25/2018 0930   CL 102 10/25/2018 0930   CO2 25 10/25/2018 0930   BUN 14 10/25/2018 0930   CREATININE 0.82 10/25/2018 0930      Component Value Date/Time   CALCIUM 9.4 10/25/2018 0930   ALKPHOS 152 (H) 10/25/2018 0930   AST 40 10/25/2018 0930   ALT 35 10/25/2018 0930   BILITOT 0.7 10/25/2018 0930       Impression and Plan: Ms. Soloway is a very pleasant 28 yo caucasian female with locally advanced stage III ductal carcinoma of the right breast, HER-2- and ER negative. She is BRCA positive.   Again, she is responding clinically.  If I do not have to do a PET scan probably until July.  We will see her back before she goes to the beach.  I want to make sure that she must wear a high level of sunscreen she must drink a lot of water.  I am so happy that her quality of life is doing so well right  now.     Volanda Napoleon, MD 6/1/202010:25 AM

## 2018-10-25 NOTE — Patient Instructions (Addendum)
[facility] Discharge Instructions for Patients Receiving Chemotherapy  Today you received the following chemotherapy agents: Tecentriq and Avastin.  To help prevent nausea and vomiting after your treatment, we encourage you to take your nausea medication as directed.   If you develop nausea and vomiting that is not controlled by your nausea medication, call the clinic.   BELOW ARE SYMPTOMS THAT SHOULD BE REPORTED IMMEDIATELY:  *FEVER GREATER THAN 100.5 F  *CHILLS WITH OR WITHOUT FEVER  NAUSEA AND VOMITING THAT IS NOT CONTROLLED WITH YOUR NAUSEA MEDICATION  *UNUSUAL SHORTNESS OF BREATH  *UNUSUAL BRUISING OR BLEEDING  TENDERNESS IN MOUTH AND THROAT WITH OR WITHOUT PRESENCE OF ULCERS  *URINARY PROBLEMS  *BOWEL PROBLEMS  UNUSUAL RASH Items with * indicate a potential emergency and should be followed up as soon as possible.  Feel free to call the clinic should you have any questions or concerns. The clinic phone number is [phone].  Please show the CHEMO ALERT CARD at check-in to the Emergency Department and triage nurse.   

## 2018-10-26 LAB — CANCER ANTIGEN 27.29: CA 27.29: 15.3 U/mL (ref 0.0–38.6)

## 2018-11-15 ENCOUNTER — Other Ambulatory Visit: Payer: Self-pay

## 2018-11-15 ENCOUNTER — Inpatient Hospital Stay: Payer: PRIVATE HEALTH INSURANCE

## 2018-11-15 ENCOUNTER — Encounter: Payer: Self-pay | Admitting: Hematology & Oncology

## 2018-11-15 ENCOUNTER — Inpatient Hospital Stay (HOSPITAL_BASED_OUTPATIENT_CLINIC_OR_DEPARTMENT_OTHER): Payer: PRIVATE HEALTH INSURANCE | Admitting: Hematology & Oncology

## 2018-11-15 VITALS — BP 116/78 | HR 93 | Temp 97.6°F | Resp 18 | Wt 168.0 lb

## 2018-11-15 DIAGNOSIS — D500 Iron deficiency anemia secondary to blood loss (chronic): Secondary | ICD-10-CM

## 2018-11-15 DIAGNOSIS — C50911 Malignant neoplasm of unspecified site of right female breast: Secondary | ICD-10-CM

## 2018-11-15 DIAGNOSIS — C7951 Secondary malignant neoplasm of bone: Secondary | ICD-10-CM

## 2018-11-15 DIAGNOSIS — Z9013 Acquired absence of bilateral breasts and nipples: Secondary | ICD-10-CM | POA: Diagnosis not present

## 2018-11-15 DIAGNOSIS — Z5112 Encounter for antineoplastic immunotherapy: Secondary | ICD-10-CM | POA: Diagnosis not present

## 2018-11-15 DIAGNOSIS — C50919 Malignant neoplasm of unspecified site of unspecified female breast: Secondary | ICD-10-CM

## 2018-11-15 DIAGNOSIS — C50111 Malignant neoplasm of central portion of right female breast: Secondary | ICD-10-CM

## 2018-11-15 LAB — CBC WITH DIFFERENTIAL (CANCER CENTER ONLY)
Abs Immature Granulocytes: 0.02 10*3/uL (ref 0.00–0.07)
Basophils Absolute: 0 10*3/uL (ref 0.0–0.1)
Basophils Relative: 0 %
Eosinophils Absolute: 0.5 10*3/uL (ref 0.0–0.5)
Eosinophils Relative: 12 %
HCT: 38.5 % (ref 36.0–46.0)
Hemoglobin: 12.8 g/dL (ref 12.0–15.0)
Immature Granulocytes: 1 %
Lymphocytes Relative: 14 %
Lymphs Abs: 0.6 10*3/uL — ABNORMAL LOW (ref 0.7–4.0)
MCH: 32.1 pg (ref 26.0–34.0)
MCHC: 33.2 g/dL (ref 30.0–36.0)
MCV: 96.5 fL (ref 80.0–100.0)
Monocytes Absolute: 0.4 10*3/uL (ref 0.1–1.0)
Monocytes Relative: 9 %
Neutro Abs: 2.7 10*3/uL (ref 1.7–7.7)
Neutrophils Relative %: 64 %
Platelet Count: 83 10*3/uL — ABNORMAL LOW (ref 150–400)
RBC: 3.99 MIL/uL (ref 3.87–5.11)
RDW: 12.8 % (ref 11.5–15.5)
WBC Count: 4.2 10*3/uL (ref 4.0–10.5)
nRBC: 0 % (ref 0.0–0.2)

## 2018-11-15 LAB — CMP (CANCER CENTER ONLY)
ALT: 31 U/L (ref 0–44)
AST: 36 U/L (ref 15–41)
Albumin: 4.1 g/dL (ref 3.5–5.0)
Alkaline Phosphatase: 172 U/L — ABNORMAL HIGH (ref 38–126)
Anion gap: 10 (ref 5–15)
BUN: 13 mg/dL (ref 6–20)
CO2: 23 mmol/L (ref 22–32)
Calcium: 9.8 mg/dL (ref 8.9–10.3)
Chloride: 103 mmol/L (ref 98–111)
Creatinine: 0.66 mg/dL (ref 0.44–1.00)
GFR, Est AFR Am: >60 mL/min (ref >60)
GFR, Estimated: >60 mL/min (ref >60)
Glucose, Bld: 150 mg/dL — ABNORMAL HIGH (ref 70–99)
Potassium: 3.8 mmol/L (ref 3.5–5.1)
Sodium: 136 mmol/L (ref 135–145)
Total Bilirubin: 0.5 mg/dL (ref 0.3–1.2)
Total Protein: 7.3 g/dL (ref 6.5–8.1)

## 2018-11-15 MED ORDER — SODIUM CHLORIDE 0.9% FLUSH
10.0000 mL | INTRAVENOUS | Status: DC | PRN
  Administered 2018-11-15: 10 mL
  Filled 2018-11-15: qty 10

## 2018-11-15 MED ORDER — HEPARIN SOD (PORK) LOCK FLUSH 100 UNIT/ML IV SOLN
500.0000 [IU] | Freq: Once | INTRAVENOUS | Status: AC | PRN
  Administered 2018-11-15: 14:00:00 500 [IU]
  Filled 2018-11-15: qty 5

## 2018-11-15 MED ORDER — SODIUM CHLORIDE 0.9 % IV SOLN
Freq: Once | INTRAVENOUS | Status: AC
  Administered 2018-11-15: 12:00:00 via INTRAVENOUS
  Filled 2018-11-15: qty 250

## 2018-11-15 MED ORDER — SODIUM CHLORIDE 0.9 % IV SOLN
1200.0000 mg | Freq: Once | INTRAVENOUS | Status: DC
  Administered 2018-11-15: 1200 mg via INTRAVENOUS
  Filled 2018-11-15: qty 20

## 2018-11-15 MED ORDER — ZOLEDRONIC ACID 4 MG/5ML IV CONC: 4.0000 mg | Freq: Once | INTRAVENOUS | Status: DC

## 2018-11-15 MED ORDER — SODIUM CHLORIDE 0.9 % IV SOLN
1200.0000 mg | Freq: Once | INTRAVENOUS | Status: DC
  Filled 2018-11-15: qty 20

## 2018-11-15 MED ORDER — SODIUM CHLORIDE 0.9 % IV SOLN
16.2000 mg/kg | Freq: Once | INTRAVENOUS | Status: DC
  Filled 2018-11-15: qty 48

## 2018-11-15 MED ORDER — SODIUM CHLORIDE 0.9 % IV SOLN
14.4000 mg/kg | Freq: Once | INTRAVENOUS | Status: AC
  Administered 2018-11-15: 1100 mg via INTRAVENOUS
  Filled 2018-11-15: qty 32

## 2018-11-15 NOTE — Patient Instructions (Signed)
Atezolizumab injection What is this medicine? ATEZOLIZUMAB (a te zoe LIZ ue mab) is a monoclonal antibody. It is used to treat bladder cancer (urothelial cancer), non-small cell lung cancer, small cell lung cancer, and breast cancer. This medicine may be used for other purposes; ask your health care provider or pharmacist if you have questions. COMMON BRAND NAME(S): Tecentriq What should I tell my health care provider before I take this medicine? They need to know if you have any of these conditions: -diabetes -immune system problems -infection -inflammatory bowel disease -liver disease -lung or breathing disease -lupus -nervous system problems like myasthenia gravis or Guillain-Barre syndrome -organ transplant -an unusual or allergic reaction to atezolizumab, other medicines, foods, dyes, or preservatives -pregnant or trying to get pregnant -breast-feeding How should I use this medicine? This medicine is for infusion into a vein. It is given by a health care professional in a hospital or clinic setting. A special MedGuide will be given to you before each treatment. Be sure to read this information carefully each time. Talk to your pediatrician regarding the use of this medicine in children. Special care may be needed. Overdosage: If you think you have taken too much of this medicine contact a poison control center or emergency room at once. NOTE: This medicine is only for you. Do not share this medicine with others. What if I miss a dose? It is important not to miss your dose. Call your doctor or health care professional if you are unable to keep an appointment. What may interact with this medicine? Interactions have not been studied. This list may not describe all possible interactions. Give your health care provider a list of all the medicines, herbs, non-prescription drugs, or dietary supplements you use. Also tell them if you smoke, drink alcohol, or use illegal drugs. Some items  may interact with your medicine. What should I watch for while using this medicine? Your condition will be monitored carefully while you are receiving this medicine. You may need blood work done while you are taking this medicine. Do not become pregnant while taking this medicine or for at least 5 months after stopping it. Women should inform their doctor if they wish to become pregnant or think they might be pregnant. There is a potential for serious side effects to an unborn child. Talk to your health care professional or pharmacist for more information. Do not breast-feed an infant while taking this medicine or for at least 5 months after the last dose. What side effects may I notice from receiving this medicine? Side effects that you should report to your doctor or health care professional as soon as possible: -allergic reactions like skin rash, itching or hives, swelling of the face, lips, or tongue -black, tarry stools -bloody or watery diarrhea -breathing problems -changes in vision -chest pain or chest tightness -chills -facial flushing -fever -headache -signs and symptoms of high blood sugar such as dizziness; dry mouth; dry skin; fruity breath; nausea; stomach pain; increased hunger or thirst; increased urination -signs and symptoms of liver injury like dark yellow or brown urine; general ill feeling or flu-like symptoms; light-colored stools; loss of appetite; nausea; right upper belly pain; unusually weak or tired; yellowing of the eyes or skin -stomach pain -trouble passing urine or change in the amount of urine Side effects that usually do not require medical attention (report to your doctor or health care professional if they continue or are bothersome): -cough -diarrhea -joint pain -muscle pain -muscle weakness -tiredness -weight loss   This list may not describe all possible side effects. Call your doctor for medical advice about side effects. You may report side effects  to FDA at 1-800-FDA-1088. Where should I keep my medicine? This drug is given in a hospital or clinic and will not be stored at home. NOTE: This sheet is a summary. It may not cover all possible information. If you have questions about this medicine, talk to your doctor, pharmacist, or health care provider.  2019 Elsevier/Gold Standard (2017-08-14 09:33:38) Bevacizumab injection What is this medicine? BEVACIZUMAB (be va SIZ yoo mab) is a monoclonal antibody. It is used to treat many types of cancer. This medicine may be used for other purposes; ask your health care provider or pharmacist if you have questions. COMMON BRAND NAME(S): Avastin, MVASI What should I tell my health care provider before I take this medicine? They need to know if you have any of these conditions: -diabetes -heart disease -high blood pressure -history of coughing up blood -prior anthracycline chemotherapy (e.g., doxorubicin, daunorubicin, epirubicin) -recent or ongoing radiation therapy -recent or planning to have surgery -stroke -an unusual or allergic reaction to bevacizumab, hamster proteins, mouse proteins, other medicines, foods, dyes, or preservatives -pregnant or trying to get pregnant -breast-feeding How should I use this medicine? This medicine is for infusion into a vein. It is given by a health care professional in a hospital or clinic setting. Talk to your pediatrician regarding the use of this medicine in children. Special care may be needed. Overdosage: If you think you have taken too much of this medicine contact a poison control center or emergency room at once. NOTE: This medicine is only for you. Do not share this medicine with others. What if I miss a dose? It is important not to miss your dose. Call your doctor or health care professional if you are unable to keep an appointment. What may interact with this medicine? Interactions are not expected. This list may not describe all possible  interactions. Give your health care provider a list of all the medicines, herbs, non-prescription drugs, or dietary supplements you use. Also tell them if you smoke, drink alcohol, or use illegal drugs. Some items may interact with your medicine. What should I watch for while using this medicine? Your condition will be monitored carefully while you are receiving this medicine. You will need important blood work and urine testing done while you are taking this medicine. This medicine may increase your risk to bruise or bleed. Call your doctor or health care professional if you notice any unusual bleeding. This medicine should be started at least 28 days following major surgery and the site of the surgery should be totally healed. Check with your doctor before scheduling dental work or surgery while you are receiving this treatment. Talk to your doctor if you have recently had surgery or if you have a wound that has not healed. Do not become pregnant while taking this medicine or for 6 months after stopping it. Women should inform their doctor if they wish to become pregnant or think they might be pregnant. There is a potential for serious side effects to an unborn child. Talk to your health care professional or pharmacist for more information. Do not breast-feed an infant while taking this medicine and for 6 months after the last dose. This medicine has caused ovarian failure in some women. This medicine may interfere with the ability to have a child. You should talk to your doctor or health care professional if  you are concerned about your fertility. What side effects may I notice from receiving this medicine? Side effects that you should report to your doctor or health care professional as soon as possible: -allergic reactions like skin rash, itching or hives, swelling of the face, lips, or tongue -chest pain or chest tightness -chills -coughing up blood -high fever -seizures -severe  constipation -signs and symptoms of bleeding such as bloody or black, tarry stools; red or dark-brown urine; spitting up blood or brown material that looks like coffee grounds; red spots on the skin; unusual bruising or bleeding from the eye, gums, or nose -signs and symptoms of a blood clot such as breathing problems; chest pain; severe, sudden headache; pain, swelling, warmth in the leg -signs and symptoms of a stroke like changes in vision; confusion; trouble speaking or understanding; severe headaches; sudden numbness or weakness of the face, arm or leg; trouble walking; dizziness; loss of balance or coordination -stomach pain -sweating -swelling of legs or ankles -vomiting -weight gain Side effects that usually do not require medical attention (report to your doctor or health care professional if they continue or are bothersome): -back pain -changes in taste -decreased appetite -dry skin -nausea -tiredness This list may not describe all possible side effects. Call your doctor for medical advice about side effects. You may report side effects to FDA at 1-800-FDA-1088. Where should I keep my medicine? This drug is given in a hospital or clinic and will not be stored at home. NOTE: This sheet is a summary. It may not cover all possible information. If you have questions about this medicine, talk to your doctor, pharmacist, or health care provider.  2019 Elsevier/Gold Standard (2016-05-09 14:33:29)

## 2018-11-15 NOTE — Patient Instructions (Signed)

## 2018-11-15 NOTE — Progress Notes (Signed)
Reviewed all labwork with Dr Ennever.  Ok to treat today.   

## 2018-11-15 NOTE — Progress Notes (Signed)
Hematology and Oncology Follow Up Visit  Sophia [name] [id number] [birth year] 28 y.o. 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 - BRCA (+) - ER-/PR-/HER2-   Vertebral metastases on 05/17/2018  --  TMB -low;  MSI - stable; PIK3CA - wt  Iron deficiency anemia   Past Therapy: Taxotere/Carboplatin/Herceptin/Perjeta -s/p cycle 4 S/P Bilateral mastectomies - 10/09/2017 Ovarian ablation with Lupron - stopped 11/06/2017 S/p TAH-BSO on 01/19/2018  Current Therapy:   Tecentriq/Avastin -- q 3 wk cycles --s/p cycle #6 - start 06/23/2018 Sophia [name] (Olapirib)   300 mg po BID -- start 06/23/2018 Radiation Therapy -- back and ribs - start on 07/21/2018 IV Iron as indicated Zometa 4 mg IV q 3 month -- next dose 11/2018   Interim History: Sophia [name] is here today for follow-up.  She is doing incredibly well.  She really looks good.  Actually, she is going to the beach with the big family this upcoming weekend.  She did go to the beach week or so ago.  She had a wonderful time.  Is so nice to see that she is doing so well.  The last CA 27.29 was only 15.3.  Hopefully, this is a good indicator that things are working for her.  She has had no pain.  She has had no nausea or vomiting.  She has had no cough.  She has had no headache.  There is been no change in bowel or bladder habits.  She has had no bleeding.  Overall, her performance status is ECOG 0.  Medications:  Allergies as of 11/15/2018   No Known Allergies     Medication List       Accurate as of November 15, 2018 10:32 AM. If you have any questions, ask your nurse or doctor.        olaparib 150 MG tablet Commonly known as: LYNPARZA Take 2 tablets (300 mg total) by mouth 2 (two) times daily. Swallow whole.       Allergies: No Known Allergies  Past Medical History, Surgical history, Social history, and Family History were reviewed and updated.   Review of Systems: Review of Systems  Constitutional: Negative.   HENT: Negative.   Eyes: Negative.   Respiratory: Negative.   Cardiovascular: Negative.   Gastrointestinal: Negative.   Genitourinary: Negative.   Musculoskeletal: Positive for back pain.  Skin: Negative.   Neurological: Negative.   Endo/Heme/Allergies: Negative.   Psychiatric/Behavioral: Negative.      Physical Exam:  weight is 168 lb (76.2 kg). Her oral temperature is 97.6 F (36.4 C). Her blood pressure is 116/78 and her pulse is 93. Her respiration is 18 and oxygen saturation is 100%.   Wt Readings from Last 3 Encounters:  11/15/18 168 lb (76.2 kg)  10/25/18 165 lb (74.8 kg)  10/04/18 166 lb (75.3 kg)    Physical Exam Vitals signs reviewed.  Constitutional:      Comments: Breast exam shows bilateral mastectomies with implants.  These are well-healed.  There is no erythema.  There is no warmth.  There is no palpable axillary lymph nodes.  She does have some fullness in the right axilla.  HENT:     Head: Normocephalic and atraumatic.  Eyes:     Pupils: Pupils are equal, round, and reactive to light.  Neck:     Musculoskeletal: Normal range of motion.     Comments: She has some improvement in the fullness in the right cervical  and supraclavicular region.   Cardiovascular:     Rate and Rhythm: Normal rate and regular rhythm.     Heart sounds: Normal heart sounds.  Pulmonary:     Effort: Pulmonary effort is normal.     Breath sounds: Normal breath sounds.  Abdominal:     General: Bowel sounds are normal.     Palpations: Abdomen is soft.  Musculoskeletal: Normal range of motion.        General: No tenderness or deformity.     Comments: Back exam shows no tenderness over the spine.  There may be some slight spasms in the right lumbar paraspinal muscles.  There is some tenderness along the right flank.  Lymphadenopathy:     Cervical: No cervical adenopathy.  Skin:    General: Skin is warm and dry.      Findings: No erythema or rash.  Neurological:     Mental Status: She is alert and oriented to person, place, and time.  Psychiatric:        Behavior: Behavior normal.        Thought Content: Thought content normal.        Judgment: Judgment normal.      Lab Results  Component Value Date   WBC 4.2 11/15/2018   HGB 12.8 11/15/2018   HCT 38.5 11/15/2018   MCV 96.5 11/15/2018   PLT 83 (L) 11/15/2018   Lab Results  Component Value Date   FERRITIN 252 10/04/2018   IRON 74 10/04/2018   TIBC 239 10/04/2018   UIBC 165 10/04/2018   IRONPCTSAT 31 10/04/2018   Lab Results  Component Value Date   RBC 3.99 11/15/2018   No results found for: KPAFRELGTCHN, LAMBDASER, KAPLAMBRATIO No results found for: IGGSERUM, IGA, IGMSERUM No results found for: Kathrynn Ducking, MSPIKE, SPEI   Chemistry      Component Value Date/Time   NA 136 10/25/2018 0930   K 3.9 10/25/2018 0930   CL 102 10/25/2018 0930   CO2 25 10/25/2018 0930   BUN 14 10/25/2018 0930   CREATININE 0.82 10/25/2018 0930      Component Value Date/Time   CALCIUM 9.4 10/25/2018 0930   ALKPHOS 152 (H) 10/25/2018 0930   AST 40 10/25/2018 0930   ALT 35 10/25/2018 0930   BILITOT 0.7 10/25/2018 0930       Impression and Plan: Sophia [name] is a very pleasant 28 yo caucasian female with locally advanced stage III ductal carcinoma of the right breast, HER-2- and ER negative. She is BRCA positive.   Again, she is responding clinically.  I do think that we need to do a PET scan on her.  I will set this for mid July.  We will get this done for her next cycle of treatment.  I have to believe that she is responding.  Her quality of life is doing so well.  I know that she will have a wonderful time at the beach this upcoming weekend.       Volanda Napoleon, MD 6/22/202010:32 AM

## 2018-11-16 LAB — IRON AND TIBC
Iron: 61 ug/dL (ref 41–142)
Saturation Ratios: 24 % (ref 21–57)
TIBC: 251 ug/dL (ref 236–444)
UIBC: 190 ug/dL (ref 120–384)

## 2018-11-16 LAB — FOLLICLE STIMULATING HORMONE: FSH: 94.8 m[IU]/mL

## 2018-11-16 LAB — TSH: TSH: 0.548 u[IU]/mL (ref 0.308–3.960)

## 2018-11-16 LAB — FERRITIN: Ferritin: 218 ng/mL (ref 11–307)

## 2018-11-16 LAB — LUTEINIZING HORMONE: LH: 76.8 m[IU]/mL

## 2018-11-16 LAB — CANCER ANTIGEN 27.29: CA 27.29: 21.2 U/mL (ref 0.0–38.6)

## 2018-11-19 LAB — ESTRADIOL, ULTRA SENS: Estradiol, Sensitive: 3.2 pg/mL

## 2018-11-23 ENCOUNTER — Telehealth: Payer: Self-pay | Admitting: Hematology & Oncology

## 2018-11-23 NOTE — Telephone Encounter (Signed)
Called and LMVm for patient with date/time/location/instructions for upcoming PET Scan

## 2018-12-06 ENCOUNTER — Other Ambulatory Visit: Payer: PRIVATE HEALTH INSURANCE

## 2018-12-06 ENCOUNTER — Ambulatory Visit: Payer: PRIVATE HEALTH INSURANCE | Admitting: Family

## 2018-12-06 ENCOUNTER — Ambulatory Visit: Payer: PRIVATE HEALTH INSURANCE

## 2018-12-07 ENCOUNTER — Other Ambulatory Visit: Payer: Self-pay

## 2018-12-07 ENCOUNTER — Ambulatory Visit (HOSPITAL_COMMUNITY)
Admission: RE | Admit: 2018-12-07 | Discharge: 2018-12-07 | Disposition: A | Discharge status: Home / Self Care | Payer: PRIVATE HEALTH INSURANCE | Source: Ambulatory Visit | Attending: Hematology & Oncology | Admitting: Hematology & Oncology

## 2018-12-07 DIAGNOSIS — C7951 Secondary malignant neoplasm of bone: Secondary | ICD-10-CM | POA: Insufficient documentation

## 2018-12-07 DIAGNOSIS — C50919 Malignant neoplasm of unspecified site of unspecified female breast: Secondary | ICD-10-CM | POA: Insufficient documentation

## 2018-12-07 LAB — GLUCOSE, CAPILLARY: Glucose-Capillary: 86 mg/dL (ref 70–99)

## 2018-12-07 MED ORDER — FLUDEOXYGLUCOSE F - 18 (FDG) INJECTION
8.8000 | Freq: Once | INTRAVENOUS | Status: AC | PRN
  Administered 2018-12-07: 12:00:00 8.8 via INTRAVENOUS

## 2018-12-09 ENCOUNTER — Encounter: Payer: Self-pay | Admitting: Hematology & Oncology

## 2018-12-09 ENCOUNTER — Inpatient Hospital Stay: Payer: PRIVATE HEALTH INSURANCE

## 2018-12-09 ENCOUNTER — Other Ambulatory Visit: Payer: Self-pay

## 2018-12-09 ENCOUNTER — Inpatient Hospital Stay (HOSPITAL_BASED_OUTPATIENT_CLINIC_OR_DEPARTMENT_OTHER): Payer: PRIVATE HEALTH INSURANCE | Admitting: Hematology & Oncology

## 2018-12-09 ENCOUNTER — Inpatient Hospital Stay: Payer: PRIVATE HEALTH INSURANCE | Attending: Hematology & Oncology

## 2018-12-09 VITALS — BP 111/72 | HR 88 | Temp 97.6°F | Resp 18 | Wt 166.0 lb

## 2018-12-09 DIAGNOSIS — C7951 Secondary malignant neoplasm of bone: Secondary | ICD-10-CM

## 2018-12-09 DIAGNOSIS — Z5112 Encounter for antineoplastic immunotherapy: Secondary | ICD-10-CM | POA: Insufficient documentation

## 2018-12-09 DIAGNOSIS — C50919 Malignant neoplasm of unspecified site of unspecified female breast: Secondary | ICD-10-CM

## 2018-12-09 DIAGNOSIS — C50911 Malignant neoplasm of unspecified site of right female breast: Secondary | ICD-10-CM | POA: Diagnosis not present

## 2018-12-09 DIAGNOSIS — M79605 Pain in left leg: Secondary | ICD-10-CM

## 2018-12-09 DIAGNOSIS — C50111 Malignant neoplasm of central portion of right female breast: Secondary | ICD-10-CM

## 2018-12-09 DIAGNOSIS — Z171 Estrogen receptor negative status [ER-]: Secondary | ICD-10-CM

## 2018-12-09 LAB — CMP (CANCER CENTER ONLY)
ALT: 34 U/L (ref 0–44)
AST: 42 U/L — ABNORMAL HIGH (ref 15–41)
Albumin: 4.2 g/dL (ref 3.5–5.0)
Alkaline Phosphatase: 177 U/L — ABNORMAL HIGH (ref 38–126)
Anion gap: 9 (ref 5–15)
BUN: 15 mg/dL (ref 6–20)
CO2: 26 mmol/L (ref 22–32)
Calcium: 9.1 mg/dL (ref 8.9–10.3)
Chloride: 103 mmol/L (ref 98–111)
Creatinine: 0.66 mg/dL (ref 0.44–1.00)
GFR, Est AFR Am: >60 mL/min (ref >60)
GFR, Estimated: >60 mL/min (ref >60)
Glucose, Bld: 95 mg/dL (ref 70–99)
Potassium: 4 mmol/L (ref 3.5–5.1)
Sodium: 138 mmol/L (ref 135–145)
Total Bilirubin: 0.7 mg/dL (ref 0.3–1.2)
Total Protein: 7.6 g/dL (ref 6.5–8.1)

## 2018-12-09 LAB — CBC WITH DIFFERENTIAL (CANCER CENTER ONLY)
Abs Immature Granulocytes: 0.01 10*3/uL (ref 0.00–0.07)
Basophils Absolute: 0 10*3/uL (ref 0.0–0.1)
Basophils Relative: 1 %
Eosinophils Absolute: 0.5 10*3/uL (ref 0.0–0.5)
Eosinophils Relative: 12 %
HCT: 40.3 % (ref 36.0–46.0)
Hemoglobin: 13.6 g/dL (ref 12.0–15.0)
Immature Granulocytes: 0 %
Lymphocytes Relative: 16 %
Lymphs Abs: 0.7 10*3/uL (ref 0.7–4.0)
MCH: 31.8 pg (ref 26.0–34.0)
MCHC: 33.7 g/dL (ref 30.0–36.0)
MCV: 94.2 fL (ref 80.0–100.0)
Monocytes Absolute: 0.5 10*3/uL (ref 0.1–1.0)
Monocytes Relative: 11 %
Neutro Abs: 2.6 10*3/uL (ref 1.7–7.7)
Neutrophils Relative %: 60 %
Platelet Count: 82 10*3/uL — ABNORMAL LOW (ref 150–400)
RBC: 4.28 MIL/uL (ref 3.87–5.11)
RDW: 12.6 % (ref 11.5–15.5)
WBC Count: 4.3 10*3/uL (ref 4.0–10.5)
nRBC: 0 % (ref 0.0–0.2)

## 2018-12-09 MED ORDER — SODIUM CHLORIDE 0.9 % IV SOLN
15.0000 mg/kg | Freq: Once | INTRAVENOUS | Status: AC
  Administered 2018-12-09: 1100 mg via INTRAVENOUS
  Filled 2018-12-09: qty 32

## 2018-12-09 MED ORDER — SODIUM CHLORIDE 0.9% FLUSH
10.0000 mL | INTRAVENOUS | Status: DC | PRN
  Administered 2018-12-09: 10 mL
  Filled 2018-12-09: qty 10

## 2018-12-09 MED ORDER — SODIUM CHLORIDE 0.9 % IV SOLN
Freq: Once | INTRAVENOUS | Status: AC
  Administered 2018-12-09: 11:00:00 via INTRAVENOUS
  Filled 2018-12-09: qty 250

## 2018-12-09 MED ORDER — SODIUM CHLORIDE 0.9 % IV SOLN
1200.0000 mg | Freq: Once | INTRAVENOUS | Status: AC
  Administered 2018-12-09: 1200 mg via INTRAVENOUS
  Filled 2018-12-09: qty 20

## 2018-12-09 MED ORDER — HEPARIN SOD (PORK) LOCK FLUSH 100 UNIT/ML IV SOLN
500.0000 [IU] | Freq: Once | INTRAVENOUS | Status: AC | PRN
  Administered 2018-12-09: 500 [IU]
  Filled 2018-12-09: qty 5

## 2018-12-09 NOTE — Patient Instructions (Signed)

## 2018-12-09 NOTE — Progress Notes (Signed)
OK to treat with today's labs per Dr. Marin Olp.

## 2018-12-09 NOTE — Progress Notes (Signed)
Hematology and Oncology Follow Up Visit  Sophia [name] [id number] 07-Dec-1990 28 y.o. 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  She had a wonderful time at the beach.  She was with her entire family.  She actually went hang gliding.  I am very impressed with this.  She did hurt her left leg.  She fell irritated a pubic bone fracture that she had before.  We did do a PET scan on her.  This showed a mixed response.  In some areas look better in some areas looked more active.  I think that overall, since her performance status is doing so much better, that we stay the course with her treatment with the immunotherapy and the olaparib.  Her last CA 27.29 was holding steady at 21.  It really never has been that elevated.  She is eating well.  She not complain of any pain outside of that with the left leg.  She is having no problems with bowels or bladder.  She is not bleeding.  Overall, her performance status is ECOG 0.  Medications:  Allergies as of 12/09/2018   No Known Allergies     Medication List       Accurate as of December 09, 2018 10:42 AM. If you have any questions, ask your nurse or doctor.        olaparib 150 MG tablet Commonly known as: LYNPARZA Take 2  tablets (300 mg total) by mouth 2 (two) times daily. Swallow whole.       Allergies: No Known Allergies  Past Medical History, Surgical history, Social history, and Family History were reviewed and updated.  Review of Systems: Review of Systems  Constitutional: Negative.   HENT: Negative.   Eyes: Negative.   Respiratory: Negative.   Cardiovascular: Negative.   Gastrointestinal: Negative.   Genitourinary: Negative.   Musculoskeletal: Positive for back pain.  Skin: Negative.   Neurological: Negative.   Endo/Heme/Allergies: Negative.   Psychiatric/Behavioral: Negative.      Physical Exam:  weight is 166 lb (75.3 kg). Her oral temperature is 97.6 F (36.4 C). Her blood pressure is 111/72 and her pulse is 88. Her respiration is 18 and oxygen saturation is 100%.   Wt Readings from Last 3 Encounters:  12/09/18 166 lb (75.3 kg)  11/15/18 168 lb (76.2 kg)  10/25/18 165 lb (74.8 kg)    Physical Exam Vitals signs reviewed.  Constitutional:      Comments: Breast exam shows bilateral mastectomies with implants.  These are well-healed.  There is no erythema.  There is no warmth.  There is no palpable axillary lymph nodes.  She does have some fullness in the right axilla.  HENT:     Head: Normocephalic and atraumatic.  Eyes:     Pupils:  Pupils are equal, round, and reactive to light.  Neck:     Musculoskeletal: Normal range of motion.     Comments: She has some improvement in the fullness in the right cervical and supraclavicular region.   Cardiovascular:     Rate and Rhythm: Normal rate and regular rhythm.     Heart sounds: Normal heart sounds.  Pulmonary:     Effort: Pulmonary effort is normal.     Breath sounds: Normal breath sounds.  Abdominal:     General: Bowel sounds are normal.     Palpations: Abdomen is soft.  Musculoskeletal: Normal range of motion.        General: No tenderness or deformity.     Comments: Back exam shows no tenderness over the spine.  There may  be some slight spasms in the right lumbar paraspinal muscles.  There is some tenderness along the right flank.  Lymphadenopathy:     Cervical: No cervical adenopathy.  Skin:    General: Skin is warm and dry.     Findings: No erythema or rash.  Neurological:     Mental Status: She is alert and oriented to person, place, and time.  Psychiatric:        Behavior: Behavior normal.        Thought Content: Thought content normal.        Judgment: Judgment normal.      Lab Results  Component Value Date   WBC 4.3 12/09/2018   HGB 13.6 12/09/2018   HCT 40.3 12/09/2018   MCV 94.2 12/09/2018   PLT 82 (L) 12/09/2018   Lab Results  Component Value Date   FERRITIN 218 11/15/2018   IRON 61 11/15/2018   TIBC 251 11/15/2018   UIBC 190 11/15/2018   IRONPCTSAT 24 11/15/2018   Lab Results  Component Value Date   RBC 4.28 12/09/2018   No results found for: KPAFRELGTCHN, LAMBDASER, KAPLAMBRATIO No results found for: IGGSERUM, IGA, IGMSERUM No results found for: Ronnald Ramp, A1GS, A2GS, Violet Baldy, MSPIKE, SPEI   Chemistry      Component Value Date/Time   NA 138 12/09/2018 0900   K 4.0 12/09/2018 0900   CL 103 12/09/2018 0900   CO2 26 12/09/2018 0900   BUN 15 12/09/2018 0900   CREATININE 0.66 12/09/2018 0900      Component Value Date/Time   CALCIUM 9.1 12/09/2018 0900   ALKPHOS 177 (H) 12/09/2018 0900   AST 42 (H) 12/09/2018 0900   ALT 34 12/09/2018 0900   BILITOT 0.7 12/09/2018 0900       Impression and Plan: Sophia [name] is a very pleasant 28 yo caucasian female with locally advanced stage III ductal carcinoma of the right breast, HER-2- and ER negative. She is BRCA positive.   I do think that the response so far has been adequate.  I think that her quality of life is better.  She is working.  She is able to enjoy what she likes to do.  Given all this, I think we just do not make any changes with her protocol.  I offered her radiation therapy down to  the pelvic area.  She wished to avoid this for right now.  She really has had a hard time in the past with radiation.  We will plan to get her back to see [place] in another 3 weeks.Marland Kitchen       Volanda Napoleon, MD 7/16/202010:42 AM

## 2018-12-10 LAB — CANCER ANTIGEN 27.29: CA 27.29: 23.4 U/mL (ref 0.0–38.6)

## 2018-12-14 ENCOUNTER — Other Ambulatory Visit: Payer: Self-pay | Admitting: *Deleted

## 2018-12-14 LAB — ESTRADIOL, ULTRA SENS: Estradiol, Sensitive: 4.2 pg/mL

## 2018-12-14 MED ORDER — SODIUM CHLORIDE 0.9 % IV SOLN: 15.0000 mg/kg | Freq: Once | INTRAVENOUS | 0 refills | Status: DC

## 2018-12-14 MED ORDER — TECENTRIQ 1200 MG/20ML IV SOLN: 1200.0000 mg | INTRAVENOUS | 12 refills | Status: DC

## 2018-12-14 MED ORDER — TECENTRIQ 1200 MG/20ML IV SOLN: 1200.0000 mg | INTRAVENOUS | 12 refills | Status: AC

## 2018-12-14 MED ORDER — SODIUM CHLORIDE 0.9 % IV SOLN: 15.0000 mg/kg | Freq: Once | INTRAVENOUS | 12 refills | Status: DC

## 2018-12-14 MED ORDER — SODIUM CHLORIDE 0.9 % IV SOLN: 15.0000 mg/kg | Freq: Once | INTRAVENOUS | 12 refills | Status: AC

## 2018-12-21 ENCOUNTER — Encounter: Payer: Self-pay | Admitting: Hematology & Oncology

## 2018-12-23 ENCOUNTER — Encounter: Payer: Self-pay | Admitting: Hematology & Oncology

## 2018-12-30 ENCOUNTER — Inpatient Hospital Stay: Payer: PRIVATE HEALTH INSURANCE

## 2018-12-30 ENCOUNTER — Other Ambulatory Visit: Payer: Self-pay

## 2018-12-30 ENCOUNTER — Telehealth: Payer: Self-pay | Admitting: Hematology & Oncology

## 2018-12-30 ENCOUNTER — Inpatient Hospital Stay (HOSPITAL_BASED_OUTPATIENT_CLINIC_OR_DEPARTMENT_OTHER): Payer: PRIVATE HEALTH INSURANCE | Admitting: Family

## 2018-12-30 ENCOUNTER — Telehealth: Payer: Self-pay | Admitting: *Deleted

## 2018-12-30 DIAGNOSIS — R1907 Generalized intra-abdominal and pelvic swelling, mass and lump: Secondary | ICD-10-CM | POA: Insufficient documentation

## 2018-12-30 DIAGNOSIS — C7951 Secondary malignant neoplasm of bone: Secondary | ICD-10-CM | POA: Insufficient documentation

## 2018-12-30 DIAGNOSIS — Z5111 Encounter for antineoplastic chemotherapy: Secondary | ICD-10-CM | POA: Insufficient documentation

## 2018-12-30 DIAGNOSIS — C50111 Malignant neoplasm of central portion of right female breast: Secondary | ICD-10-CM

## 2018-12-30 DIAGNOSIS — C50911 Malignant neoplasm of unspecified site of right female breast: Secondary | ICD-10-CM | POA: Diagnosis not present

## 2018-12-30 DIAGNOSIS — Z5112 Encounter for antineoplastic immunotherapy: Secondary | ICD-10-CM | POA: Insufficient documentation

## 2018-12-30 DIAGNOSIS — R809 Proteinuria, unspecified: Secondary | ICD-10-CM

## 2018-12-30 DIAGNOSIS — C50919 Malignant neoplasm of unspecified site of unspecified female breast: Secondary | ICD-10-CM

## 2018-12-30 DIAGNOSIS — Z171 Estrogen receptor negative status [ER-]: Secondary | ICD-10-CM | POA: Insufficient documentation

## 2018-12-30 DIAGNOSIS — D500 Iron deficiency anemia secondary to blood loss (chronic): Secondary | ICD-10-CM | POA: Diagnosis not present

## 2018-12-30 DIAGNOSIS — D509 Iron deficiency anemia, unspecified: Secondary | ICD-10-CM | POA: Insufficient documentation

## 2018-12-30 DIAGNOSIS — Z510 Encounter for antineoplastic radiation therapy: Secondary | ICD-10-CM | POA: Diagnosis present

## 2018-12-30 DIAGNOSIS — Z9013 Acquired absence of bilateral breasts and nipples: Secondary | ICD-10-CM | POA: Insufficient documentation

## 2018-12-30 DIAGNOSIS — M25511 Pain in right shoulder: Secondary | ICD-10-CM | POA: Insufficient documentation

## 2018-12-30 LAB — CBC WITH DIFFERENTIAL (CANCER CENTER ONLY)
Abs Immature Granulocytes: 0.01 10*3/uL (ref 0.00–0.07)
Basophils Absolute: 0 10*3/uL (ref 0.0–0.1)
Basophils Relative: 1 %
Eosinophils Absolute: 0.4 10*3/uL (ref 0.0–0.5)
Eosinophils Relative: 11 %
HCT: 39.1 % (ref 36.0–46.0)
Hemoglobin: 12.9 g/dL (ref 12.0–15.0)
Immature Granulocytes: 0 %
Lymphocytes Relative: 18 %
Lymphs Abs: 0.8 10*3/uL (ref 0.7–4.0)
MCH: 30.7 pg (ref 26.0–34.0)
MCHC: 33 g/dL (ref 30.0–36.0)
MCV: 93.1 fL (ref 80.0–100.0)
Monocytes Absolute: 0.4 10*3/uL (ref 0.1–1.0)
Monocytes Relative: 10 %
Neutro Abs: 2.5 10*3/uL (ref 1.7–7.7)
Neutrophils Relative %: 60 %
Platelet Count: 82 10*3/uL — ABNORMAL LOW (ref 150–400)
RBC: 4.2 MIL/uL (ref 3.87–5.11)
RDW: 12.6 % (ref 11.5–15.5)
WBC Count: 4.1 10*3/uL (ref 4.0–10.5)
nRBC: 0 % (ref 0.0–0.2)

## 2018-12-30 LAB — CMP (CANCER CENTER ONLY)
ALT: 30 U/L (ref 0–44)
AST: 39 U/L (ref 15–41)
Albumin: 3.9 g/dL (ref 3.5–5.0)
Alkaline Phosphatase: 153 U/L — ABNORMAL HIGH (ref 38–126)
Anion gap: 8 (ref 5–15)
BUN: 19 mg/dL (ref 6–20)
CO2: 26 mmol/L (ref 22–32)
Calcium: 9.3 mg/dL (ref 8.9–10.3)
Chloride: 103 mmol/L (ref 98–111)
Creatinine: 0.65 mg/dL (ref 0.44–1.00)
GFR, Est AFR Am: >60 mL/min (ref >60)
GFR, Estimated: >60 mL/min (ref >60)
Glucose, Bld: 84 mg/dL (ref 70–99)
Potassium: 4.1 mmol/L (ref 3.5–5.1)
Sodium: 137 mmol/L (ref 135–145)
Total Bilirubin: 0.7 mg/dL (ref 0.3–1.2)
Total Protein: 7.1 g/dL (ref 6.5–8.1)

## 2018-12-30 LAB — TOTAL PROTEIN, URINE DIPSTICK: Protein, ur: NEGATIVE mg/dL

## 2018-12-30 MED ORDER — SODIUM CHLORIDE 0.9% FLUSH
10.0000 mL | INTRAVENOUS | Status: DC | PRN
  Administered 2018-12-30: 14:00:00 10 mL
  Filled 2018-12-30: qty 10

## 2018-12-30 MED ORDER — SODIUM CHLORIDE 0.9 % IV SOLN
15.0000 mg/kg | Freq: Once | INTRAVENOUS | Status: AC
  Administered 2018-12-30: 1100 mg via INTRAVENOUS
  Filled 2018-12-30: qty 32

## 2018-12-30 MED ORDER — SODIUM CHLORIDE 0.9 % IV SOLN
1200.0000 mg | Freq: Once | INTRAVENOUS | Status: AC
  Administered 2018-12-30: 13:00:00 1200 mg via INTRAVENOUS
  Filled 2018-12-30: qty 20

## 2018-12-30 MED ORDER — HEPARIN SOD (PORK) LOCK FLUSH 100 UNIT/ML IV SOLN
500.0000 [IU] | Freq: Once | INTRAVENOUS | Status: AC | PRN
  Administered 2018-12-30: 500 [IU]
  Filled 2018-12-30: qty 5

## 2018-12-30 MED ORDER — SODIUM CHLORIDE 0.9 % IV SOLN
Freq: Once | INTRAVENOUS | Status: AC
  Administered 2018-12-30: 12:00:00 via INTRAVENOUS
  Filled 2018-12-30: qty 250

## 2018-12-30 NOTE — Progress Notes (Signed)
Hematology and Oncology Follow Up Visit  Yer Castello [id number] [birth year] 28 y.o. 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 Kessner is here today for follow-up and treatment. She is doing well and is getting around great with her cane.  She states that she has occasional pain in the right shoulder if she is more active. This resolves when she takes ibuprofen.  She will be scheduling her appointment with Dr. Sondra Come to get palliative radiation to the pelvic tumor. She still has occasional numbness and tingling in left inner thigh.  No weakness, falls or syncope. No bowel or bladder incontinence.   CA 27.29 in July was 23.4.  No fever, chills, n/v, cough, rash, dizziness, SOB, chest pain, palpitations, abdominal pain or changes in bowel or bladder habits.  No swelling in her extremities at this time.  She is eating well and staying hydrated. Her weight is stable.  She is staying busy with work at home and with her sweet boys.  She did mention that she would like to see a different plastic surgeon regarding her implants and having these redone to look more symmetrical. She was also curious about having the implants removed and just using her own tissue.   ECOG Performance Status: 1 - Symptomatic but completely ambulatory  Medications:  Allergies as of 12/30/2018    No Known Allergies     Medication List       Accurate as of December 30, 2018 11:23 AM. If you have any questions, ask your nurse or doctor.        olaparib 150 MG tablet Commonly known as: LYNPARZA Take 2 tablets (300 mg total) by mouth 2 (two) times daily. Swallow whole.   Tecentriq 1200 MG/20ML Generic drug: atezolizumab Inject 20 mLs (1,200 mg total) into the vein every 21 ( twenty-one) days. Admix with 250 cc Normal Saline.  May administer with or without  .2 micron in line filter.  Not compatible with D5W       Allergies: No Known Allergies  Past Medical History, Surgical history, Social history, and Family History were reviewed and updated.  Review of Systems: All other 10 point review of systems is negative.   Physical Exam:  vitals were not taken for this visit.   Wt Readings from Last 3 Encounters:  12/30/18 165 lb (74.8 kg)  12/09/18 166 lb (75.3 kg)  11/15/18 168 lb (76.2 kg)    Ocular: Sclerae unicteric, pupils equal, round and reactive to light Ear-nose-throat: Oropharynx clear, dentition fair Lymphatic: No axillary, cervical or supraclavicular adenopathy Lungs no rales or rhonchi, good excursion bilaterally Heart regular rate and rhythm, no murmur appreciated Abd soft, nontender, positive bowel sounds, no liver or spleen tip palpated on exam, no fluid wave  MSK no focal spinal tenderness, no joint edema Neuro: non-focal, well-oriented, appropriate affect Breasts: Deferred this visit, patient seen in  infusion   Lab Results  Component Value Date   WBC 4.1 12/30/2018   HGB 12.9 12/30/2018   HCT 39.1 12/30/2018   MCV 93.1 12/30/2018   PLT 82 (L) 12/30/2018   Lab Results  Component Value Date   FERRITIN 218 11/15/2018   IRON 61 11/15/2018   TIBC 251 11/15/2018   UIBC 190 11/15/2018   IRONPCTSAT 24 11/15/2018   Lab Results  Component Value Date   RBC 4.20 12/30/2018   No results found for: KPAFRELGTCHN, LAMBDASER, KAPLAMBRATIO No results  found for: IGGSERUM, IGA, IGMSERUM No results found for: Odetta Pink, SPEI   Chemistry      Component Value Date/Time   NA 137 12/30/2018 1040   K 4.1 12/30/2018 1040   CL 103 12/30/2018 1040   CO2 26 12/30/2018 1040   BUN 19 12/30/2018 1040   CREATININE 0.65 12/30/2018 1040      Component Value Date/Time   CALCIUM 9.3 12/30/2018 1040   ALKPHOS 153 (H) 12/30/2018 1040   AST 39 12/30/2018 1040   ALT 30 12/30/2018 1040   BILITOT 0.7 12/30/2018 1040       Impression and Plan: Ms. Finnicum is a very pleasant 28 yo caucasian female with metastatic breast cancer, HER 2 and ER negative, BRCA positive.  She continues to do well and will be following up with Dr. Sondra Come for palliative radiation of the pelvic tumor.  We will proceed with treatment today as planned.  CA 27.29 is pending.  We will refer her to Dr. Audelia Hives with plastic surgery.  We will see her back in another 3 weeks.  She will contact our office with any questions or concerns. We can certainly see her sooner if needed.   Laverna Peace, NP 8/6/202011:23 AM

## 2018-12-30 NOTE — Telephone Encounter (Signed)
Left voicemail for call back to schedule appointment with Dr. Sondra Come.

## 2018-12-30 NOTE — Patient Instructions (Signed)
Bevacizumab injection What is this medicine? BEVACIZUMAB (be va SIZ yoo mab) is a monoclonal antibody. It is used to treat many types of cancer. This medicine may be used for other purposes; ask your health care provider or pharmacist if you have questions. COMMON BRAND NAME(S): Avastin, MVASI, Zirabev What should I tell my health care provider before I take this medicine? They need to know if you have any of these conditions:  diabetes  heart disease  high blood pressure  history of coughing up blood  prior anthracycline chemotherapy (e.g., doxorubicin, daunorubicin, epirubicin)  recent or ongoing radiation therapy  recent or planning to have surgery  stroke  an unusual or allergic reaction to bevacizumab, hamster proteins, mouse proteins, other medicines, foods, dyes, or preservatives  pregnant or trying to get pregnant  breast-feeding How should I use this medicine? This medicine is for infusion into a vein. It is given by a health care professional in a hospital or clinic setting. Talk to your pediatrician regarding the use of this medicine in children. Special care may be needed. Overdosage: If you think you have taken too much of this medicine contact a poison control center or emergency room at once. NOTE: This medicine is only for you. Do not share this medicine with others. What if I miss a dose? It is important not to miss your dose. Call your doctor or health care professional if you are unable to keep an appointment. What may interact with this medicine? Interactions are not expected. This list may not describe all possible interactions. Give your health care provider a list of all the medicines, herbs, non-prescription drugs, or dietary supplements you use. Also tell them if you smoke, drink alcohol, or use illegal drugs. Some items may interact with your medicine. What should I watch for while using this medicine? Your condition will be monitored carefully while  you are receiving this medicine. You will need important blood work and urine testing done while you are taking this medicine. This medicine may increase your risk to bruise or bleed. Call your doctor or health care professional if you notice any unusual bleeding. This medicine should be started at least 28 days following major surgery and the site of the surgery should be totally healed. Check with your doctor before scheduling dental work or surgery while you are receiving this treatment. Talk to your doctor if you have recently had surgery or if you have a wound that has not healed. Do not become pregnant while taking this medicine or for 6 months after stopping it. Women should inform their doctor if they wish to become pregnant or think they might be pregnant. There is a potential for serious side effects to an unborn child. Talk to your health care professional or pharmacist for more information. Do not breast-feed an infant while taking this medicine and for 6 months after the last dose. This medicine has caused ovarian failure in some women. This medicine may interfere with the ability to have a child. You should talk to your doctor or health care professional if you are concerned about your fertility. What side effects may I notice from receiving this medicine? Side effects that you should report to your doctor or health care professional as soon as possible:  allergic reactions like skin rash, itching or hives, swelling of the face, lips, or tongue  chest pain or chest tightness  chills  coughing up blood  high fever  seizures  severe constipation  signs and symptoms   of bleeding such as bloody or black, tarry stools; red or dark-brown urine; spitting up blood or brown material that looks like coffee grounds; red spots on the skin; unusual bruising or bleeding from the eye, gums, or nose  signs and symptoms of a blood clot such as breathing problems; chest pain; severe, sudden  headache; pain, swelling, warmth in the leg  signs and symptoms of a stroke like changes in vision; confusion; trouble speaking or understanding; severe headaches; sudden numbness or weakness of the face, arm or leg; trouble walking; dizziness; loss of balance or coordination  stomach pain  sweating  swelling of legs or ankles  vomiting  weight gain Side effects that usually do not require medical attention (report to your doctor or health care professional if they continue or are bothersome):  back pain  changes in taste  decreased appetite  dry skin  nausea  tiredness This list may not describe all possible side effects. Call your doctor for medical advice about side effects. You may report side effects to FDA at 1-800-FDA-1088. Where should I keep my medicine? This drug is given in a hospital or clinic and will not be stored at home. NOTE: This sheet is a summary. It may not cover all possible information. If you have questions about this medicine, talk to your doctor, pharmacist, or health care provider.  2020 Elsevier/Gold Standard (2016-05-09 14:33:29) Atezolizumab injection What is this medicine? ATEZOLIZUMAB (a te zoe LIZ ue mab) is a monoclonal antibody. It is used to treat bladder cancer (urothelial cancer), non-small cell lung cancer, small cell lung cancer, and breast cancer. This medicine may be used for other purposes; ask your health care provider or pharmacist if you have questions. COMMON BRAND NAME(S): Tecentriq What should I tell my health care provider before I take this medicine? They need to know if you have any of these conditions:  diabetes  immune system problems  infection  inflammatory bowel disease  liver disease  lung or breathing disease  lupus  nervous system problems like myasthenia gravis or Guillain-Barre syndrome  organ transplant  an unusual or allergic reaction to atezolizumab, other medicines, foods, dyes, or  preservatives  pregnant or trying to get pregnant  breast-feeding How should I use this medicine? This medicine is for infusion into a vein. It is given by a health care professional in a hospital or clinic setting. A special MedGuide will be given to you before each treatment. Be sure to read this information carefully each time. Talk to your pediatrician regarding the use of this medicine in children. Special care may be needed. Overdosage: If you think you have taken too much of this medicine contact a poison control center or emergency room at once. NOTE: This medicine is only for you. Do not share this medicine with others. What if I miss a dose? It is important not to miss your dose. Call your doctor or health care professional if you are unable to keep an appointment. What may interact with this medicine? Interactions have not been studied. This list may not describe all possible interactions. Give your health care provider a list of all the medicines, herbs, non-prescription drugs, or dietary supplements you use. Also tell them if you smoke, drink alcohol, or use illegal drugs. Some items may interact with your medicine. What should I watch for while using this medicine? Your condition will be monitored carefully while you are receiving this medicine. You may need blood work done while you are taking   this medicine. Do not become pregnant while taking this medicine or for at least 5 months after stopping it. Women should inform their doctor if they wish to become pregnant or think they might be pregnant. There is a potential for serious side effects to an unborn child. Talk to your health care professional or pharmacist for more information. Do not breast-feed an infant while taking this medicine or for at least 5 months after the last dose. What side effects may I notice from receiving this medicine? Side effects that you should report to your doctor or health care professional as soon  as possible:  allergic reactions like skin rash, itching or hives, swelling of the face, lips, or tongue  black, tarry stools  bloody or watery diarrhea  breathing problems  changes in vision  chest pain or chest tightness  chills  facial flushing  fever  headache  signs and symptoms of high blood sugar such as dizziness; dry mouth; dry skin; fruity breath; nausea; stomach pain; increased hunger or thirst; increased urination  signs and symptoms of liver injury like dark yellow or brown urine; general ill feeling or flu-like symptoms; light-colored stools; loss of appetite; nausea; right upper belly pain; unusually weak or tired; yellowing of the eyes or skin  stomach pain  trouble passing urine or change in the amount of urine Side effects that usually do not require medical attention (report to your doctor or health care professional if they continue or are bothersome):  cough  diarrhea  joint pain  muscle pain  muscle weakness  tiredness  weight loss This list may not describe all possible side effects. Call your doctor for medical advice about side effects. You may report side effects to FDA at 1-800-FDA-1088. Where should I keep my medicine? This drug is given in a hospital or clinic and will not be stored at home. NOTE: This sheet is a summary. It may not cover all possible information. If you have questions about this medicine, talk to your doctor, pharmacist, or health care provider.  2020 Elsevier/Gold Standard (2017-08-14 09:33:38)  

## 2018-12-30 NOTE — Telephone Encounter (Signed)
Has current follow-up schedule with MD on 8/27 :)

## 2018-12-31 LAB — CANCER ANTIGEN 27.29: CA 27.29: 9.2 U/mL (ref 0.0–38.6)

## 2019-01-05 ENCOUNTER — Ambulatory Visit
Admission: RE | Admit: 2019-01-05 | Discharge: 2019-01-05 | Disposition: A | Discharge status: Home / Self Care | Payer: PRIVATE HEALTH INSURANCE | Source: Ambulatory Visit | Attending: Radiation Oncology | Admitting: Radiation Oncology

## 2019-01-05 ENCOUNTER — Encounter: Payer: Self-pay | Admitting: Radiation Oncology

## 2019-01-05 ENCOUNTER — Other Ambulatory Visit: Payer: Self-pay

## 2019-01-05 VITALS — BP 113/87 | HR 80 | Temp 98.0°F | Resp 16 | Ht 69.0 in | Wt 167.0 lb

## 2019-01-05 DIAGNOSIS — C7951 Secondary malignant neoplasm of bone: Secondary | ICD-10-CM

## 2019-01-05 DIAGNOSIS — Z853 Personal history of malignant neoplasm of breast: Secondary | ICD-10-CM | POA: Insufficient documentation

## 2019-01-05 DIAGNOSIS — C50919 Malignant neoplasm of unspecified site of unspecified female breast: Secondary | ICD-10-CM

## 2019-01-05 DIAGNOSIS — R200 Anesthesia of skin: Secondary | ICD-10-CM | POA: Insufficient documentation

## 2019-01-05 DIAGNOSIS — R202 Paresthesia of skin: Secondary | ICD-10-CM | POA: Insufficient documentation

## 2019-01-05 DIAGNOSIS — M25552 Pain in left hip: Secondary | ICD-10-CM | POA: Diagnosis not present

## 2019-01-05 NOTE — Patient Instructions (Signed)
Coronavirus (COVID-19) Are you at risk?  Are you at risk for the Coronavirus (COVID-19)?  To be considered HIGH RISK for Coronavirus (COVID-19), you have to meet the following criteria:  . Traveled to [place], [place], [place], [place] or [place]; or in the [place] to [place], [place], [place], or [place]; and have fever, cough, and shortness of breath within the last 2 weeks of travel OR . Been in close contact with a person diagnosed with COVID-19 within the last 2 weeks and have fever, cough, and shortness of breath . IF YOU DO NOT MEET THESE CRITERIA, YOU ARE CONSIDERED LOW RISK FOR COVID-19.  What to do if you are HIGH RISK for COVID-19?  . If you are having a medical emergency, call 911. . Seek medical care right away. Before you go to a doctor's office, urgent care or emergency department, call ahead and tell them about your recent travel, contact with someone diagnosed with COVID-19, and your symptoms. You should receive instructions from your physician's office regarding next steps of care.  . When you arrive at healthcare provider, tell the healthcare staff immediately you have returned from visiting [place], [place], [place], [place] or [place]; or traveled in the [place] to [place], [place], [place], or [place]; in the last two weeks or you have been in close contact with a person diagnosed with COVID-19 in the last 2 weeks.   . Tell the health care staff about your symptoms: fever, cough and shortness of breath. . After you have been seen by a medical provider, you will be either: o Tested for (COVID-19) and discharged home on quarantine except to seek medical care if symptoms worsen, and asked to  - Stay home and avoid contact with others until you get your results (4-5 days)  - Avoid travel on public transportation if possible (such as bus, train, or airplane) or o Sent to the Emergency Department by EMS for evaluation, COVID-19 testing, and possible  admission depending on your condition and test results.  What to do if you are LOW RISK for COVID-19?  Reduce your risk of any infection by using the same precautions used for avoiding the common cold or flu:  . Wash your hands often with soap and warm water for at least 20 seconds.  If soap and water are not readily available, use an alcohol-based hand sanitizer with at least 60% alcohol.  . If coughing or sneezing, cover your mouth and nose by coughing or sneezing into the elbow areas of your shirt or coat, into a tissue or into your sleeve (not your hands). . Avoid shaking hands with others and consider head nods or verbal greetings only. . Avoid touching your eyes, nose, or mouth with unwashed hands.  . Avoid close contact with people who are sick. . Avoid places or events with large numbers of people in one location, like concerts or sporting events. . Carefully consider travel plans you have or are making. . If you are planning any travel outside or inside the [place], visit the CDC's Travelers' Health webpage for the latest health notices. . If you have some symptoms but not all symptoms, continue to monitor at home and seek medical attention if your symptoms worsen. . If you are having a medical emergency, call 911.   ADDITIONAL HEALTHCARE OPTIONS FOR PATIENTS  [facility] / e-Visit: [email or web address]         [facility]: [phone]     Urgent Care: [phone]                   [facility]: [phone]   

## 2019-01-05 NOTE — Progress Notes (Signed)
Histology and Location of Primary Cancer: Metastatic breast cancer - BRCA (+) - ER-/PR-/HER2-  Location(s) of Symptomatic tumor(s): Per PET 12/07/18:--Left iliac wing, max SUV 16.3, previously 5.0  --Medial left iliac bone, max SUV 4.5, new   Past/Anticipated chemotherapy by medical oncology, if any: Current Therapy:        Tecentriq/Avastin -- q 3 wk cycles --s/p cycle 8 - started 06/23/2018 Lynparza (Olapirib) 300 mg po BID -- start 06/23/2018 Radiation Therapy -- back and ribs - start on 07/21/2018 IV Iron as indicated Zometa 4 mg IV q 3 month -- next dose10/2020  Per [place], NP 12/30/18:  Impression and Plan: Sophia [name] is a very pleasant 29 yo caucasian female with metastatic breast cancer, HER 2 and ER negative, BRCA positive.  She continues to do well and will be following up with Dr. Sondra Come for palliative radiation of the pelvic tumor.  We will proceed with treatment today as planned.  CA 27.29 is pending.  We will refer her to Dr. Audelia Hives with plastic surgery.  We will see her back in another 3 weeks.   Patient's main complaints related to symptomatic tumor(s) are: She still has occasional numbness and tingling in left inner thigh.   Pain on a scale of 0-10 is: Pt reports constant pain that varies in intensity in left hip/pelvic area. Movement and weight bearing makes pain worse.   Ambulatory status? Walker? Wheelchair?: Pt with slight limp, steady gait. No assistive device.   SAFETY ISSUES: Prior radiation? Radiation treatment dates:   07/21/18-08/07/18  Site/dose:   1. Right ribs; 10 fractions of 3 Gy for a total of 30 Gy                     2. Lumbar spine; 14 fractions of 2.5 Gy for a total of 35 Gy                     3. Infraclavicular nodes; 14 fractions of 2.5 Gy for a total of 35 Gy  Beams/energy:   1. Photon Isodose; 10X                              2. Photon 3D; 15X                               3. Photon Isodose; 15X, 10X  Pacemaker/ICD?  No  Possible current pregnancy? No  Is the patient on methotrexate? No  Additional Complaints / other details:  Pt presents today for reconsult with Dr. Sondra Come for Radiation Oncology.   BP 113/87 (BP Location: Left Arm, Patient Position: Sitting)   Pulse 80   Temp 98 F (36.7 C) (Temporal)   Resp 16   Ht '5\' 9"'$  (1.753 m)   Wt 167 lb (75.8 kg)   SpO2 99%   BMI 24.66 kg/m   Wt Readings from Last 3 Encounters:  01/05/19 167 lb (75.8 kg)  12/30/18 165 lb (74.8 kg)  12/09/18 166 lb (75.3 kg)   Sophia Sousa, RN BSN

## 2019-01-05 NOTE — Progress Notes (Signed)
Radiation Oncology         (336) [phone] ________________________________  Name: Sophia [name] MRN: [id number]  Date: 01/05/2019  DOB: 04/07/1991  Re-Evaluation Note  CC: Sophia Lemons, PA  Volanda Napoleon, MD    ICD-10-CM   1. Breast cancer metastasized to bone, unspecified laterality [facility])  C50.919    C79.51     Diagnosis:   Metastatic Breast Cancer, BRCA+  Interval Since Last Radiation:  5 months 07/21/2018 - 08/07/2018:  1. Right Ribs / 30 Gy in 10 fractions 2. Lumbar Spine / 35 Gy in 14 fractions 3. Infraclavicular Nodes / 35 Gy in 14 fractions  Narrative:  The patient returns today for re-evaluation. She was last seen in our office in March 2020. She is currently being treated with Tecentriq/Avastin every 3 weeks under Dr. Marin Olp. She also takes [place] twice daily and receives Zometa every 3 months.   Since her last visit, she underwent repeat PET scan on 12/07/2018. Unfortunately, this revealed: mixed response of right chest wall and thoracic nodal metastases, although favored to be overall mildly progressive; mixed response of multifocal osseous metastases, although overall mildly progressive. She is specifically symptomatic in her pelvis, complicated by a prior pubic remus fracture. This is worsened somewhat after her hang[place]gliding vacation trip.  On review of systems, the patient reports occasional numbness and tingling in her left inner thigh, and constant pain to her left hip/pelvic area that varies in intensity. She notes movement and weight-bearing makes the pelvic pain worse. She denies any other symptoms.    Allergies:  has No Known Allergies.  Meds: Current Outpatient Medications  Medication Sig Dispense Refill  . atezolizumab (TECENTRIQ) 1200 MG/20ML Inject 20 mLs (1,200 mg total) into the vein every 21 ( twenty-one) days. Admix with 250 cc Normal Saline.  May administer with or without  .2 micron in line filter.  Not compatible with D5W 20 mL 12  . olaparib  (LYNPARZA) 150 MG tablet Take 2 tablets (300 mg total) by mouth 2 (two) times daily. Swallow whole. 120 tablet 2   No current facility-administered medications for this encounter.    Facility-Administered Medications Ordered in Other Encounters  Medication Dose Route Frequency Provider Last Rate Last Dose  . sodium chloride flush (NS) 0.9 % injection 10 mL  10 mL Intravenous PRN Volanda Napoleon, MD   10 mL at 06/11/18 1515    Physical Findings: The patient is in no acute distress. Patient is alert and oriented.  height is '5\' 9"'$  (1.753 m) and weight is 167 lb (75.8 kg). Her temporal temperature is 98 F (36.7 C). Her blood pressure is 113/87 and her pulse is 80. Her respiration is 16 and oxygen saturation is 99%.  No significant changes. Lungs are clear to auscultation bilaterally. Heart has regular rate and rhythm. No palpable cervical, supraclavicular, or axillary adenopathy. Abdomen soft, non-tender, normal bowel sounds. Patient has good strength in her lower extremities. She points to pain in the left groin area as her most bothersome area of pain.  Lab Findings: Lab Results  Component Value Date   WBC 4.1 12/30/2018   HGB 12.9 12/30/2018   HCT 39.1 12/30/2018   MCV 93.1 12/30/2018   PLT 82 (L) 12/30/2018    Radiographic Findings: Nm Pet Image Restag (ps) Skull Base To Thigh  Result Date: 12/07/2018 CLINICAL DATA:  Subsequent treatment strategy for metastatic breast cancer. EXAM: NUCLEAR MEDICINE PET SKULL BASE TO THIGH TECHNIQUE: 8.8 mCi F-18 FDG was injected intravenously. Full-ring  PET imaging was performed from the skull base to thigh after the radiotracer. CT data was obtained and used for attenuation correction and anatomic localization. Fasting blood glucose: 86 mg/dl COMPARISON:  PET-CT dated 09/10/2018 FINDINGS: Mediastinal blood pool activity: SUV max 2.1 Liver activity: SUV max NA NECK: No hypermetabolic cervical lymphadenopathy. Incidental CT findings: none CHEST:  Hypermetabolic thoracic lymphadenopathy, including: Hypermetabolism along the right superior mediastinal/prevascular region, max SUV 2.1 9 mm short axis medial left supraclavicular node at the thoracic inlet (series 4/image 47), max SUV 6.5, new 11 mm short axis right subpectoral node (series 4/image 58), max SUV 3.0, previously 3.7 5 mm short axis left IMA node (series 4/image 71), max SUV 4.4, previously 3.5 12 mm short axis right IMA node (series 4/image 79), max SUV 13.0, previously 5.8 1.3 x 3.5 cm lesion deep to the right breast prosthesis (series 4/image 83), max SUV 16.7, previously 15.3 No suspicious pulmonary nodules. Incidental CT findings: Right chest port terminates the cavoatrial junction. Bilateral breast prostheses. ABDOMEN/PELVIS: No abnormal hypermetabolism in the liver, spleen, pancreas, or adrenal glands. No suspicious abdominopelvic lymphadenopathy. Incidental CT findings: none SKELETON: Multifocal osseous metastases, including: --Sternum, max SUV 8.7, previously 4.3 --Left T7 vertebral body, max SUV 10.0, previously 9.1 --Right lateral 8th rib, max SUV 11.8, previously 13.0 --Left L3 lesion, max SUV 2.6, previously 6.4 --Left iliac wing, max SUV 16.3, previously 5.0 --Medial left iliac bone, max SUV 4.5, new --Left sacrum, max SUV 12.3, previously 17.0 --Left proximal humerus, max SUV 13.3, previously 6.2 --Left inferior pubic ramus, max SUV 19.2, previously 15.5 Incidental CT findings: none IMPRESSION: Mixed response of right chest wall and thoracic nodal metastases, although favored to be overall mildly progressive. Mixed response of multifocal osseous metastases, although overall mildly progressive. Electronically Signed   By: Julian Hy M.D.   On: 12/07/2018 15:13    Impression:  Metastatic Breast Cancer, BRCA+  Patient would be a good candidate for a short course of palliative radiation therapy directed at the left pubic ramus area, as well as left femoral head region. She is  not having any significant pain in the left iliac bone or sacroiliac region,  Where activity was seen on recent PET scan.  Plan:  Patient will be scheduled for CT simulation next week. Anticipate 10 treatments to the left pubic ramus and left femoral head region.  ____________________________________ Gery Pray, MD   This document serves as a record of services personally performed by Gery Pray, MD. It was created on his behalf by Wilburn Mylar, a trained medical scribe. The creation of this record is based on the scribe's personal observations and the provider's statements to them. This document has been checked and approved by the attending provider.

## 2019-01-13 ENCOUNTER — Ambulatory Visit
Admission: RE | Admit: 2019-01-13 | Discharge: 2019-01-13 | Disposition: A | Discharge status: Home / Self Care | Payer: PRIVATE HEALTH INSURANCE | Source: Ambulatory Visit | Attending: Radiation Oncology | Admitting: Radiation Oncology

## 2019-01-13 ENCOUNTER — Other Ambulatory Visit: Payer: Self-pay

## 2019-01-13 DIAGNOSIS — Z510 Encounter for antineoplastic radiation therapy: Secondary | ICD-10-CM | POA: Diagnosis not present

## 2019-01-17 DIAGNOSIS — Z510 Encounter for antineoplastic radiation therapy: Secondary | ICD-10-CM | POA: Diagnosis not present

## 2019-01-18 ENCOUNTER — Ambulatory Visit
Admission: RE | Admit: 2019-01-18 | Discharge: 2019-01-18 | Disposition: A | Discharge status: Home / Self Care | Payer: PRIVATE HEALTH INSURANCE | Source: Ambulatory Visit | Attending: Radiation Oncology | Admitting: Radiation Oncology

## 2019-01-18 ENCOUNTER — Other Ambulatory Visit: Payer: Self-pay

## 2019-01-18 DIAGNOSIS — C7951 Secondary malignant neoplasm of bone: Secondary | ICD-10-CM

## 2019-01-18 DIAGNOSIS — Z510 Encounter for antineoplastic radiation therapy: Secondary | ICD-10-CM | POA: Diagnosis not present

## 2019-01-18 DIAGNOSIS — C50919 Malignant neoplasm of unspecified site of unspecified female breast: Secondary | ICD-10-CM

## 2019-01-18 NOTE — Progress Notes (Signed)
  Radiation Oncology         (336) [phone] ________________________________  Name: Sophia [name] MRN: [id number]  Date: 01/18/2019  DOB: June 30, 1990  Simulation Verification Note    ICD-10-CM   1. Breast cancer metastasized to bone, unspecified laterality ([place])  C50.919    C79.51     Status: outpatient  NARRATIVE: The patient was brought to the treatment unit and placed in the planned treatment position. The clinical setup was verified. Then port films were obtained and uploaded to the radiation oncology medical record software.  The treatment beams were carefully compared against the planned radiation fields. The position location and shape of the radiation fields was reviewed. They targeted volume of tissue appears to be appropriately covered by the radiation beams. Organs at risk appear to be excluded as planned.  Based on my personal review, I approved the simulation verification. The patient's treatment will proceed as planned.  -----------------------------------  Blair Promise, PhD, MD

## 2019-01-19 ENCOUNTER — Other Ambulatory Visit: Payer: Self-pay

## 2019-01-19 ENCOUNTER — Ambulatory Visit
Admission: RE | Admit: 2019-01-19 | Discharge: 2019-01-19 | Disposition: A | Discharge status: Home / Self Care | Payer: PRIVATE HEALTH INSURANCE | Source: Ambulatory Visit | Attending: Radiation Oncology | Admitting: Radiation Oncology

## 2019-01-19 DIAGNOSIS — Z510 Encounter for antineoplastic radiation therapy: Secondary | ICD-10-CM | POA: Diagnosis not present

## 2019-01-20 ENCOUNTER — Inpatient Hospital Stay: Payer: PRIVATE HEALTH INSURANCE

## 2019-01-20 ENCOUNTER — Inpatient Hospital Stay (HOSPITAL_BASED_OUTPATIENT_CLINIC_OR_DEPARTMENT_OTHER): Payer: PRIVATE HEALTH INSURANCE | Admitting: Hematology & Oncology

## 2019-01-20 ENCOUNTER — Ambulatory Visit: Payer: PRIVATE HEALTH INSURANCE | Admitting: Radiation Oncology

## 2019-01-20 ENCOUNTER — Other Ambulatory Visit: Payer: Self-pay | Admitting: *Deleted

## 2019-01-20 ENCOUNTER — Encounter: Payer: Self-pay | Admitting: Hematology & Oncology

## 2019-01-20 ENCOUNTER — Other Ambulatory Visit: Payer: Self-pay

## 2019-01-20 ENCOUNTER — Ambulatory Visit
Admission: RE | Admit: 2019-01-20 | Discharge: 2019-01-20 | Disposition: A | Discharge status: Home / Self Care | Payer: PRIVATE HEALTH INSURANCE | Source: Ambulatory Visit | Attending: Radiation Oncology | Admitting: Radiation Oncology

## 2019-01-20 VITALS — BP 105/75 | HR 90 | Temp 97.3°F | Resp 16 | Wt 163.0 lb

## 2019-01-20 DIAGNOSIS — C50919 Malignant neoplasm of unspecified site of unspecified female breast: Secondary | ICD-10-CM

## 2019-01-20 DIAGNOSIS — C50111 Malignant neoplasm of central portion of right female breast: Secondary | ICD-10-CM

## 2019-01-20 DIAGNOSIS — D500 Iron deficiency anemia secondary to blood loss (chronic): Secondary | ICD-10-CM

## 2019-01-20 DIAGNOSIS — C7951 Secondary malignant neoplasm of bone: Secondary | ICD-10-CM

## 2019-01-20 DIAGNOSIS — Z510 Encounter for antineoplastic radiation therapy: Secondary | ICD-10-CM | POA: Diagnosis not present

## 2019-01-20 LAB — CMP (CANCER CENTER ONLY)
ALT: 36 U/L (ref 0–44)
AST: 45 U/L — ABNORMAL HIGH (ref 15–41)
Albumin: 4.1 g/dL (ref 3.5–5.0)
Alkaline Phosphatase: 170 U/L — ABNORMAL HIGH (ref 38–126)
Anion gap: 8 (ref 5–15)
BUN: 16 mg/dL (ref 6–20)
CO2: 26 mmol/L (ref 22–32)
Calcium: 9.7 mg/dL (ref 8.9–10.3)
Chloride: 102 mmol/L (ref 98–111)
Creatinine: 0.65 mg/dL (ref 0.44–1.00)
GFR, Est AFR Am: >60 mL/min (ref >60)
GFR, Estimated: >60 mL/min (ref >60)
Glucose, Bld: 89 mg/dL (ref 70–99)
Potassium: 4.1 mmol/L (ref 3.5–5.1)
Sodium: 136 mmol/L (ref 135–145)
Total Bilirubin: 0.6 mg/dL (ref 0.3–1.2)
Total Protein: 7.7 g/dL (ref 6.5–8.1)

## 2019-01-20 LAB — CBC WITH DIFFERENTIAL (CANCER CENTER ONLY)
Abs Immature Granulocytes: 0.01 10*3/uL (ref 0.00–0.07)
Basophils Absolute: 0 10*3/uL (ref 0.0–0.1)
Basophils Relative: 0 %
Eosinophils Absolute: 0.6 10*3/uL — ABNORMAL HIGH (ref 0.0–0.5)
Eosinophils Relative: 12 %
HCT: 39.5 % (ref 36.0–46.0)
Hemoglobin: 13.2 g/dL (ref 12.0–15.0)
Immature Granulocytes: 0 %
Lymphocytes Relative: 15 %
Lymphs Abs: 0.7 10*3/uL (ref 0.7–4.0)
MCH: 30.7 pg (ref 26.0–34.0)
MCHC: 33.4 g/dL (ref 30.0–36.0)
MCV: 91.9 fL (ref 80.0–100.0)
Monocytes Absolute: 0.6 10*3/uL (ref 0.1–1.0)
Monocytes Relative: 11 %
Neutro Abs: 3 10*3/uL (ref 1.7–7.7)
Neutrophils Relative %: 62 %
Platelet Count: 87 10*3/uL — ABNORMAL LOW (ref 150–400)
RBC: 4.3 MIL/uL (ref 3.87–5.11)
RDW: 12.6 % (ref 11.5–15.5)
WBC Count: 4.9 10*3/uL (ref 4.0–10.5)
nRBC: 0 % (ref 0.0–0.2)

## 2019-01-20 MED ORDER — SODIUM CHLORIDE 0.9% FLUSH
10.0000 mL | INTRAVENOUS | Status: DC | PRN
  Administered 2019-01-20: 14:00:00 10 mL
  Filled 2019-01-20: qty 10

## 2019-01-20 MED ORDER — SODIUM CHLORIDE 0.9 % IV SOLN
15.0000 mg/kg | Freq: Once | INTRAVENOUS | Status: AC
  Administered 2019-01-20: 13:00:00 1100 mg via INTRAVENOUS
  Filled 2019-01-20: qty 44

## 2019-01-20 MED ORDER — HEPARIN SOD (PORK) LOCK FLUSH 100 UNIT/ML IV SOLN
500.0000 [IU] | Freq: Once | INTRAVENOUS | Status: AC | PRN
  Administered 2019-01-20: 14:00:00 500 [IU]
  Filled 2019-01-20: qty 5

## 2019-01-20 MED ORDER — SODIUM CHLORIDE 0.9 % IV SOLN
1200.0000 mg | Freq: Once | INTRAVENOUS | Status: AC
  Administered 2019-01-20: 13:00:00 1200 mg via INTRAVENOUS
  Filled 2019-01-20: qty 20

## 2019-01-20 MED ORDER — SODIUM CHLORIDE 0.9 % IV SOLN
Freq: Once | INTRAVENOUS | Status: AC
  Administered 2019-01-20: 12:00:00 via INTRAVENOUS
  Filled 2019-01-20: qty 250

## 2019-01-20 MED ORDER — OLAPARIB 150 MG PO TABS: 300.0000 mg | ORAL_TABLET | Freq: Two times a day (BID) | ORAL | 2 refills | Status: DC

## 2019-01-20 NOTE — Patient Instructions (Signed)
Implanted Port Insertion, Care After This sheet gives you information about how to care for yourself after your procedure. Your health care provider may also give you more specific instructions. If you have problems or questions, contact your health care provider. What can I expect after the procedure? After the procedure, it is common to have:  Discomfort at the port insertion site.  Bruising on the skin over the port. This should improve over 3-4 days. Follow these instructions at home: Port care  After your port is placed, you will get a manufacturer's information card. The card has information about your port. Keep this card with you at all times.  Take care of the port as told by your health care provider. Ask your health care provider if you or a family member can get training for taking care of the port at home. A home health care nurse may also take care of the port.  Make sure to remember what type of port you have. Incision care      Follow instructions from your health care provider about how to take care of your port insertion site. Make sure you: ? Wash your hands with soap and water before and after you change your bandage (dressing). If soap and water are not available, use hand sanitizer. ? Change your dressing as told by your health care provider. ? Leave stitches (sutures), skin glue, or adhesive strips in place. These skin closures may need to stay in place for 2 weeks or longer. If adhesive strip edges start to loosen and curl up, you may trim the loose edges. Do not remove adhesive strips completely unless your health care provider tells you to do that.  Check your port insertion site every day for signs of infection. Check for: ? Redness, swelling, or pain. ? Fluid or blood. ? Warmth. ? Pus or a bad smell. Activity  Return to your normal activities as told by your health care provider. Ask your health care provider what activities are safe for you.  Do not  lift anything that is heavier than 10 lb (4.5 kg), or the limit that you are told, until your health care provider says that it is safe. General instructions  Take over-the-counter and prescription medicines only as told by your health care provider.  Do not take baths, swim, or use a hot tub until your health care provider approves. Ask your health care provider if you may take showers. You may only be allowed to take sponge baths.  Do not drive for 24 hours if you were given a sedative during your procedure.  Wear a medical alert bracelet in case of an emergency. This will tell any health care providers that you have a port.  Keep all follow-up visits as told by your health care provider. This is important. Contact a health care provider if:  You cannot flush your port with saline as directed, or you cannot draw blood from the port.  You have a fever or chills.  You have redness, swelling, or pain around your port insertion site.  You have fluid or blood coming from your port insertion site.  Your port insertion site feels warm to the touch.  You have pus or a bad smell coming from the port insertion site. Get help right away if:  You have chest pain or shortness of breath.  You have bleeding from your port that you cannot control. Summary  Take care of the port as told by your health   care provider. Keep the manufacturer's information card with you at all times.  Change your dressing as told by your health care provider.  Contact a health care provider if you have a fever or chills or if you have redness, swelling, or pain around your port insertion site.  Keep all follow-up visits as told by your health care provider. This information is not intended to replace advice given to you by your health care provider. Make sure you discuss any questions you have with your health care provider. Document Released: 03/02/2013 Document Revised: 12/08/2017 Document Reviewed: 12/08/2017  Elsevier Patient Education  2020 [employer]vier Inc.  

## 2019-01-20 NOTE — Patient Instructions (Signed)
[facility] Discharge Instructions for Patients Receiving Chemotherapy  Today you received the following chemotherapy agents Tecentriq, Avastin  To help prevent nausea and vomiting after your treatment, we encourage you to take your nausea medication    If you develop nausea and vomiting that is not controlled by your nausea medication, call the clinic.   BELOW ARE SYMPTOMS THAT SHOULD BE REPORTED IMMEDIATELY:  *FEVER GREATER THAN 100.5 F  *CHILLS WITH OR WITHOUT FEVER  NAUSEA AND VOMITING THAT IS NOT CONTROLLED WITH YOUR NAUSEA MEDICATION  *UNUSUAL SHORTNESS OF BREATH  *UNUSUAL BRUISING OR BLEEDING  TENDERNESS IN MOUTH AND THROAT WITH OR WITHOUT PRESENCE OF ULCERS  *URINARY PROBLEMS  *BOWEL PROBLEMS  UNUSUAL RASH Items with * indicate a potential emergency and should be followed up as soon as possible.  Feel free to call the clinic should you have any questions or concerns. The clinic phone number is [phone].  Please show the CHEMO ALERT CARD at check-in to the Emergency Department and triage nurse.   

## 2019-01-20 NOTE — Progress Notes (Signed)
Ok to treat with platelets of 87,000

## 2019-01-20 NOTE — Progress Notes (Signed)
Hematology and Oncology Follow Up Visit  Reatha Sur [id number] 11-10-1990 28 y.o. 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 Magnussen is here today for follow-up.  She is still doing quite well.  She is getting radiation therapy to the left pelvic area.  She is had 3 treatments so far.  She has had no issues with nausea or vomiting.  She is doing well on the [place]  She still like to have reconstructive surgery for the breast.  We will have to see about getting her back to plastic surgery for this.  She has had no issues with cough.  She has had no problems with rashes.  There is been no leg swelling.  She has had no fever.  She has had no bleeding.  There is no problems with bowels or bladder.  She still is in menopause.  I think her last estradiol level was 2.  Overall, her performance status is ECOG 0.   Medications:  Allergies as of 01/20/2019   No Known Allergies     Medication List       Accurate as of January 20, 2019 11:36 AM. If you have any questions, ask your nurse or doctor.        olaparib 150 MG tablet Commonly known as: LYNPARZA Take 2 tablets (300 mg total) by mouth 2 (two) times daily. Swallow whole.   Tecentriq 1200 MG/20ML Generic drug: atezolizumab Inject 20 mLs  (1,200 mg total) into the vein every 21 ( twenty-one) days. Admix with 250 cc Normal Saline.  May administer with or without  .2 micron in line filter.  Not compatible with D5W       Allergies: No Known Allergies  Past Medical History, Surgical history, Social history, and Family History were reviewed and updated.  Review of Systems: Review of Systems  Constitutional: Negative.   HENT: Negative.   Eyes: Negative.   Respiratory: Negative.   Cardiovascular: Negative.   Gastrointestinal: Negative.   Genitourinary: Negative.   Musculoskeletal: Positive for back pain.  Skin: Negative.   Neurological: Negative.   Endo/Heme/Allergies: Negative.   Psychiatric/Behavioral: Negative.      Physical Exam:  weight is 163 lb (73.9 kg). Her temporal temperature is 97.3 F (36.3 C) (abnormal). Her blood pressure is 105/75 and her pulse is 90. Her respiration is 16 and oxygen saturation is 99%.   Wt Readings from Last 3 Encounters:  01/20/19 163 lb (73.9 kg)  01/05/19 167 lb (75.8 kg)  12/30/18 165 lb (74.8 kg)    Physical Exam Vitals signs reviewed.  Constitutional:      Comments: Breast exam shows bilateral mastectomies with implants.  These are well-healed.  There is no erythema.  There is no warmth.  There is no palpable axillary lymph nodes.  She does have some fullness  in the right axilla.  HENT:     Head: Normocephalic and atraumatic.  Eyes:     Pupils: Pupils are equal, round, and reactive to light.  Neck:     Musculoskeletal: Normal range of motion.     Comments: She has some improvement in the fullness in the right cervical and supraclavicular region.   Cardiovascular:     Rate and Rhythm: Normal rate and regular rhythm.     Heart sounds: Normal heart sounds.  Pulmonary:     Effort: Pulmonary effort is normal.     Breath sounds: Normal breath sounds.  Abdominal:     General: Bowel sounds are normal.     Palpations: Abdomen is soft.  Musculoskeletal: Normal range of  motion.        General: No tenderness or deformity.     Comments: Back exam shows no tenderness over the spine.  There may be some slight spasms in the right lumbar paraspinal muscles.  There is some tenderness along the right flank.  Lymphadenopathy:     Cervical: No cervical adenopathy.  Skin:    General: Skin is warm and dry.     Findings: No erythema or rash.  Neurological:     Mental Status: She is alert and oriented to person, place, and time.  Psychiatric:        Behavior: Behavior normal.        Thought Content: Thought content normal.        Judgment: Judgment normal.      Lab Results  Component Value Date   WBC 4.9 01/20/2019   HGB 13.2 01/20/2019   HCT 39.5 01/20/2019   MCV 91.9 01/20/2019   PLT 87 (L) 01/20/2019   Lab Results  Component Value Date   FERRITIN 218 11/15/2018   IRON 61 11/15/2018   TIBC 251 11/15/2018   UIBC 190 11/15/2018   IRONPCTSAT 24 11/15/2018   Lab Results  Component Value Date   RBC 4.30 01/20/2019   No results found for: KPAFRELGTCHN, LAMBDASER, KAPLAMBRATIO No results found for: IGGSERUM, IGA, IGMSERUM No results found for: Odetta Pink, SPEI   Chemistry      Component Value Date/Time   NA 136 01/20/2019 1030   K 4.1 01/20/2019 1030   CL 102 01/20/2019 1030   CO2 26 01/20/2019 1030   BUN 16 01/20/2019 1030   CREATININE 0.65 01/20/2019 1030      Component Value Date/Time   CALCIUM 9.7 01/20/2019 1030   ALKPHOS 170 (H) 01/20/2019 1030   AST 45 (H) 01/20/2019 1030   ALT 36 01/20/2019 1030   BILITOT 0.6 01/20/2019 1030       Impression and Plan: Ms. Tino is a very pleasant 28 yo caucasian female with locally advanced stage III ductal carcinoma of the right breast, HER-2- and ER negative. She is BRCA positive.   I do think that the response so far has been adequate.  I think that her quality of life is better.  She is working.  She is able to enjoy what she likes to  do.  She actually ordered lunch for herself today.  She went to go pick it up.  I am very impressed with her tech savvy.  She will complete her radiation therapy in a week or so.  I hope this will help.  I do not think she is due for another PET scan probably until late September or early October.  We will plan  to get her back to see [place] in another 3 weeks.Marland Kitchen       Volanda Napoleon, MD 8/27/202011:36 AM

## 2019-01-21 ENCOUNTER — Other Ambulatory Visit: Payer: Self-pay

## 2019-01-21 ENCOUNTER — Ambulatory Visit: Admission: RE | Admit: 2019-01-21 | Payer: PRIVATE HEALTH INSURANCE | Source: Ambulatory Visit

## 2019-01-21 ENCOUNTER — Ambulatory Visit: Payer: PRIVATE HEALTH INSURANCE

## 2019-01-21 ENCOUNTER — Telehealth: Payer: Self-pay | Admitting: Pharmacy Technician

## 2019-01-21 LAB — IRON AND TIBC
Iron: 59 ug/dL (ref 41–142)
Saturation Ratios: 25 % (ref 21–57)
TIBC: 235 ug/dL — ABNORMAL LOW (ref 236–444)
UIBC: 176 ug/dL (ref 120–384)

## 2019-01-21 LAB — FERRITIN: Ferritin: 332 ng/mL — ABNORMAL HIGH (ref 11–307)

## 2019-01-21 LAB — CANCER ANTIGEN 27.29: CA 27.29: 15.6 U/mL (ref 0.0–38.6)

## 2019-01-21 NOTE — Telephone Encounter (Signed)
Oral Oncology Patient Advocate Encounter  Called and left voicemail for patient to return my call.    Virgina Evener script sent to [facility].  Since patient was not active with [employer] any longer, I was calling to have her set back up to receive delivery of Lynparza.  [facility] Phone [phone] Fax [phone] 01/21/2019 3:01 PM

## 2019-01-24 ENCOUNTER — Ambulatory Visit
Admission: RE | Admit: 2019-01-24 | Discharge: 2019-01-24 | Disposition: A | Discharge status: Home / Self Care | Payer: PRIVATE HEALTH INSURANCE | Source: Ambulatory Visit | Attending: Radiation Oncology | Admitting: Radiation Oncology

## 2019-01-24 ENCOUNTER — Other Ambulatory Visit: Payer: Self-pay

## 2019-01-24 DIAGNOSIS — Z510 Encounter for antineoplastic radiation therapy: Secondary | ICD-10-CM | POA: Diagnosis not present

## 2019-01-25 ENCOUNTER — Encounter: Payer: Self-pay | Admitting: Plastic Surgery

## 2019-01-25 ENCOUNTER — Ambulatory Visit
Admission: RE | Admit: 2019-01-25 | Discharge: 2019-01-25 | Disposition: A | Discharge status: Home / Self Care | Payer: PRIVATE HEALTH INSURANCE | Source: Ambulatory Visit | Attending: Radiation Oncology | Admitting: Radiation Oncology

## 2019-01-25 ENCOUNTER — Other Ambulatory Visit: Payer: Self-pay

## 2019-01-25 ENCOUNTER — Ambulatory Visit (INDEPENDENT_AMBULATORY_CARE_PROVIDER_SITE_OTHER): Payer: PRIVATE HEALTH INSURANCE | Admitting: Plastic Surgery

## 2019-01-25 VITALS — BP 110/80 | HR 67 | Temp 97.5°F | Ht 69.0 in | Wt 161.0 lb

## 2019-01-25 DIAGNOSIS — C50111 Malignant neoplasm of central portion of right female breast: Secondary | ICD-10-CM | POA: Diagnosis not present

## 2019-01-25 DIAGNOSIS — D509 Iron deficiency anemia, unspecified: Secondary | ICD-10-CM | POA: Insufficient documentation

## 2019-01-25 DIAGNOSIS — C50919 Malignant neoplasm of unspecified site of unspecified female breast: Secondary | ICD-10-CM

## 2019-01-25 DIAGNOSIS — C50911 Malignant neoplasm of unspecified site of right female breast: Secondary | ICD-10-CM | POA: Diagnosis not present

## 2019-01-25 DIAGNOSIS — Z510 Encounter for antineoplastic radiation therapy: Secondary | ICD-10-CM | POA: Insufficient documentation

## 2019-01-25 DIAGNOSIS — Z1509 Genetic susceptibility to other malignant neoplasm: Secondary | ICD-10-CM

## 2019-01-25 DIAGNOSIS — Z1501 Genetic susceptibility to malignant neoplasm of breast: Secondary | ICD-10-CM | POA: Diagnosis not present

## 2019-01-25 DIAGNOSIS — Z171 Estrogen receptor negative status [ER-]: Secondary | ICD-10-CM

## 2019-01-25 DIAGNOSIS — Z5111 Encounter for antineoplastic chemotherapy: Secondary | ICD-10-CM | POA: Insufficient documentation

## 2019-01-25 DIAGNOSIS — C7951 Secondary malignant neoplasm of bone: Secondary | ICD-10-CM

## 2019-01-25 NOTE — Telephone Encounter (Signed)
Tried calling patient again.  Left voicemail to return my call.

## 2019-01-25 NOTE — Progress Notes (Signed)
Patient ID: Sophia [name], female    DOB: [birth year], 28 y.o.   MRN: [id number]   Chief Complaint  Patient presents with  . Breast Problem    The patient is a 28 year old female here for consultation for breast reconstruction.  She has a very strong history of breast cancer.  She is currently undergoing treatment with her grandmother.  She is BRCA positive.  She was diagnosed with breast cancer a year ago and underwent bilateral mastectomies.  She had expanders and then textured silicone implants placed by Dr. Iran Planas.  The patient states that all along she had planned and preferred autologous reconstruction.  Now that she is past the reconstruction she is still wanting autologous reconstruction and is fully aware of her metastatic status (bone).  She was referred to me by the oncologist who treats several family members of hers.  She receives [employer].  She is 5 feet 9 inches tall and weighs 161 pounds.  She is otherwise in good health.  She currently has Natrelle Inspira Smooth Round Extra Projection 470 ml implants placed bilateral. REF SRX-470 RIGHT SN [id number] LEFT SN [id number].  She is also not happy with the asymmetry.  The reconstruction is reasonable but could be improved.  She also wants to have NAC reconstruction.    Review of Systems  Constitutional: Negative for activity change and appetite change.  HENT: Negative.   Eyes: Negative.   Respiratory: Negative for chest tightness and shortness of breath.   Cardiovascular: Negative for leg swelling.  Gastrointestinal: Negative for abdominal distention and abdominal pain.  Endocrine: Negative.   Genitourinary: Negative.   Musculoskeletal: Negative for arthralgias and back pain.  Skin: Negative.   Neurological: Negative.   Hematological: Negative.   Psychiatric/Behavioral: Negative.     Past Medical History:  Diagnosis Date  . BRCA1 positive   . Breast cancer ([place]) 05/2017   +BRCA1  . Breast cancer  metastasized to bone, unspecified laterality ([place]) 05/17/2018  . Depression    no meds  . Family history of breast cancer   . History of breast cancer    right  . History of chemotherapy    finished 08/21/2017  . Port-A-Cath in place     Past Surgical History:  Procedure Laterality Date  . BREAST RECONSTRUCTION WITH PLACEMENT OF TISSUE EXPANDER AND FLEX HD (ACELLULAR HYDRATED DERMIS) Bilateral 10/09/2017   Procedure: BILATERAL BREAST RECONSTRUCTION WITH PLACEMENT OF TISSUE EXPANDER AND FLEX HD (ACELLULAR HYDRATED DERMIS);  Surgeon: [name], MD;  Location: [facility];  Service: Plastics;  Laterality: Bilateral;  . HYSTEROSCOPY W/D&C N/A 01/19/2018   Procedure: DILATATION & CURETTAGE/HYSTEROSCOPY;  Surgeon: Megan Salon, MD;  Location: [facility];  Service: Gynecology;  Laterality: N/A;  . IR FLUORO GUIDE PORT INSERTION RIGHT  06/18/2017  . IR US GUIDE VASC ACCESS RIGHT  06/18/2017  . LAPAROSCOPIC BILATERAL SALPINGO OOPHERECTOMY Bilateral 01/19/2018   Procedure: LAPAROSCOPIC BILATERAL SALPINGO OOPHORECTOMY;  Surgeon: Megan Salon, MD;  Location: [facility];  Service: Gynecology;  Laterality: Bilateral;  . MASTECTOMY MODIFIED RADICAL Right 10/09/2017   Procedure: MASTECTOMY MODIFIED RADICAL;  Surgeon: Fanny Skates, MD;  Location: [facility];  Service: General;  Laterality: Right;  . REMOVAL OF BILATERAL TISSUE EXPANDERS WITH PLACEMENT OF BILATERAL BREAST IMPLANTS Bilateral 03/30/2018   Procedure: REMOVAL OF BILATERAL TISSUE EXPANDERS WITH PLACEMENT OF BILATERAL BREAST IMPLANTS;  Surgeon: [name], MD;  Location: [facility];  Service: Plastics;  Laterality: Bilateral;  . TOTAL  MASTECTOMY Left 10/09/2017   Procedure: LEFT PROPHYLATIC MASTECTOMY;  Surgeon: Fanny Skates, MD;  Location: [facility];  Service: General;  Laterality: Left;      Current Outpatient Medications:  .  atezolizumab (TECENTRIQ) 1200 MG/20ML, Inject 20  mLs (1,200 mg total) into the vein every 21 ( twenty-one) days. Admix with 250 cc Normal Saline.  May administer with or without  .2 micron in line filter.  Not compatible with D5W, Disp: 20 mL, Rfl: 12 .  olaparib (LYNPARZA) 150 MG tablet, Take 2 tablets (300 mg total) by mouth 2 (two) times daily. Swallow whole., Disp: 120 tablet, Rfl: 2 No current facility-administered medications for this visit.   Facility-Administered Medications Ordered in Other Visits:  .  sodium chloride flush (NS) 0.9 % injection 10 mL, 10 mL, Intravenous, PRN, Volanda Napoleon, MD, 10 mL at 06/11/18 1515   Objective:   Vitals:   01/25/19 1139  BP: 110/80  Pulse: 67  Temp: (!) 97.5 F (36.4 C)  SpO2: 99%    Physical Exam Vitals signs and nursing note reviewed.  Constitutional:      Appearance: Normal appearance.  HENT:     Head: Normocephalic and atraumatic.  Eyes:     Extraocular Movements: Extraocular movements intact.  Cardiovascular:     Rate and Rhythm: Normal rate.     Pulses: Normal pulses.  Pulmonary:     Effort: Pulmonary effort is normal.  Abdominal:     General: Abdomen is flat. There is no distension.     Tenderness: There is no abdominal tenderness.  Musculoskeletal: Normal range of motion.  Skin:    General: Skin is warm.     Capillary Refill: Capillary refill takes less than 2 seconds.  Neurological:     General: No focal deficit present.     Mental Status: She is oriented to person, place, and time.  Psychiatric:        Mood and Affect: Mood normal.        Behavior: Behavior normal.     Assessment & Plan:  Malignant neoplasm of central portion of right breast in female, estrogen receptor negative (HCC)  BRCA1 positive  Breast cancer metastasized to bone, unspecified laterality ([place])  Recommend referral for autologous reconstruction.  Patient very happy with the idea.  I will set that up for her. Pictures were obtained of the patient and placed in the chart with the  patient's or guardian's permission.   [place], DO

## 2019-01-26 ENCOUNTER — Ambulatory Visit
Admission: RE | Admit: 2019-01-26 | Discharge: 2019-01-26 | Disposition: A | Discharge status: Home / Self Care | Payer: PRIVATE HEALTH INSURANCE | Source: Ambulatory Visit | Attending: Radiation Oncology | Admitting: Radiation Oncology

## 2019-01-26 ENCOUNTER — Other Ambulatory Visit: Payer: Self-pay

## 2019-01-26 DIAGNOSIS — Z510 Encounter for antineoplastic radiation therapy: Secondary | ICD-10-CM | POA: Diagnosis not present

## 2019-01-27 ENCOUNTER — Ambulatory Visit
Admission: RE | Admit: 2019-01-27 | Discharge: 2019-01-27 | Disposition: A | Discharge status: Home / Self Care | Payer: PRIVATE HEALTH INSURANCE | Source: Ambulatory Visit | Attending: Radiation Oncology | Admitting: Radiation Oncology

## 2019-01-27 ENCOUNTER — Other Ambulatory Visit: Payer: Self-pay

## 2019-01-27 DIAGNOSIS — Z510 Encounter for antineoplastic radiation therapy: Secondary | ICD-10-CM | POA: Diagnosis not present

## 2019-01-27 NOTE — Telephone Encounter (Addendum)
Third attempt at reaching patient to set up delivery of Lynparza.  Had to leave a voicemail.  I have left 3 voicemails for the patient and have not heard back from her.

## 2019-01-28 ENCOUNTER — Other Ambulatory Visit: Payer: Self-pay

## 2019-01-28 ENCOUNTER — Ambulatory Visit
Admission: RE | Admit: 2019-01-28 | Discharge: 2019-01-28 | Disposition: A | Discharge status: Home / Self Care | Payer: PRIVATE HEALTH INSURANCE | Source: Ambulatory Visit | Attending: Radiation Oncology | Admitting: Radiation Oncology

## 2019-01-28 DIAGNOSIS — Z510 Encounter for antineoplastic radiation therapy: Secondary | ICD-10-CM | POA: Diagnosis not present

## 2019-02-01 ENCOUNTER — Other Ambulatory Visit: Payer: Self-pay

## 2019-02-01 ENCOUNTER — Ambulatory Visit: Payer: PRIVATE HEALTH INSURANCE

## 2019-02-01 ENCOUNTER — Ambulatory Visit
Admission: RE | Admit: 2019-02-01 | Discharge: 2019-02-01 | Disposition: A | Discharge status: Home / Self Care | Payer: PRIVATE HEALTH INSURANCE | Source: Ambulatory Visit | Attending: Radiation Oncology | Admitting: Radiation Oncology

## 2019-02-01 DIAGNOSIS — Z510 Encounter for antineoplastic radiation therapy: Secondary | ICD-10-CM | POA: Diagnosis not present

## 2019-02-01 NOTE — Telephone Encounter (Signed)
Sophia [name] returned my call.  I have spoke with her and shipment of medication will be scheduled after she completes call with [name].

## 2019-02-01 NOTE — Telephone Encounter (Addendum)
Oral Oncology Patient Advocate Encounter  Left voicemail on patients phone.  This was my final attempt to reach the patient to set up shipment of medication.  I left my phone number to return my call if she wants to refill Lynparza.  Also stated I would let the office know of the attempts that I have made to reach the patient with no response.  Patient will be inactivated in [place].  [facility] [place] Phone [phone] Fax [phone] 02/01/2019 1:15 PM

## 2019-02-02 ENCOUNTER — Other Ambulatory Visit: Payer: Self-pay

## 2019-02-02 ENCOUNTER — Ambulatory Visit
Admission: RE | Admit: 2019-02-02 | Discharge: 2019-02-02 | Disposition: A | Discharge status: Home / Self Care | Payer: PRIVATE HEALTH INSURANCE | Source: Ambulatory Visit | Attending: Radiation Oncology | Admitting: Radiation Oncology

## 2019-02-02 ENCOUNTER — Encounter: Payer: Self-pay | Admitting: Radiation Oncology

## 2019-02-02 ENCOUNTER — Ambulatory Visit: Payer: PRIVATE HEALTH INSURANCE

## 2019-02-02 DIAGNOSIS — Z510 Encounter for antineoplastic radiation therapy: Secondary | ICD-10-CM | POA: Diagnosis not present

## 2019-02-03 ENCOUNTER — Ambulatory Visit: Payer: PRIVATE HEALTH INSURANCE

## 2019-02-03 MED FILL — LYNPARZA 150 MG TABLET: 150 | 30 days supply | Qty: 120 | Fill #0

## 2019-02-03 NOTE — Telephone Encounter (Signed)
Patient has spoke with Sophia [name] for education and medication has been set to fill 9/10 for delivery 9/11.

## 2019-02-07 ENCOUNTER — Encounter: Payer: Self-pay | Admitting: Hematology & Oncology

## 2019-02-07 ENCOUNTER — Telehealth: Payer: Self-pay | Admitting: *Deleted

## 2019-02-08 NOTE — Telephone Encounter (Signed)
No changes at this time to pt rx

## 2019-02-10 ENCOUNTER — Encounter: Payer: Self-pay | Admitting: Family

## 2019-02-10 ENCOUNTER — Inpatient Hospital Stay: Payer: PRIVATE HEALTH INSURANCE

## 2019-02-10 ENCOUNTER — Other Ambulatory Visit: Payer: Self-pay

## 2019-02-10 ENCOUNTER — Inpatient Hospital Stay: Payer: PRIVATE HEALTH INSURANCE | Attending: Hematology & Oncology | Admitting: Family

## 2019-02-10 VITALS — BP 121/93 | HR 103 | Resp 17 | Ht 69.0 in | Wt 161.0 lb

## 2019-02-10 DIAGNOSIS — R1011 Right upper quadrant pain: Secondary | ICD-10-CM

## 2019-02-10 DIAGNOSIS — C7951 Secondary malignant neoplasm of bone: Secondary | ICD-10-CM | POA: Insufficient documentation

## 2019-02-10 DIAGNOSIS — M546 Pain in thoracic spine: Secondary | ICD-10-CM | POA: Diagnosis not present

## 2019-02-10 DIAGNOSIS — G8929 Other chronic pain: Secondary | ICD-10-CM

## 2019-02-10 DIAGNOSIS — C50919 Malignant neoplasm of unspecified site of unspecified female breast: Secondary | ICD-10-CM

## 2019-02-10 DIAGNOSIS — Z5112 Encounter for antineoplastic immunotherapy: Secondary | ICD-10-CM | POA: Insufficient documentation

## 2019-02-10 DIAGNOSIS — C50111 Malignant neoplasm of central portion of right female breast: Secondary | ICD-10-CM

## 2019-02-10 DIAGNOSIS — C50911 Malignant neoplasm of unspecified site of right female breast: Secondary | ICD-10-CM | POA: Insufficient documentation

## 2019-02-10 DIAGNOSIS — Z95828 Presence of other vascular implants and grafts: Secondary | ICD-10-CM

## 2019-02-10 DIAGNOSIS — Z171 Estrogen receptor negative status [ER-]: Secondary | ICD-10-CM

## 2019-02-10 DIAGNOSIS — R112 Nausea with vomiting, unspecified: Secondary | ICD-10-CM

## 2019-02-10 LAB — CMP (CANCER CENTER ONLY)
ALT: 32 U/L (ref 0–44)
AST: 44 U/L — ABNORMAL HIGH (ref 15–41)
Albumin: 4 g/dL (ref 3.5–5.0)
Alkaline Phosphatase: 148 U/L — ABNORMAL HIGH (ref 38–126)
Anion gap: 9 (ref 5–15)
BUN: 15 mg/dL (ref 6–20)
CO2: 27 mmol/L (ref 22–32)
Calcium: 10.1 mg/dL (ref 8.9–10.3)
Chloride: 101 mmol/L (ref 98–111)
Creatinine: 0.73 mg/dL (ref 0.44–1.00)
GFR, Est AFR Am: >60 mL/min (ref >60)
GFR, Estimated: >60 mL/min (ref >60)
Glucose, Bld: 122 mg/dL — ABNORMAL HIGH (ref 70–99)
Potassium: 4 mmol/L (ref 3.5–5.1)
Sodium: 137 mmol/L (ref 135–145)
Total Bilirubin: 0.7 mg/dL (ref 0.3–1.2)
Total Protein: 7.5 g/dL (ref 6.5–8.1)

## 2019-02-10 LAB — CBC WITH DIFFERENTIAL (CANCER CENTER ONLY)
Abs Immature Granulocytes: 0.01 10*3/uL (ref 0.00–0.07)
Basophils Absolute: 0 10*3/uL (ref 0.0–0.1)
Basophils Relative: 1 %
Eosinophils Absolute: 0.6 10*3/uL — ABNORMAL HIGH (ref 0.0–0.5)
Eosinophils Relative: 14 %
HCT: 39.1 % (ref 36.0–46.0)
Hemoglobin: 13.1 g/dL (ref 12.0–15.0)
Immature Granulocytes: 0 %
Lymphocytes Relative: 15 %
Lymphs Abs: 0.6 10*3/uL — ABNORMAL LOW (ref 0.7–4.0)
MCH: 30.3 pg (ref 26.0–34.0)
MCHC: 33.5 g/dL (ref 30.0–36.0)
MCV: 90.5 fL (ref 80.0–100.0)
Monocytes Absolute: 0.4 10*3/uL (ref 0.1–1.0)
Monocytes Relative: 11 %
Neutro Abs: 2.3 10*3/uL (ref 1.7–7.7)
Neutrophils Relative %: 59 %
Platelet Count: 83 10*3/uL — ABNORMAL LOW (ref 150–400)
RBC: 4.32 MIL/uL (ref 3.87–5.11)
RDW: 12.2 % (ref 11.5–15.5)
WBC Count: 3.9 10*3/uL — ABNORMAL LOW (ref 4.0–10.5)
nRBC: 0 % (ref 0.0–0.2)

## 2019-02-10 MED ORDER — HYDROMORPHONE HCL 1 MG/ML IJ SOLN
INTRAMUSCULAR | Status: AC
  Filled 2019-02-10: qty 2

## 2019-02-10 MED ORDER — HEPARIN SOD (PORK) LOCK FLUSH 100 UNIT/ML IV SOLN
500.0000 [IU] | Freq: Once | INTRAVENOUS | Status: AC | PRN
  Administered 2019-02-10: 500 [IU]
  Filled 2019-02-10: qty 5

## 2019-02-10 MED ORDER — HYDROMORPHONE HCL 1 MG/ML IJ SOLN
2.0000 mg | Freq: Once | INTRAMUSCULAR | Status: AC
  Administered 2019-02-10: 2 mg via INTRAVENOUS

## 2019-02-10 MED ORDER — CYCLOBENZAPRINE HCL 10 MG PO TABS: 10.0000 mg | ORAL_TABLET | Freq: Three times a day (TID) | ORAL | 2 refills | Status: DC | PRN

## 2019-02-10 MED ORDER — SODIUM CHLORIDE 0.9 % IV SOLN
Freq: Once | INTRAVENOUS | Status: AC
  Administered 2019-02-10: 11:00:00 via INTRAVENOUS
  Filled 2019-02-10: qty 250

## 2019-02-10 MED ORDER — ONDANSETRON 8 MG PO TBDP: 8.0000 mg | ORAL_TABLET | Freq: Four times a day (QID) | ORAL | 2 refills | Status: AC | PRN

## 2019-02-10 MED ORDER — SODIUM CHLORIDE 0.9 % IV SOLN
15.0000 mg/kg | Freq: Once | INTRAVENOUS | Status: AC
  Administered 2019-02-10: 1100 mg via INTRAVENOUS
  Filled 2019-02-10: qty 32

## 2019-02-10 MED ORDER — SODIUM CHLORIDE 0.9% FLUSH
10.0000 mL | INTRAVENOUS | Status: DC | PRN
  Administered 2019-02-10: 10 mL
  Filled 2019-02-10: qty 10

## 2019-02-10 MED ORDER — SODIUM CHLORIDE 0.9% FLUSH
10.0000 mL | Freq: Once | INTRAVENOUS | Status: AC
  Administered 2019-02-10: 10 mL via INTRAVENOUS
  Filled 2019-02-10: qty 10

## 2019-02-10 MED ORDER — SODIUM CHLORIDE 0.9 % IV SOLN
1200.0000 mg | Freq: Once | INTRAVENOUS | Status: AC
  Administered 2019-02-10: 12:00:00 1200 mg via INTRAVENOUS
  Filled 2019-02-10: qty 20

## 2019-02-10 NOTE — Patient Instructions (Signed)
Tunneled Central Venous Catheter Flushing Guide  It is important to flush your tunneled central venous catheter each time you use it, both before and after you use it. Flushing your catheter will help prevent it from clogging. What are the risks? Risks may include:  Infection.  Air getting into the catheter and bloodstream. Supplies needed:  A clean pair of gloves.  A disinfecting wipe. Use an alcohol wipe, chlorhexidine wipe, or iodine wipe as told by your health care provider.  A 10 mL syringe that has been prefilled with saline solution.  An empty 10 mL syringe, if a substance called heparin was injected into your catheter. How to flush your catheter When you flush your catheter, make sure you follow any specific instructions from your health care provider or the manufacturer. These are general guidelines. Flushing your catheter before use If there is heparin in your catheter: 1. Wash your hands with soap and water. 2. Put on gloves. 3. Scrub the injection cap for a minimum of 15 seconds with a disinfecting wipe. 4. Unclamp the catheter. 5. Attach the empty syringe to the injection cap. 6. Pull the syringe plunger back and withdraw 10 mL of blood. 7. Place the syringe into an appropriate waste container. 8. Scrub the injection cap for 15 seconds with a disinfecting wipe. 9. Attach the prefilled syringe to the injection cap. 10. Flush the catheter by pushing the plunger forward until all the liquid from the syringe is in the catheter. 11. Remove the syringe from the injection cap. 12. Clamp the catheter. If there is no heparin in your catheter: 1. Wash your hands with soap and water. 2. Put on gloves. 3. Scrub the injection cap for 15 seconds with a disinfecting wipe. 4. Unclamp the catheter. 5. Attach the prefilled syringe to the injection cap. 6. Flush the catheter by pushing the plunger forward until 5 mL of the liquid from the syringe is in the catheter. 7. Pull back on  the syringe until you see blood in the catheter. 8. If you have been asked to collect any blood, follow your health care provider's instructions. Otherwise, flush the catheter with the rest of the solution from the syringe. 9. Remove the syringe from the injection cap. 10. Clamp the catheter.  Flushing your catheter after use 1. Wash your hands with soap and water. 2. Put on gloves. 3. Scrub the injection cap for 15 seconds with a disinfecting wipe. 4. Unclamp the catheter. 5. Attach the prefilled syringe to the injection cap. 6. Flush the catheter by pushing the plunger forward until all of the liquid from the syringe is in the catheter. 7. Remove the syringe from the injection cap. 8. Clamp the catheter. Problems and solutions  If blood cannot be completely cleared from the injection cap, you may need to have the injection cap replaced.  If the catheter is difficult to flush, use the pulsing method. The pulsing method involves pushing only a few milliliters of solution into the catheter at a time and pausing between pushes.  If you do not see blood in the catheter when you pull back on the syringe, change your body position, such as by raising your arms above your head. Take a deep breath and cough. Then, pull back on the syringe. If you still do not see blood, flush the catheter with a small amount of solution. Then, change positions again and take a breath or cough. Pull back on the syringe again. If you still do not see   blood, finish flushing the catheter and contact your health care provider. Do not use your catheter until your health care provider says it is okay. General tips  Have someone help you flush your catheter, if possible.  Do not force fluid through your catheter.  Do not use a syringe that is larger or smaller than 10 mL. Using a smaller syringe can make the catheter burst.  Do not use your catheter without flushing it first if it has heparin in it. Contact a health  care provider if:  You cannot see any blood in the catheter when you flush it before using it.  Your catheter is difficult to flush. Get help right away if:  You cannot flush the catheter.  The catheter leaks when you flush it or when there is fluid in it.  There are cracks or breaks in the catheter. Summary  It is important to flush your tunneled central venous catheter each time you use it, both before and after you use it.  Scrub the injection cap for 15 seconds with a disinfecting wipe before and after you flush it.  When you flush your catheter, make sure you follow any specific instructions from your health care provider or the manufacturer.  Get help right away if you cannot flush the catheter. This information is not intended to replace advice given to you by your health care provider. Make sure you discuss any questions you have with your health care provider. Document Released: 05/01/2011 Document Revised: 07/28/2018 Document Reviewed: 07/28/2018 Elsevier Patient Education  2020 Elsevier Inc.  

## 2019-02-10 NOTE — Patient Instructions (Signed)
Atezolizumab injection What is this medicine? ATEZOLIZUMAB (a te zoe LIZ ue mab) is a monoclonal antibody. It is used to treat bladder cancer (urothelial cancer), non-small cell lung cancer, small cell lung cancer, and breast cancer. This medicine may be used for other purposes; ask your health care provider or pharmacist if you have questions. COMMON BRAND NAME(S): Tecentriq What should I tell my health care provider before I take this medicine? They need to know if you have any of these conditions:  diabetes  immune system problems  infection  inflammatory bowel disease  liver disease  lung or breathing disease  lupus  nervous system problems like myasthenia gravis or Guillain-Barre syndrome  organ transplant  an unusual or allergic reaction to atezolizumab, other medicines, foods, dyes, or preservatives  pregnant or trying to get pregnant  breast-feeding How should I use this medicine? This medicine is for infusion into a vein. It is given by a health care professional in a hospital or clinic setting. A special MedGuide will be given to you before each treatment. Be sure to read this information carefully each time. Talk to your pediatrician regarding the use of this medicine in children. Special care may be needed. Overdosage: If you think you have taken too much of this medicine contact a poison control center or emergency room at once. NOTE: This medicine is only for you. Do not share this medicine with others. What if I miss a dose? It is important not to miss your dose. Call your doctor or health care professional if you are unable to keep an appointment. What may interact with this medicine? Interactions have not been studied. This list may not describe all possible interactions. Give your health care provider a list of all the medicines, herbs, non-prescription drugs, or dietary supplements you use. Also tell them if you smoke, drink alcohol, or use illegal drugs.  Some items may interact with your medicine. What should I watch for while using this medicine? Your condition will be monitored carefully while you are receiving this medicine. You may need blood work done while you are taking this medicine. Do not become pregnant while taking this medicine or for at least 5 months after stopping it. Women should inform their doctor if they wish to become pregnant or think they might be pregnant. There is a potential for serious side effects to an unborn child. Talk to your health care professional or pharmacist for more information. Do not breast-feed an infant while taking this medicine or for at least 5 months after the last dose. What side effects may I notice from receiving this medicine? Side effects that you should report to your doctor or health care professional as soon as possible:  allergic reactions like skin rash, itching or hives, swelling of the face, lips, or tongue  black, tarry stools  bloody or watery diarrhea  breathing problems  changes in vision  chest pain or chest tightness  chills  facial flushing  fever  headache  signs and symptoms of high blood sugar such as dizziness; dry mouth; dry skin; fruity breath; nausea; stomach pain; increased hunger or thirst; increased urination  signs and symptoms of liver injury like dark yellow or brown urine; general ill feeling or flu-like symptoms; light-colored stools; loss of appetite; nausea; right upper belly pain; unusually weak or tired; yellowing of the eyes or skin  stomach pain  trouble passing urine or change in the amount of urine Side effects that usually do not require medical   attention (report to your doctor or health care professional if they continue or are bothersome):  cough  diarrhea  joint pain  muscle pain  muscle weakness  tiredness  weight loss This list may not describe all possible side effects. Call your doctor for medical advice about side  effects. You may report side effects to FDA at 1-800-FDA-1088. Where should I keep my medicine? This drug is given in a hospital or clinic and will not be stored at home. NOTE: This sheet is a summary. It may not cover all possible information. If you have questions about this medicine, talk to your doctor, pharmacist, or health care provider.  2020 Elsevier/Gold Standard (2017-08-14 09:33:38) Bevacizumab injection What is this medicine? BEVACIZUMAB (be va SIZ yoo mab) is a monoclonal antibody. It is used to treat many types of cancer. This medicine may be used for other purposes; ask your health care provider or pharmacist if you have questions. COMMON BRAND NAME(S): Avastin, MVASI, Zirabev What should I tell my health care provider before I take this medicine? They need to know if you have any of these conditions:  diabetes  heart disease  high blood pressure  history of coughing up blood  prior anthracycline chemotherapy (e.g., doxorubicin, daunorubicin, epirubicin)  recent or ongoing radiation therapy  recent or planning to have surgery  stroke  an unusual or allergic reaction to bevacizumab, hamster proteins, mouse proteins, other medicines, foods, dyes, or preservatives  pregnant or trying to get pregnant  breast-feeding How should I use this medicine? This medicine is for infusion into a vein. It is given by a health care professional in a hospital or clinic setting. Talk to your pediatrician regarding the use of this medicine in children. Special care may be needed. Overdosage: If you think you have taken too much of this medicine contact a poison control center or emergency room at once. NOTE: This medicine is only for you. Do not share this medicine with others. What if I miss a dose? It is important not to miss your dose. Call your doctor or health care professional if you are unable to keep an appointment. What may interact with this medicine? Interactions are  not expected. This list may not describe all possible interactions. Give your health care provider a list of all the medicines, herbs, non-prescription drugs, or dietary supplements you use. Also tell them if you smoke, drink alcohol, or use illegal drugs. Some items may interact with your medicine. What should I watch for while using this medicine? Your condition will be monitored carefully while you are receiving this medicine. You will need important blood work and urine testing done while you are taking this medicine. This medicine may increase your risk to bruise or bleed. Call your doctor or health care professional if you notice any unusual bleeding. This medicine should be started at least 28 days following major surgery and the site of the surgery should be totally healed. Check with your doctor before scheduling dental work or surgery while you are receiving this treatment. Talk to your doctor if you have recently had surgery or if you have a wound that has not healed. Do not become pregnant while taking this medicine or for 6 months after stopping it. Women should inform their doctor if they wish to become pregnant or think they might be pregnant. There is a potential for serious side effects to an unborn child. Talk to your health care professional or pharmacist for more information. Do not breast-feed   an infant while taking this medicine and for 6 months after the last dose. This medicine has caused ovarian failure in some women. This medicine may interfere with the ability to have a child. You should talk to your doctor or health care professional if you are concerned about your fertility. What side effects may I notice from receiving this medicine? Side effects that you should report to your doctor or health care professional as soon as possible:  allergic reactions like skin rash, itching or hives, swelling of the face, lips, or tongue  chest pain or chest tightness  chills  coughing  up blood  high fever  seizures  severe constipation  signs and symptoms of bleeding such as bloody or black, tarry stools; red or dark-brown urine; spitting up blood or brown material that looks like coffee grounds; red spots on the skin; unusual bruising or bleeding from the eye, gums, or nose  signs and symptoms of a blood clot such as breathing problems; chest pain; severe, sudden headache; pain, swelling, warmth in the leg  signs and symptoms of a stroke like changes in vision; confusion; trouble speaking or understanding; severe headaches; sudden numbness or weakness of the face, arm or leg; trouble walking; dizziness; loss of balance or coordination  stomach pain  sweating  swelling of legs or ankles  vomiting  weight gain Side effects that usually do not require medical attention (report to your doctor or health care professional if they continue or are bothersome):  back pain  changes in taste  decreased appetite  dry skin  nausea  tiredness This list may not describe all possible side effects. Call your doctor for medical advice about side effects. You may report side effects to FDA at 1-800-FDA-1088. Where should I keep my medicine? This drug is given in a hospital or clinic and will not be stored at home. NOTE: This sheet is a summary. It may not cover all possible information. If you have questions about this medicine, talk to your doctor, pharmacist, or health care provider.  2020 Elsevier/Gold Standard (2016-05-09 14:33:29)  

## 2019-02-10 NOTE — Progress Notes (Signed)
PLTC = 83, okay to treat today per Dr. Marin Olp.

## 2019-02-10 NOTE — Progress Notes (Signed)
Hematology and Oncology Follow Up Visit  Oni Dietzman [id number] 1990-10-04 28 y.o. 02/10/2019   Principle Diagnosis:  Metastatic breast cancer - BRCA (+) - ER-/PR-/HER2-  Vertebral metastases on 05/17/2018 -- TMB -low; MSI - stable; PIK3CA - wt Iron deficiency anemia   Past Therapy: Taxotere/Carboplatin/Herceptin/Perjeta -s/p cycle 4 S/P Bilateral mastectomies - 10/09/2017 Ovarian ablation with Lupron - stopped 11/06/2017 S/p TAH-BSO on 01/19/2018 Radiation Therapy -- back and ribs - started on 07/21/2018 Radiation Therapy --  Left pelvis - started on 01/17/2019 and completed 02/02/2019  Current Therapy:   Tecentriq/Avastin -- q 3 wk cycles --s/p cycle 8 - started 06/23/2018 Lonie Peak (Olapirib) 300 mg po BID -- start 06/23/2018 IV Iron as indicated Zometa 4 mg IV q 3 month -- next dose10/2020   Interim History:  Ms. Marzano is here today for follow-up and treatment. She is doing fairly well but sneezed and pulled a muscle in her mid upper back several days ago. Her PCP gave her Methocarbamol 500 mg PO PRN for pain and she was unable to swallow the while pill. She brought them in today and they are huge.  I spoke with Dr. Marin Olp and we switch her over to [facility].  She states that her symptoms are much improved since completing pelvic  CA 27.29 last month was 15.6.  She states that she restarted the [place] as prescribed last week.  She states that she will randomly have nausea and has vomited a few times. She states that she has not noted what causes this. She is out of Zofran ODT. No fever, chills, cough, rash, dizziness, SOB, chest pain, palpitations, abdominal pain or changes in bowel or bladder habits.  No swelling, numbness or tingling in her extremities at this time.  No falls or syncopal episodes.  He has maintained a good appetite and is staying well hydrated. His weight is stable.   ECOG Performance Status: 1 - Symptomatic but completely ambulatory   Medications:  Allergies as of 02/10/2019   No Known Allergies     Medication List       Accurate as of February 10, 2019 10:16 AM. If you have any questions, ask your nurse or doctor.        methocarbamol 500 MG tablet Commonly known as: ROBAXIN TK 1 T PO BID PRF MSP   olaparib 150 MG tablet Commonly known as: LYNPARZA Take 2 tablets (300 mg total) by mouth 2 (two) times daily. Swallow whole.   Tecentriq 1200 MG/20ML Generic drug: atezolizumab Inject 20 mLs (1,200 mg total) into the vein every 21 ( twenty-one) days. Admix with 250 cc Normal Saline.  May administer with or without  .2 micron in line filter.  Not compatible with D5W       Allergies: No Known Allergies  Past Medical History, Surgical history, Social history, and Family History were reviewed and updated.  Review of Systems: All other 10 point review of systems is negative.   Physical Exam:  height is '5\' 9"'$  (1.753 m) and weight is 161 lb (73 kg). Her blood pressure is 121/93 (abnormal) and her pulse is 103 (abnormal). Her respiration is 17 and oxygen saturation is 99%.   Wt Readings from Last 3 Encounters:  02/10/19 161 lb (73 kg)  01/25/19 161 lb (73 kg)  01/20/19 163 lb (73.9 kg)    Ocular: Sclerae unicteric, pupils equal, round and reactive to light Ear-nose-throat: Oropharynx clear, dentition fair Lymphatic: No axillary or supraclavicular adenopathy noted on exam, has minimal fullness in  the left clavicular region that appears to be improved.  Lungs no rales or rhonchi, good excursion bilaterally Heart regular rate and rhythm, no murmur appreciated Abd soft, nontender, positive bowel sounds, no liver or spleen tip palpated on exam, no fluid wave  MSK no focal spinal tenderness, no joint edema Neuro: non-focal, well-oriented, appropriate affect Breasts: Bilateral mastectomy with reconstruction   Lab Results  Component Value Date   WBC 3.9 (L) 02/10/2019   HGB 13.1 02/10/2019   HCT 39.1  02/10/2019   MCV 90.5 02/10/2019   PLT 83 (L) 02/10/2019   Lab Results  Component Value Date   FERRITIN 332 (H) 01/20/2019   IRON 59 01/20/2019   TIBC 235 (L) 01/20/2019   UIBC 176 01/20/2019   IRONPCTSAT 25 01/20/2019   Lab Results  Component Value Date   RBC 4.32 02/10/2019   No results found for: KPAFRELGTCHN, LAMBDASER, KAPLAMBRATIO No results found for: IGGSERUM, IGA, IGMSERUM No results found for: Odetta Pink, SPEI   Chemistry      Component Value Date/Time   NA 136 01/20/2019 1030   K 4.1 01/20/2019 1030   CL 102 01/20/2019 1030   CO2 26 01/20/2019 1030   BUN 16 01/20/2019 1030   CREATININE 0.65 01/20/2019 1030      Component Value Date/Time   CALCIUM 9.7 01/20/2019 1030   ALKPHOS 170 (H) 01/20/2019 1030   AST 45 (H) 01/20/2019 1030   ALT 36 01/20/2019 1030   BILITOT 0.6 01/20/2019 1030       Impression and Plan: Ms. Pankonin is a very pleasant 28 yo caucasian female with metastatic breast cancer HER-2- and ER negative. She is BRCA positive.  We will refill her Zofran ODT for any nausea. We will also have her stop the Methocarbamol and switch to Flexeril for muscle spasm in the back.  We will proceed with treatment today as planned.  She is due for her PET scan so we will get this set up for in 2 weeks.  We will see her back in another 3 weeks.  She will contact our office with any questions or concerns. We can certainly see her sooner if needed.   Laverna Peace, NP 9/17/202010:16 AM

## 2019-02-11 LAB — CANCER ANTIGEN 27.29: CA 27.29: 18.8 U/mL (ref 0.0–38.6)

## 2019-02-14 ENCOUNTER — Telehealth: Payer: Self-pay | Admitting: Hematology & Oncology

## 2019-02-14 NOTE — Telephone Encounter (Signed)
Called and LMVM for patient with updated appointment schedule.  Appts moved out from 10/8 to 10/15 due to patient vacation per 9/17 los

## 2019-02-21 ENCOUNTER — Telehealth: Payer: Self-pay | Admitting: Hematology & Oncology

## 2019-02-21 ENCOUNTER — Encounter: Payer: Self-pay | Admitting: Hematology & Oncology

## 2019-02-21 NOTE — Telephone Encounter (Signed)
Called and LMVM for patient regarding appointment for PET Scan w/ instructions

## 2019-03-01 ENCOUNTER — Telehealth: Payer: Self-pay | Admitting: Hematology & Oncology

## 2019-03-01 ENCOUNTER — Other Ambulatory Visit: Payer: Self-pay | Admitting: *Deleted

## 2019-03-01 ENCOUNTER — Encounter: Payer: Self-pay | Admitting: Hematology & Oncology

## 2019-03-01 ENCOUNTER — Other Ambulatory Visit: Payer: Self-pay | Admitting: Hematology & Oncology

## 2019-03-01 ENCOUNTER — Telehealth: Payer: Self-pay | Admitting: *Deleted

## 2019-03-01 MED ORDER — TRAMADOL HCL 50 MG PO TABS: 50.0000 mg | ORAL_TABLET | Freq: Four times a day (QID) | ORAL | 0 refills | Status: AC | PRN

## 2019-03-01 MED ORDER — TRAMADOL HCL 50 MG PO TABS: 50.0000 mg | ORAL_TABLET | Freq: Four times a day (QID) | ORAL | 0 refills | Status: DC | PRN

## 2019-03-01 NOTE — Telephone Encounter (Signed)
Call received from patient to go over her concerns from most recent MyChart message.  Pt notified per order of Dr. Marin Olp to stop ibuprofen, to take OTC Pepcid or Pepto Bismol and that prescription for Tramadol has been sent in for pt.  Pt states that she is currently in [facility] and requests that prescription be sent to [facility] in [place].  Pt states that she will stop taking Ibuprofen and is appreciative of assistance.  Message sent to scheduling to reschedule pt.'s PET scan.

## 2019-03-02 ENCOUNTER — Ambulatory Visit (HOSPITAL_COMMUNITY): Payer: Medicaid Other

## 2019-03-03 ENCOUNTER — Ambulatory Visit: Payer: PRIVATE HEALTH INSURANCE

## 2019-03-03 ENCOUNTER — Other Ambulatory Visit: Payer: PRIVATE HEALTH INSURANCE

## 2019-03-03 ENCOUNTER — Ambulatory Visit: Payer: PRIVATE HEALTH INSURANCE | Admitting: Hematology & Oncology

## 2019-03-07 ENCOUNTER — Encounter (HOSPITAL_COMMUNITY): Admission: RE | Admit: 2019-03-07 | Payer: PRIVATE HEALTH INSURANCE | Source: Ambulatory Visit

## 2019-03-07 NOTE — Progress Notes (Signed)
  Patient Name: Sophia [name] MRN: [id number] DOB: 17-Dec-1990 Referring Physician: Burney Gauze (Profile Not Attached) Date of Service: 02/02/2019 [facility], [place]                                                        End Of Treatment Note  Diagnoses: C79.51-Secondary malignant neoplasm of bone  Cancer Staging: Metastatic Breast Cancer, BRCA+  Intent: Palliative  Radiation Treatment Dates: 01/18/2019 through 02/02/2019 Site Technique Total Dose Dose per Fx Completed Fx Beam Energies  Pelvis: Pelvis_Lt Complex 30/30 3 10/10 10X, 15X   Narrative: The patient tolerated radiation therapy relatively well. She reported pain improvement, as well as being able to bear more weight.  Plan: The patient will follow-up with radiation oncology as needed.  ________________________________________________   Blair Promise, PhD, MD  This document serves as a record of services personally performed by Gery Pray, MD. It was created on his behalf by Wilburn Mylar, a trained medical scribe. The creation of this record is based on the scribe's personal observations and the provider's statements to them. This document has been checked and approved by the attending provider.

## 2019-03-10 ENCOUNTER — Inpatient Hospital Stay (HOSPITAL_BASED_OUTPATIENT_CLINIC_OR_DEPARTMENT_OTHER): Payer: Medicaid Other | Admitting: Hematology & Oncology

## 2019-03-10 ENCOUNTER — Other Ambulatory Visit: Payer: Self-pay

## 2019-03-10 ENCOUNTER — Inpatient Hospital Stay: Payer: Medicaid Other | Attending: Hematology & Oncology

## 2019-03-10 ENCOUNTER — Inpatient Hospital Stay: Payer: Medicaid Other

## 2019-03-10 ENCOUNTER — Encounter: Payer: Self-pay | Admitting: Hematology & Oncology

## 2019-03-10 VITALS — BP 107/72 | HR 102 | Temp 97.1°F | Resp 18 | Wt 151.0 lb

## 2019-03-10 VITALS — HR 68

## 2019-03-10 DIAGNOSIS — R809 Proteinuria, unspecified: Secondary | ICD-10-CM

## 2019-03-10 DIAGNOSIS — Z5112 Encounter for antineoplastic immunotherapy: Secondary | ICD-10-CM | POA: Diagnosis present

## 2019-03-10 DIAGNOSIS — M546 Pain in thoracic spine: Secondary | ICD-10-CM

## 2019-03-10 DIAGNOSIS — G8929 Other chronic pain: Secondary | ICD-10-CM

## 2019-03-10 DIAGNOSIS — C50919 Malignant neoplasm of unspecified site of unspecified female breast: Secondary | ICD-10-CM | POA: Diagnosis present

## 2019-03-10 DIAGNOSIS — Z171 Estrogen receptor negative status [ER-]: Secondary | ICD-10-CM

## 2019-03-10 DIAGNOSIS — C50111 Malignant neoplasm of central portion of right female breast: Secondary | ICD-10-CM

## 2019-03-10 DIAGNOSIS — C7951 Secondary malignant neoplasm of bone: Secondary | ICD-10-CM | POA: Diagnosis not present

## 2019-03-10 DIAGNOSIS — R52 Pain, unspecified: Secondary | ICD-10-CM | POA: Diagnosis not present

## 2019-03-10 DIAGNOSIS — C787 Secondary malignant neoplasm of liver and intrahepatic bile duct: Secondary | ICD-10-CM | POA: Diagnosis not present

## 2019-03-10 DIAGNOSIS — F418 Other specified anxiety disorders: Secondary | ICD-10-CM

## 2019-03-10 DIAGNOSIS — Z95828 Presence of other vascular implants and grafts: Secondary | ICD-10-CM

## 2019-03-10 DIAGNOSIS — R4589 Other symptoms and signs involving emotional state: Secondary | ICD-10-CM

## 2019-03-10 LAB — CMP (CANCER CENTER ONLY)
ALT: 32 U/L (ref 0–44)
AST: 58 U/L — ABNORMAL HIGH (ref 15–41)
Albumin: 4.3 g/dL (ref 3.5–5.0)
Alkaline Phosphatase: 179 U/L — ABNORMAL HIGH (ref 38–126)
Anion gap: 13 (ref 5–15)
BUN: 15 mg/dL (ref 6–20)
CO2: 24 mmol/L (ref 22–32)
Calcium: 10.2 mg/dL (ref 8.9–10.3)
Chloride: 96 mmol/L — ABNORMAL LOW (ref 98–111)
Creatinine: 0.62 mg/dL (ref 0.44–1.00)
GFR, Est AFR Am: >60 mL/min (ref >60)
GFR, Estimated: >60 mL/min (ref >60)
Glucose, Bld: 100 mg/dL — ABNORMAL HIGH (ref 70–99)
Potassium: 3.8 mmol/L (ref 3.5–5.1)
Sodium: 133 mmol/L — ABNORMAL LOW (ref 135–145)
Total Bilirubin: 0.7 mg/dL (ref 0.3–1.2)
Total Protein: 8 g/dL (ref 6.5–8.1)

## 2019-03-10 LAB — CBC WITH DIFFERENTIAL (CANCER CENTER ONLY)
Abs Immature Granulocytes: 0.01 10*3/uL (ref 0.00–0.07)
Basophils Absolute: 0 10*3/uL (ref 0.0–0.1)
Basophils Relative: 1 %
Eosinophils Absolute: 0.6 10*3/uL — ABNORMAL HIGH (ref 0.0–0.5)
Eosinophils Relative: 11 %
HCT: 39.1 % (ref 36.0–46.0)
Hemoglobin: 13.1 g/dL (ref 12.0–15.0)
Immature Granulocytes: 0 %
Lymphocytes Relative: 11 %
Lymphs Abs: 0.6 10*3/uL — ABNORMAL LOW (ref 0.7–4.0)
MCH: 29.4 pg (ref 26.0–34.0)
MCHC: 33.5 g/dL (ref 30.0–36.0)
MCV: 87.7 fL (ref 80.0–100.0)
Monocytes Absolute: 0.5 10*3/uL (ref 0.1–1.0)
Monocytes Relative: 10 %
Neutro Abs: 3.6 10*3/uL (ref 1.7–7.7)
Neutrophils Relative %: 67 %
Platelet Count: 91 10*3/uL — ABNORMAL LOW (ref 150–400)
RBC: 4.46 MIL/uL (ref 3.87–5.11)
RDW: 13.4 % (ref 11.5–15.5)
WBC Count: 5.4 10*3/uL (ref 4.0–10.5)
nRBC: 0 % (ref 0.0–0.2)

## 2019-03-10 LAB — TOTAL PROTEIN, URINE DIPSTICK: Protein, ur: 100 mg/dL — AB

## 2019-03-10 MED ORDER — DIAZEPAM 5 MG/ML IJ SOLN
INTRAMUSCULAR | Status: AC
  Filled 2019-03-10: qty 2

## 2019-03-10 MED ORDER — SODIUM CHLORIDE 0.9 % IV SOLN
15.0000 mg/kg | Freq: Once | INTRAVENOUS | Status: AC
  Administered 2019-03-10: 14:00:00 1100 mg via INTRAVENOUS
  Filled 2019-03-10: qty 32

## 2019-03-10 MED ORDER — ONDANSETRON HCL 8 MG PO TABS
ORAL_TABLET | ORAL | Status: AC
  Filled 2019-03-10: qty 1

## 2019-03-10 MED ORDER — SODIUM CHLORIDE 0.9 % IV SOLN
Freq: Once | INTRAVENOUS | Status: AC
  Filled 2019-03-10: qty 250

## 2019-03-10 MED ORDER — SODIUM CHLORIDE 0.9 % IV SOLN
1200.0000 mg | Freq: Once | INTRAVENOUS | Status: AC
  Administered 2019-03-10: 14:00:00 1200 mg via INTRAVENOUS
  Filled 2019-03-10: qty 20

## 2019-03-10 MED ORDER — KETOROLAC TROMETHAMINE 15 MG/ML IJ SOLN
15.0000 mg | Freq: Once | INTRAMUSCULAR | Status: AC
  Administered 2019-03-10: 15 mg via INTRAVENOUS

## 2019-03-10 MED ORDER — HEPARIN SOD (PORK) LOCK FLUSH 100 UNIT/ML IV SOLN
500.0000 [IU] | Freq: Once | INTRAVENOUS | Status: AC | PRN
  Administered 2019-03-10: 500 [IU]
  Filled 2019-03-10: qty 5

## 2019-03-10 MED ORDER — SODIUM CHLORIDE 0.9% FLUSH
10.0000 mL | Freq: Once | INTRAVENOUS | Status: AC
  Administered 2019-03-10: 11:00:00 10 mL via INTRAVENOUS
  Filled 2019-03-10: qty 10

## 2019-03-10 MED ORDER — SODIUM CHLORIDE 0.9% FLUSH
10.0000 mL | INTRAVENOUS | Status: DC | PRN
  Administered 2019-03-10: 10 mL
  Filled 2019-03-10: qty 10

## 2019-03-10 MED ORDER — SODIUM CHLORIDE 0.9 % IV SOLN
INTRAVENOUS | Status: AC
  Administered 2019-03-10: 13:00:00 via INTRAVENOUS
  Filled 2019-03-10 (×2): qty 250

## 2019-03-10 MED ORDER — KETOROLAC TROMETHAMINE 15 MG/ML IJ SOLN
INTRAMUSCULAR | Status: AC
  Filled 2019-03-10: qty 1

## 2019-03-10 MED ORDER — DIAZEPAM 5 MG/ML IJ SOLN
5.0000 mg | Freq: Once | INTRAMUSCULAR | Status: AC
  Administered 2019-03-10: 5 mg via INTRAVENOUS

## 2019-03-10 MED ORDER — METOCLOPRAMIDE HCL 5 MG/ML IJ SOLN
20.0000 mg | Freq: Once | INTRAVENOUS | Status: AC
  Administered 2019-03-10: 20 mg via INTRAVENOUS
  Filled 2019-03-10: qty 4

## 2019-03-10 NOTE — Progress Notes (Signed)
Okay to treat with Avastin today prior to UP resulting per Dr. Marin Olp.

## 2019-03-10 NOTE — Progress Notes (Signed)
Hematology and Oncology Follow Up Visit  Sophia [name] [id number] 1991-03-18 28 y.o. 03/10/2019   Principle Diagnosis:  Metastatic breast cancer - BRCA (+) - ER-/PR-/HER2-  Vertebral metastases on 05/17/2018 -- TMB -low; MSI - stable; PIK3CA - wt Iron deficiency anemia   Past Therapy: Taxotere/Carboplatin/Herceptin/Perjeta -s/p cycle 4 S/P Bilateral mastectomies - 10/09/2017 Ovarian ablation with Lupron - stopped 11/06/2017 S/p TAH-BSO on 01/19/2018 Radiation Therapy -- back and ribs - started on 07/21/2018 Radiation Therapy --  Left pelvis - started on 01/17/2019 and completed 02/02/2019  Current Therapy:   Tecentriq/Avastin -- q 3 wk cycles --s/p cycle 8 - started 06/23/2018 Lonie Peak (Olapirib) 300 mg po BID -- start 06/23/2018 IV Iron as indicated Zometa 4 mg IV q 3 month -- next dose10/2020   Interim History:  Sophia [name] is here today for follow-up and treatment.  She really does not feel all that well today.  She has a lot of anxiety.  She has had a lot of vomiting she says.  Not sure as to why she has a vomiting.  She had radiation therapy down to the pelvic area.  I would think this would have causing issues with vomiting.  She says that there is no nausea.  She says she just eats and gets sick.  She is also having some more back discomfort.  This is in the middle of the back.  I doubt that she is taking her [place]  I know this has been an issue with her for several months.  I am still not sure as to how much she has taken.  This I think is the real critical component of her protocol given that she is BRCA positive.  We will have to get a PET scan on her now.  She was supposed to be set up with a PET scan.  She hopefully will have a response to the Tecentriq and Avastin.  She has lost some weight.  Again, she says she has had this vomiting.  I listen to her abdomen, it was quite silent.  It was not distended.  I will try her on some Reglan to see if this might  help a little bit.  Last CA 27.29 was stable at 19.  ECOG Performance Status: 1 - Symptomatic but completely ambulatory  Medications:  Allergies as of 03/10/2019   No Known Allergies     Medication List       Accurate as of March 10, 2019 12:10 PM. If you have any questions, ask your nurse or doctor.        STOP taking these medications   cyclobenzaprine 10 MG tablet Commonly known as: FLEXERIL Stopped by: Volanda Napoleon, MD     TAKE these medications   olaparib 150 MG tablet Commonly known as: LYNPARZA Take 2 tablets (300 mg total) by mouth 2 (two) times daily. Swallow whole.   ondansetron 8 MG disintegrating tablet Commonly known as: ZOFRAN-ODT Take 1 tablet (8 mg total) by mouth every 6 (six) hours as needed for nausea or vomiting.   Tecentriq 1200 MG/20ML Generic drug: atezolizumab Inject 20 mLs (1,200 mg total) into the vein every 21 ( twenty-one) days. Admix with 250 cc Normal Saline.  May administer with or without  .2 micron in line filter.  Not compatible with D5W   traMADol 50 MG tablet Commonly known as: ULTRAM Take 1 tablet (50 mg total) by mouth every 6 (six) hours as needed.       Allergies: No Known  Allergies  Past Medical History, Surgical history, Social history, and Family History were reviewed and updated.  Review of Systems: Review of Systems  Constitutional: Negative.   HENT: Negative.   Eyes: Negative.   Respiratory: Negative.   Cardiovascular: Negative.   Gastrointestinal: Negative.   Genitourinary: Negative.   Musculoskeletal: Negative.   Skin: Negative.   Neurological: Negative.   Endo/Heme/Allergies: Negative.   Psychiatric/Behavioral: Negative.      Physical Exam:  weight is 151 lb (68.5 kg). Her temporal temperature is 97.1 F (36.2 C) (abnormal). Her blood pressure is 107/72 and her pulse is 102 (abnormal). Her respiration is 18 and oxygen saturation is 100%.   Wt Readings from Last 3 Encounters:  03/10/19 151 lb  (68.5 kg)  02/10/19 161 lb (73 kg)  01/25/19 161 lb (73 kg)    Physical Exam Vitals signs reviewed.  HENT:     Head: Normocephalic and atraumatic.  Eyes:     Pupils: Pupils are equal, round, and reactive to light.  Neck:     Musculoskeletal: Normal range of motion.  Cardiovascular:     Rate and Rhythm: Normal rate and regular rhythm.     Heart sounds: Normal heart sounds.  Pulmonary:     Effort: Pulmonary effort is normal.     Breath sounds: Normal breath sounds.  Abdominal:     General: Bowel sounds are normal.     Palpations: Abdomen is soft.  Musculoskeletal: Normal range of motion.        General: No tenderness or deformity.  Lymphadenopathy:     Cervical: No cervical adenopathy.  Skin:    General: Skin is warm and dry.     Findings: No erythema or rash.  Neurological:     Mental Status: She is alert and oriented to person, place, and time.  Psychiatric:        Behavior: Behavior normal.        Thought Content: Thought content normal.        Judgment: Judgment normal.      Lab Results  Component Value Date   WBC 5.4 03/10/2019   HGB 13.1 03/10/2019   HCT 39.1 03/10/2019   MCV 87.7 03/10/2019   PLT 91 (L) 03/10/2019   Lab Results  Component Value Date   FERRITIN 332 (H) 01/20/2019   IRON 59 01/20/2019   TIBC 235 (L) 01/20/2019   UIBC 176 01/20/2019   IRONPCTSAT 25 01/20/2019   Lab Results  Component Value Date   RBC 4.46 03/10/2019   No results found for: KPAFRELGTCHN, LAMBDASER, KAPLAMBRATIO No results found for: IGGSERUM, IGA, IGMSERUM No results found for: TOTALPROTELP, ALBUMINELP, A1GS, A2GS, Arnaldo Natal, GAMS, MSPIKE, SPEI   Chemistry      Component Value Date/Time   NA 133 (L) 03/10/2019 1120   K 3.8 03/10/2019 1120   CL 96 (L) 03/10/2019 1120   CO2 24 03/10/2019 1120   BUN 15 03/10/2019 1120   CREATININE 0.62 03/10/2019 1120      Component Value Date/Time   CALCIUM 10.2 03/10/2019 1120   ALKPHOS 179 (H) 03/10/2019 1120   AST 58  (H) 03/10/2019 1120   ALT 32 03/10/2019 1120   BILITOT 0.7 03/10/2019 1120       Impression and Plan: Sophia [name] is a very pleasant 28 yo caucasian female with metastatic breast cancer HER-2- and ER negative. She is BRCA positive.   Hopefully, we will not find that she has progressive disease.  Again, I am still not sure  as a whether or not she has been taking the [place]  I have tried to talk to her about this.  I try to tell her how important the Lonie Peak is because it is specific for breast cancer that is BRCA positive.  We will give her some IV pain medication.  We will give her a little bit of IV Valium to see if this may help with the anxiety and also some of the spasms.  We will see about setting the PET scan up for a couple weeks.  Her quality of life is critical.  She has 2 young sons.  We just want her to be able to help them.  We spent about 45 minutes with her today.  This is quite complicated given her young age and her symptoms.  There are a lot of other intervening factors that are also at play.      We will refill her Zofran ODT for any nausea. We will also have her stop the Methocarbamol and switch to Flexeril for muscle spasm in the back.  We will proceed with treatment today as planned.  She is due for her PET scan so we will get this set up for in 2 weeks.  We will see her back in another 3 weeks.  She will contact our office with any questions or concerns. We can certainly see her sooner if needed.   Volanda Napoleon, MD 10/15/202012:10 PM

## 2019-03-10 NOTE — Patient Instructions (Signed)
Tunneled Central Venous Catheter Flushing Guide  It is important to flush your tunneled central venous catheter each time you use it, both before and after you use it. Flushing your catheter will help prevent it from clogging. What are the risks? Risks may include:  Infection.  Air getting into the catheter and bloodstream. Supplies needed:  A clean pair of gloves.  A disinfecting wipe. Use an alcohol wipe, chlorhexidine wipe, or iodine wipe as told by your health care provider.  A 10 mL syringe that has been prefilled with saline solution.  An empty 10 mL syringe, if a substance called heparin was injected into your catheter. How to flush your catheter When you flush your catheter, make sure you follow any specific instructions from your health care provider or the manufacturer. These are general guidelines. Flushing your catheter before use If there is heparin in your catheter: 1. Wash your hands with soap and water. 2. Put on gloves. 3. Scrub the injection cap for a minimum of 15 seconds with a disinfecting wipe. 4. Unclamp the catheter. 5. Attach the empty syringe to the injection cap. 6. Pull the syringe plunger back and withdraw 10 mL of blood. 7. Place the syringe into an appropriate waste container. 8. Scrub the injection cap for 15 seconds with a disinfecting wipe. 9. Attach the prefilled syringe to the injection cap. 10. Flush the catheter by pushing the plunger forward until all the liquid from the syringe is in the catheter. 11. Remove the syringe from the injection cap. 12. Clamp the catheter. If there is no heparin in your catheter: 1. Wash your hands with soap and water. 2. Put on gloves. 3. Scrub the injection cap for 15 seconds with a disinfecting wipe. 4. Unclamp the catheter. 5. Attach the prefilled syringe to the injection cap. 6. Flush the catheter by pushing the plunger forward until 5 mL of the liquid from the syringe is in the catheter. 7. Pull back on  the syringe until you see blood in the catheter. 8. If you have been asked to collect any blood, follow your health care provider's instructions. Otherwise, flush the catheter with the rest of the solution from the syringe. 9. Remove the syringe from the injection cap. 10. Clamp the catheter.  Flushing your catheter after use 1. Wash your hands with soap and water. 2. Put on gloves. 3. Scrub the injection cap for 15 seconds with a disinfecting wipe. 4. Unclamp the catheter. 5. Attach the prefilled syringe to the injection cap. 6. Flush the catheter by pushing the plunger forward until all of the liquid from the syringe is in the catheter. 7. Remove the syringe from the injection cap. 8. Clamp the catheter. Problems and solutions  If blood cannot be completely cleared from the injection cap, you may need to have the injection cap replaced.  If the catheter is difficult to flush, use the pulsing method. The pulsing method involves pushing only a few milliliters of solution into the catheter at a time and pausing between pushes.  If you do not see blood in the catheter when you pull back on the syringe, change your body position, such as by raising your arms above your head. Take a deep breath and cough. Then, pull back on the syringe. If you still do not see blood, flush the catheter with a small amount of solution. Then, change positions again and take a breath or cough. Pull back on the syringe again. If you still do not see   blood, finish flushing the catheter and contact your health care provider. Do not use your catheter until your health care provider says it is okay. General tips  Have someone help you flush your catheter, if possible.  Do not force fluid through your catheter.  Do not use a syringe that is larger or smaller than 10 mL. Using a smaller syringe can make the catheter burst.  Do not use your catheter without flushing it first if it has heparin in it. Contact a health  care provider if:  You cannot see any blood in the catheter when you flush it before using it.  Your catheter is difficult to flush. Get help right away if:  You cannot flush the catheter.  The catheter leaks when you flush it or when there is fluid in it.  There are cracks or breaks in the catheter. Summary  It is important to flush your tunneled central venous catheter each time you use it, both before and after you use it.  Scrub the injection cap for 15 seconds with a disinfecting wipe before and after you flush it.  When you flush your catheter, make sure you follow any specific instructions from your health care provider or the manufacturer.  Get help right away if you cannot flush the catheter. This information is not intended to replace advice given to you by your health care provider. Make sure you discuss any questions you have with your health care provider. Document Released: 05/01/2011 Document Revised: 07/28/2018 Document Reviewed: 07/28/2018 Elsevier Patient Education  2020 Elsevier Inc.  

## 2019-03-11 ENCOUNTER — Telehealth: Payer: Self-pay | Admitting: *Deleted

## 2019-03-11 ENCOUNTER — Encounter: Payer: Self-pay | Admitting: Hematology & Oncology

## 2019-03-11 LAB — CANCER ANTIGEN 27.29: CA 27.29: 18.8 U/mL (ref 0.0–38.6)

## 2019-03-11 MED ORDER — METOCLOPRAMIDE HCL 10 MG PO TABS: 20.0000 mg | ORAL_TABLET | Freq: Three times a day (TID) | ORAL | 4 refills | Status: AC

## 2019-03-11 MED ORDER — FENTANYL 25 MCG/HR TD PT72: 1.0000 | MEDICATED_PATCH | TRANSDERMAL | 0 refills | Status: DC

## 2019-03-11 NOTE — Telephone Encounter (Signed)
Call received from patient to review questions from MyChart message sent this morning.  Pt notified that Reglan and Fentanyl patch have been sent to [facility] in [place] this morning by Dr. Marin Olp and that the PET scan has been ordered, but may take a few days to get scheduled d/t prior authorization.  Pt appreciative of information and has no further questions or concerns at this time.

## 2019-03-14 ENCOUNTER — Telehealth: Payer: Self-pay | Admitting: Hematology & Oncology

## 2019-03-14 MED FILL — LYNPARZA 150 MG TABLET: 150 | 30 days supply | Qty: 120 | Fill #1

## 2019-03-14 NOTE — Telephone Encounter (Signed)
I called and LMVM for patient with date/time/location & instructions for PET Scan that has been scheduled

## 2019-03-18 ENCOUNTER — Encounter: Payer: Self-pay | Admitting: Hematology & Oncology

## 2019-03-18 ENCOUNTER — Other Ambulatory Visit: Payer: Self-pay | Admitting: Family

## 2019-03-18 DIAGNOSIS — G8929 Other chronic pain: Secondary | ICD-10-CM

## 2019-03-18 DIAGNOSIS — C50919 Malignant neoplasm of unspecified site of unspecified female breast: Secondary | ICD-10-CM

## 2019-03-18 MED ORDER — FENTANYL 12 MCG/HR TD PT72: 1.0000 | MEDICATED_PATCH | TRANSDERMAL | 0 refills | Status: DC

## 2019-03-18 NOTE — Progress Notes (Signed)
Patient called and states that Sophia [name] patch 25 mcg was helping with pain but making her too drowsy and afraid to drive. We will reduce her to 12 mcg patch and see if this decreases her issue with drowsiness but helps with her pain.

## 2019-03-21 ENCOUNTER — Other Ambulatory Visit: Payer: Self-pay

## 2019-03-21 ENCOUNTER — Ambulatory Visit (HOSPITAL_COMMUNITY)
Admission: RE | Admit: 2019-03-21 | Discharge: 2019-03-21 | Disposition: A | Discharge status: Home / Self Care | Payer: Medicaid Other | Source: Ambulatory Visit | Attending: Hematology & Oncology | Admitting: Hematology & Oncology

## 2019-03-21 DIAGNOSIS — C7951 Secondary malignant neoplasm of bone: Secondary | ICD-10-CM | POA: Insufficient documentation

## 2019-03-21 DIAGNOSIS — C50919 Malignant neoplasm of unspecified site of unspecified female breast: Secondary | ICD-10-CM | POA: Diagnosis not present

## 2019-03-21 LAB — GLUCOSE, CAPILLARY: Glucose-Capillary: 75 mg/dL (ref 70–99)

## 2019-03-21 MED ORDER — FLUDEOXYGLUCOSE F - 18 (FDG) INJECTION
7.2600 | Freq: Once | INTRAVENOUS | Status: AC | PRN
  Administered 2019-03-21: 10:00:00 7.26 via INTRAVENOUS

## 2019-03-22 ENCOUNTER — Encounter: Payer: Self-pay | Admitting: Hematology & Oncology

## 2019-03-23 ENCOUNTER — Other Ambulatory Visit: Payer: Self-pay | Admitting: Hematology & Oncology

## 2019-03-23 DIAGNOSIS — C50919 Malignant neoplasm of unspecified site of unspecified female breast: Secondary | ICD-10-CM

## 2019-03-23 DIAGNOSIS — C7951 Secondary malignant neoplasm of bone: Secondary | ICD-10-CM

## 2019-03-24 ENCOUNTER — Encounter (HOSPITAL_COMMUNITY): Payer: Self-pay | Admitting: Radiology

## 2019-03-24 ENCOUNTER — Other Ambulatory Visit: Payer: Self-pay

## 2019-03-24 ENCOUNTER — Inpatient Hospital Stay (HOSPITAL_BASED_OUTPATIENT_CLINIC_OR_DEPARTMENT_OTHER): Payer: Medicaid Other | Admitting: Hematology & Oncology

## 2019-03-24 ENCOUNTER — Inpatient Hospital Stay: Payer: Medicaid Other

## 2019-03-24 ENCOUNTER — Other Ambulatory Visit: Payer: Self-pay | Admitting: Family

## 2019-03-24 VITALS — BP 108/77 | HR 102 | Temp 97.7°F | Resp 17

## 2019-03-24 DIAGNOSIS — C7951 Secondary malignant neoplasm of bone: Secondary | ICD-10-CM

## 2019-03-24 DIAGNOSIS — C50919 Malignant neoplasm of unspecified site of unspecified female breast: Secondary | ICD-10-CM

## 2019-03-24 DIAGNOSIS — C50111 Malignant neoplasm of central portion of right female breast: Secondary | ICD-10-CM

## 2019-03-24 DIAGNOSIS — D500 Iron deficiency anemia secondary to blood loss (chronic): Secondary | ICD-10-CM

## 2019-03-24 DIAGNOSIS — Z5112 Encounter for antineoplastic immunotherapy: Secondary | ICD-10-CM | POA: Diagnosis not present

## 2019-03-24 MED ORDER — KETOROLAC TROMETHAMINE 15 MG/ML IJ SOLN
INTRAMUSCULAR | Status: AC
  Filled 2019-03-24: qty 1

## 2019-03-24 MED ORDER — SODIUM CHLORIDE 0.9% FLUSH
10.0000 mL | INTRAVENOUS | Status: DC | PRN
  Administered 2019-03-24: 10 mL
  Filled 2019-03-24: qty 10

## 2019-03-24 MED ORDER — SODIUM CHLORIDE 0.9 % IV SOLN
Freq: Once | INTRAVENOUS | Status: AC
  Administered 2019-03-24: 11:00:00 via INTRAVENOUS
  Filled 2019-03-24: qty 250

## 2019-03-24 MED ORDER — HEPARIN SOD (PORK) LOCK FLUSH 100 UNIT/ML IV SOLN
500.0000 [IU] | Freq: Once | INTRAVENOUS | Status: AC | PRN
  Administered 2019-03-24: 500 [IU]
  Filled 2019-03-24: qty 5

## 2019-03-24 MED ORDER — HYDROMORPHONE HCL 2 MG PO TABS: 2.0000 mg | ORAL_TABLET | ORAL | 0 refills | Status: DC | PRN

## 2019-03-24 MED ORDER — MEGESTROL ACETATE 625 MG/5ML PO SUSP: 625.0000 mg | Freq: Every day | ORAL | 4 refills | Status: DC

## 2019-03-24 MED ORDER — KETOROLAC TROMETHAMINE 15 MG/ML IJ SOLN
15.0000 mg | Freq: Once | INTRAMUSCULAR | Status: AC
  Administered 2019-03-24: 15 mg via INTRAVENOUS

## 2019-03-24 NOTE — Progress Notes (Unsigned)
Tomeshia Pizzi Female, 28 y.o., 01-Jun-1990 MRN:  [id number] Phone:  [phone] Jerilynn Mages) PCP:  Lavella Lemons, PA Primary Cvg:  Medicaid [place]/Medicaid [place] [facility] 03/24/2019 at 10:30 AM  RE: STAT:  US Liver Biopsy Received: Today Message Contents  Arne Cleveland, MD  [place], Geary Rufo D        Ok   [place] core liver lesion biopsy  Prob breast CA met     DDH   Previous Messages  ----- Message -----  From: Garth Bigness D  Sent: 03/24/2019  8:30 AM EDT  To: Ir Procedure Requests  Subject: STAT:  US Liver Biopsy              Procedure:  US Liver Biopsy   Reason:  Breast cancer metastasized to bone, unspecified laterality, recurrent breast cancer -- only 28YO!! need core biopsies for HER-2 and genetic panel!!!   History: NM PET in computer   Dr. Marin Olp, [place]  [phone]

## 2019-03-24 NOTE — Progress Notes (Signed)
Hematology and Oncology Follow Up Visit  Sophia [name] [id number] 1990-12-08 28 y.o. 03/24/2019   Principle Diagnosis:  Metastatic breast cancer - BRCA (+) - ER-/PR-/HER2-  Vertebral metastases on 05/17/2018 -- TMB -low; MSI - stable; PIK3CA - wt Iron deficiency anemia   Past Therapy: Taxotere/Carboplatin/Herceptin/Perjeta -s/p cycle 4 S/P Bilateral mastectomies - 10/09/2017 Ovarian ablation with Lupron - stopped 11/06/2017 S/p TAH-BSO on 01/19/2018 Radiation Therapy -- back and ribs - started on 07/21/2018 Radiation Therapy --  Left pelvis - started on 01/17/2019 and completed 02/02/2019  Current Therapy:   Tecentriq/Avastin -- q 3 wk cycles --s/p cycle 8 - started 06/23/2018 -- d/c on 03/24/2019 Lynparza (Olapirib) 300 mg po BID -- start 06/23/2018 IV Iron as indicated -- d/c on 03/24/2019 Zometa 4 mg IV q 3 month -- next dose10/2020   Interim History:  Sophia [name] is here today for IV fluids.  Unfortunately, she had a PET scan that was done last week.  The PET scan clearly shows that she has significantly progressed cancer.  I am absolutely surprised by this.  I really would have thought that the Sophia [name] would have helped since her tumor is BRCA positive.  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  She has adenopathy.  I think the issue now is getting another biopsy.  We really need to see if her tumor is HER-2 positive or HER-2 negative.  This will really dictate how we can treat her.  I know that we have done molecular studies on her tumor.  Unfortunately, I think everything has been negative to date for [place] to target.  She is having pain.  She has a fentanyl patch on which really is not working.  I will try her on some Dilaudid to see if this will help.  We will try her on Dilaudid at 2 mg p.o. every 4 hours as needed.  I think it is apparent that she has lost weight.  She really has not been able to eat that much.  She says when she eats, she gets a lot of abdominal pain.   I do not know if this is from the hepatic metastasis.  I told her to make sure she takes in an acid.  I will also try her on some Megace to see that we cannot get her appetite low stimulated.  She is having some hot flashes so I will hope that the Megace will help with the hot flashes.  I know that she is trying her best.  I just feel bad that we are not seeing her cancer become much more aggressive.  Of note, her CA 27.29 has really never been elevated.  When we checked it 2 weeks ago, it was only 19.  Overall, I would have to say that her performance status is probably ECOG 1-2.    Medications:  Allergies as of 03/24/2019   No Known Allergies     Medication List       Accurate as of March 24, 2019  1:47 PM. If you have any questions, ask your nurse or doctor.        fentaNYL 12 MCG/HR Commonly known as: [place] 1 patch onto the skin every 3 (three) days.   metoCLOPramide 10 MG tablet Commonly known as: REGLAN Take 2 tablets (20 mg total) by mouth 3 (three) times daily before meals.   olaparib 150 MG tablet Commonly known as: LYNPARZA Take 2 tablets (300 mg total) by mouth 2 (two) times daily. Swallow whole.  ondansetron 8 MG disintegrating tablet Commonly known as: ZOFRAN-ODT Take 1 tablet (8 mg total) by mouth every 6 (six) hours as needed for nausea or vomiting.   Tecentriq 1200 MG/20ML Generic drug: atezolizumab Inject 20 mLs (1,200 mg total) into the vein every 21 ( twenty-one) days. Admix with 250 cc Normal Saline.  May administer with or without  .2 micron in line filter.  Not compatible with D5W   traMADol 50 MG tablet Commonly known as: ULTRAM Take 1 tablet (50 mg total) by mouth every 6 (six) hours as needed.       Allergies: No Known Allergies  Past Medical History, Surgical history, Social history, and Family History were reviewed and updated.  Review of Systems: Review of Systems  Constitutional: Negative.   HENT: Negative.   Eyes:  Negative.   Respiratory: Negative.   Cardiovascular: Negative.   Gastrointestinal: Negative.   Genitourinary: Negative.   Musculoskeletal: Negative.   Skin: Negative.   Neurological: Negative.   Endo/Heme/Allergies: Negative.   Psychiatric/Behavioral: Negative.      Physical Exam:  vitals were not taken for this visit.   Wt Readings from Last 3 Encounters:  03/10/19 151 lb (68.5 kg)  02/10/19 161 lb (73 kg)  01/25/19 161 lb (73 kg)    Physical Exam Vitals signs reviewed.  HENT:     Head: Normocephalic and atraumatic.  Eyes:     Pupils: Pupils are equal, round, and reactive to light.  Neck:     Musculoskeletal: Normal range of motion.  Cardiovascular:     Rate and Rhythm: Normal rate and regular rhythm.     Heart sounds: Normal heart sounds.  Pulmonary:     Effort: Pulmonary effort is normal.     Breath sounds: Normal breath sounds.  Abdominal:     General: Bowel sounds are normal.     Palpations: Abdomen is soft.  Musculoskeletal: Normal range of motion.        General: No tenderness or deformity.  Lymphadenopathy:     Cervical: No cervical adenopathy.  Skin:    General: Skin is warm and dry.     Findings: No erythema or rash.  Neurological:     Mental Status: She is alert and oriented to person, place, and time.  Psychiatric:        Behavior: Behavior normal.        Thought Content: Thought content normal.        Judgment: Judgment normal.      Lab Results  Component Value Date   WBC 5.4 03/10/2019   HGB 13.1 03/10/2019   HCT 39.1 03/10/2019   MCV 87.7 03/10/2019   PLT 91 (L) 03/10/2019   Lab Results  Component Value Date   FERRITIN 332 (H) 01/20/2019   IRON 59 01/20/2019   TIBC 235 (L) 01/20/2019   UIBC 176 01/20/2019   IRONPCTSAT 25 01/20/2019   Lab Results  Component Value Date   RBC 4.46 03/10/2019   No results found for: KPAFRELGTCHN, LAMBDASER, KAPLAMBRATIO No results found for: IGGSERUM, IGA, IGMSERUM No results found for:  TOTALPROTELP, ALBUMINELP, A1GS, A2GS, BETS, BETA2SER, GAMS, MSPIKE, SPEI   Chemistry      Component Value Date/Time   NA 133 (L) 03/10/2019 1120   K 3.8 03/10/2019 1120   CL 96 (L) 03/10/2019 1120   CO2 24 03/10/2019 1120   BUN 15 03/10/2019 1120   CREATININE 0.62 03/10/2019 1120      Component Value Date/Time   CALCIUM 10.2 03/10/2019  1120   ALKPHOS 179 (H) 03/10/2019 1120   AST 58 (H) 03/10/2019 1120   ALT 32 03/10/2019 1120   BILITOT 0.7 03/10/2019 1120       Impression and Plan: Sophia [name] is a very pleasant 28 yo caucasian female with metastatic breast cancer HER-2- and ER negative. She is BRCA positive.   I think the biopsy is going to be critical.  We will get the biopsy early next week.  We will see if the tumor is HER-2 positive.  If it does not HER-2 positive, we will try her on the newly approved FDA drug - Ivette Loyal -which is for recurrent triple negative breast cancer.  If her tumor is HER-2 positive, I will try her on EnHertu or Tucatinib/Xeloda/Herceptin.  I spoke with my breast cancer expert at [facility].  She does not think there are any protocols that they have right now that she would qualify for.  We are really trying hard to get this cancer under better control.  It is truly shown itself to be quite resilient.  I am not sure as to why but it is not going to "give up" that easily.  Thankfully, Sophia [name] is incredibly tough and I know she will try her best to do what is necessary to have a long, active, and functional life.  We will plan to get her back to see [place] when we get the results back from the biopsy.    I spent about 40-45 minutes with her today.  I had a talk to her about the change in our protocol.  We went over the PET scan.    Volanda Napoleon, MD 10/29/20201:47 PM

## 2019-03-24 NOTE — Patient Instructions (Signed)
Dehydration, Adult  Dehydration is when there is not enough fluid or water in your body. This happens when you lose more fluids than you take in. Dehydration can range from mild to very bad. It should be treated right away to keep it from getting very bad. Symptoms of mild dehydration may include:  Thirst.  Dry lips.  Slightly dry mouth.  Dry, warm skin.  Dizziness. Symptoms of moderate dehydration may include:  Very dry mouth.  Muscle cramps.  Dark pee (urine). Pee may be the color of tea.  Your body making less pee.  Your eyes making fewer tears.  Heartbeat that is uneven or faster than normal (palpitations).  Headache.  Light-headedness, especially when you stand up from sitting.  Fainting (syncope). Symptoms of very bad dehydration may include:  Changes in skin, such as: ? Cold and clammy skin. ? Blotchy (mottled) or pale skin. ? Skin that does not quickly return to normal after being lightly pinched and let go (poor skin turgor).  Changes in body fluids, such as: ? Feeling very thirsty. ? Your eyes making fewer tears. ? Not sweating when body temperature is high, such as in hot weather. ? Your body making very little pee.  Changes in vital signs, such as: ? Weak pulse. ? Pulse that is more than 100 beats a minute when you are sitting still. ? Fast breathing. ? Low blood pressure.  Other changes, such as: ? Sunken eyes. ? Cold hands and feet. ? Confusion. ? Lack of energy (lethargy). ? Trouble waking up from sleep. ? Short-term weight loss. ? Unconsciousness. Follow these instructions at home:   If told by your doctor, drink an ORS: ? Make an ORS by using instructions on the package. ? Start by drinking small amounts, about  cup (120 mL) every 5-10 minutes. ? Slowly drink more until you have had the amount that your doctor said to have.  Drink enough clear fluid to keep your pee clear or pale yellow. If you were told to drink an ORS, finish the  ORS first, then start slowly drinking clear fluids. Drink fluids such as: ? Water. Do not drink only water by itself. Doing that can make the salt (sodium) level in your body get too low (hyponatremia). ? Ice chips. ? Fruit juice that you have added water to (diluted). ? Low-calorie sports drinks.  Avoid: ? Alcohol. ? Drinks that have a lot of sugar. These include high-calorie sports drinks, fruit juice that does not have water added, and soda. ? Caffeine. ? Foods that are greasy or have a lot of fat or sugar.  Take over-the-counter and prescription medicines only as told by your doctor.  Do not take salt tablets. Doing that can make the salt level in your body get too high (hypernatremia).  Eat foods that have minerals (electrolytes). Examples include bananas, oranges, potatoes, tomatoes, and spinach.  Keep all follow-up visits as told by your doctor. This is important. Contact a doctor if:  You have belly (abdominal) pain that: ? Gets worse. ? Stays in one area (localizes).  You have a rash.  You have a stiff neck.  You get angry or annoyed more easily than normal (irritability).  You are more sleepy than normal.  You have a harder time waking up than normal.  You feel: ? Weak. ? Dizzy. ? Very thirsty.  You have peed (urinated) only a small amount of very dark pee during 6-8 hours. Get help right away if:  You have   symptoms of very bad dehydration.  You cannot drink fluids without throwing up (vomiting).  Your symptoms get worse with treatment.  You have a fever.  You have a very bad headache.  You are throwing up or having watery poop (diarrhea) and it: ? Gets worse. ? Does not go away.  You have blood or something green (bile) in your throw-up.  You have blood in your poop (stool). This may cause poop to look black and tarry.  You have not peed in 6-8 hours.  You pass out (faint).  Your heart rate when you are sitting still is more than 100 beats a  minute.  You have trouble breathing. This information is not intended to replace advice given to you by your health care provider. Make sure you discuss any questions you have with your health care provider. Document Released: 03/08/2009 Document Revised: 04/24/2017 Document Reviewed: 07/06/2015 Elsevier Patient Education  2020 Elsevier Inc.  

## 2019-03-25 ENCOUNTER — Other Ambulatory Visit: Payer: Self-pay | Admitting: *Deleted

## 2019-03-25 ENCOUNTER — Encounter: Payer: Self-pay | Admitting: Hematology & Oncology

## 2019-03-25 ENCOUNTER — Encounter: Payer: Self-pay | Admitting: *Deleted

## 2019-03-25 DIAGNOSIS — C50919 Malignant neoplasm of unspecified site of unspecified female breast: Secondary | ICD-10-CM

## 2019-03-25 DIAGNOSIS — G8929 Other chronic pain: Secondary | ICD-10-CM

## 2019-03-25 DIAGNOSIS — M545 Low back pain, unspecified: Secondary | ICD-10-CM

## 2019-03-25 MED ORDER — FENTANYL 25 MCG/HR TD PT72: 1.0000 | MEDICATED_PATCH | TRANSDERMAL | 0 refills | Status: DC

## 2019-03-27 ENCOUNTER — Other Ambulatory Visit: Payer: Self-pay | Admitting: Radiology

## 2019-03-28 ENCOUNTER — Other Ambulatory Visit: Payer: Self-pay | Admitting: Student

## 2019-03-29 ENCOUNTER — Ambulatory Visit (HOSPITAL_COMMUNITY)
Admission: RE | Admit: 2019-03-29 | Discharge: 2019-03-29 | Disposition: A | Discharge status: Home / Self Care | Payer: Medicaid Other | Source: Ambulatory Visit | Attending: Hematology & Oncology | Admitting: Hematology & Oncology

## 2019-03-29 ENCOUNTER — Encounter (HOSPITAL_COMMUNITY): Payer: Self-pay

## 2019-03-29 ENCOUNTER — Other Ambulatory Visit: Payer: Self-pay

## 2019-03-29 DIAGNOSIS — C7951 Secondary malignant neoplasm of bone: Secondary | ICD-10-CM | POA: Insufficient documentation

## 2019-03-29 DIAGNOSIS — Z79899 Other long term (current) drug therapy: Secondary | ICD-10-CM | POA: Insufficient documentation

## 2019-03-29 DIAGNOSIS — R109 Unspecified abdominal pain: Secondary | ICD-10-CM | POA: Insufficient documentation

## 2019-03-29 DIAGNOSIS — Z853 Personal history of malignant neoplasm of breast: Secondary | ICD-10-CM | POA: Insufficient documentation

## 2019-03-29 DIAGNOSIS — C787 Secondary malignant neoplasm of liver and intrahepatic bile duct: Secondary | ICD-10-CM | POA: Diagnosis not present

## 2019-03-29 DIAGNOSIS — C50911 Malignant neoplasm of unspecified site of right female breast: Secondary | ICD-10-CM | POA: Diagnosis present

## 2019-03-29 DIAGNOSIS — C50919 Malignant neoplasm of unspecified site of unspecified female breast: Secondary | ICD-10-CM

## 2019-03-29 LAB — CBC
HCT: 42.3 % (ref 36.0–46.0)
Hemoglobin: 13.1 g/dL (ref 12.0–15.0)
MCH: 28.5 pg (ref 26.0–34.0)
MCHC: 31 g/dL (ref 30.0–36.0)
MCV: 92 fL (ref 80.0–100.0)
Platelets: 121 10*3/uL — ABNORMAL LOW (ref 150–400)
RBC: 4.6 MIL/uL (ref 3.87–5.11)
RDW: 14.4 % (ref 11.5–15.5)
WBC: 4.6 10*3/uL (ref 4.0–10.5)
nRBC: 0 % (ref 0.0–0.2)

## 2019-03-29 LAB — PROTIME-INR
INR: 1.1 (ref 0.8–1.2)
Prothrombin Time: 13.9 seconds (ref 11.4–15.2)

## 2019-03-29 LAB — PREGNANCY, URINE: Preg Test, Ur: NEGATIVE

## 2019-03-29 MED ORDER — GELATIN ABSORBABLE 12-7 MM EX MISC
CUTANEOUS | Status: AC
  Filled 2019-03-29: qty 1

## 2019-03-29 MED ORDER — FENTANYL CITRATE (PF) 100 MCG/2ML IJ SOLN
INTRAMUSCULAR | Status: AC | PRN
  Administered 2019-03-29 (×2): 25 ug via INTRAVENOUS

## 2019-03-29 MED ORDER — LIDOCAINE HCL (PF) 1 % IJ SOLN
INTRAMUSCULAR | Status: AC
  Filled 2019-03-29: qty 30

## 2019-03-29 MED ORDER — MIDAZOLAM HCL 2 MG/2ML IJ SOLN
INTRAMUSCULAR | Status: AC
  Filled 2019-03-29: qty 2

## 2019-03-29 MED ORDER — MIDAZOLAM HCL 2 MG/2ML IJ SOLN
INTRAMUSCULAR | Status: AC | PRN
  Administered 2019-03-29 (×2): 0.5 mg via INTRAVENOUS

## 2019-03-29 MED ORDER — FENTANYL CITRATE (PF) 100 MCG/2ML IJ SOLN
INTRAMUSCULAR | Status: AC
  Filled 2019-03-29: qty 2

## 2019-03-29 MED ORDER — SODIUM CHLORIDE 0.9 % IV SOLN: INTRAVENOUS | Status: DC

## 2019-03-29 NOTE — Discharge Instructions (Addendum)
Liver Biopsy ° °The liver is a large organ in the upper right side of the abdomen. A liver biopsy is a procedure in which a tissue sample is taken from the liver and examined under a microscope. °There are three types of liver biopsies: °· Percutaneous. A needle is used to remove a sample through an incision in your abdomen. °· Laparoscopic. Several incisions are made in the abdomen. A sample is removed with the help of a tiny camera. °· Transjugular. An incision is made in your neck in the area of the jugular vein. A sample is removed through a small flexible tube that is passed down the blood vessel and into your liver. °Tell a health care provider about: °· Any allergies you have. °· All medicines you are taking, including vitamins, herbs, eye drops, creams, and over-the-counter medicines. °· Any problems you or family members have had with anesthetic medicines. °· Any blood disorders you have. °· Any surgeries you have had. °· Any medical conditions you have. °· Whether you are pregnant or may be pregnant. °What are the risks? °Generally, this is a safe procedure. However, problems can occur and include: °· Bleeding. °· Infection. °· Bruising. °· Pain. °· Injury to nearby organs or tissues, such as nerves, gallbladder, liver, or lungs. °What happens before the procedure? °Eating and drinking restrictions °· You may be asked not to drink or eat for 6-8 hours before the liver biopsy. You may be allowed to eat a light breakfast. Talk to your health care provider about when you should stop eating and drinking. °Medicines °Ask your health care provider about: °· Changing or stopping your regular medicines. This is especially important if you are taking diabetes medicines or blood thinners. °· Taking medicines such as aspirin and ibuprofen. These medicines can thin your blood. Do not take these medicines unless your health care provider tells you to take them. °· Taking over-the-counter medicines, vitamins, herbs, and  supplements. °General instructions °· Do not use any products that contain nicotine or tobacco, such as cigarettes and e-cigarettes. If you need help quitting, ask your health care provider. °· Plan to have someone take you home from the hospital or clinic. °· Plan to have a responsible adult care for you for at least 24 hours after you leave the hospital or clinic. This is important. °· You may have blood or urine tests. °· Ask your health care provider what steps will be taken to prevent infection. These may include: °? Removing hair at the surgery site. °? Washing skin with a germ-killing soap. °? Taking antibiotic medicine. °What happens during the procedure? °· An IV will be inserted into one of your veins. °? You will be given one or more of the following: °? A medicine to help you relax (sedative). °? A medicine to numb the area (local anesthetic). °? A medicine to make you fall asleep (general anesthetic). °· Your health care provider will use one of the following procedures to remove samples from your liver. These procedures may vary among health care providers and hospitals. °Percutaneous liver biopsy °· You will lie on your back, with your right hand over your head. °· A health care provider will locate your liver by tapping and pressing on the right side of your abdomen, or by using an ultrasound or CT scan. °· A local anesthetic will be used to numb an area at the bottom of your last right rib. °· A small incision will be made in the numbed area. °·   A biopsy needle will be inserted into the incision. °· Several samples of liver tissue will be taken. You will be asked to hold your breath as each sample is taken. °· The incision will be closed with stitches (sutures). °· A bandage (dressing) may be placed over the incision. °Laparoscopic liver biopsy °· You will lie on your back. °· Several small incisions will be made in your abdomen. °· Your health care provider will pass a tiny camera through one  incision. The camera will allow the liver to be viewed on a TV monitor in the operating room. °· Tools will be passed through the other incision or incisions. °· Samples of the liver will be removed using the tools. °· The incisions will be closed with stitches (sutures). °· A bandage (dressing) may be placed over the incisions. °Transjugular liver biopsy °· You will lie on your back on an X-ray table, with your head turned to your left. °· An area on your neck, just over your jugular vein, will be numbed. °· An incision will be made in the numbed area. °· A tiny tube will be inserted through the incision. The tube will be passed into the jugular vein to a blood vessel in the liver called the hepatic vein. °· A dye will be injected through the tube. °· X-rays will be taken. The dye will make the blood vessels in the liver light up on the X-rays. °· The biopsy needle will be placed through the tube until it reaches the liver. °· Samples of liver tissue will be taken with the biopsy needle. °· The needle and the tube will be removed. °· The incision will be closed with stitches (sutures). °· A bandage (dressing) may be placed over the incision. °What happens after the procedure? °· Your blood pressure, heart rate, breathing rate, and blood oxygen level will be monitored until you leave the hospital or clinic. °· You will be asked to rest quietly for 2-4 hours or longer. °· You will be closely monitored for bleeding from the biopsy site. °· You may be allowed to go home when the medicines have worn off and you can walk, drink, eat, and use the bathroom. °Summary °· A liver biopsy is a procedure in which a tissue sample is taken from the liver and examined under a microscope. °· This is a safe procedure, but problems can occur, including bleeding, infection, pain, or injury to nearby organs or tissues. °· Ask your health care provider about changing or stopping your regular medicines. °· Plan to have someone take you  home from the hospital or clinic and to be with you for 24 hours after the procedure. °This information is not intended to replace advice given to you by your health care provider. Make sure you discuss any questions you have with your health care provider. °Document Released: 08/02/2003 Document Revised: 05/22/2017 Document Reviewed: 05/22/2017 °Elsevier Patient Education © 2020 [employer] Inc[employer] °Moderate Conscious Sedation, Adult, Care After °These instructions provide you with information about caring for yourself after your procedure. Your health care provider may also give you more specific instructions. Your treatment has been planned according to current medical practices, but problems sometimes occur. Call your health care provider if you have any problems or questions after your procedure. °What can I expect after the procedure? °After your procedure, it is common: °· To feel sleepy for several hours. °· To feel clumsy and have poor balance for several hours. °· To have poor judgment for   several hours. °· To vomit if you eat too soon. °Follow these instructions at home: °For at least 24 hours after the procedure: ° °· Do not: °? Participate in activities where you could fall or become injured. °? Drive. °? Use heavy machinery. °? Drink alcohol. °? Take sleeping pills or medicines that cause drowsiness. °? Make important decisions or sign legal documents. °? Take care of children on your own. °· Rest. °Eating and drinking °· Follow the diet recommended by your health care provider. °· If you vomit: °? Drink water, juice, or soup when you can drink without vomiting. °? Make sure you have little or no nausea before eating solid foods. °General instructions °· Have a responsible adult stay with you until you are awake and alert. °· Take over-the-counter and prescription medicines only as told by your health care provider. °· If you smoke, do not smoke without supervision. °· Keep all follow-up visits as told by  your health care provider. This is important. °Contact a health care provider if: °· You keep feeling nauseous or you keep vomiting. °· You feel light-headed. °· You develop a rash. °· You have a fever. °Get help right away if: °· You have trouble breathing. °This information is not intended to replace advice given to you by your health care provider. Make sure you discuss any questions you have with your health care provider. °Document Released: 03/02/2013 Document Revised: 04/24/2017 Document Reviewed: 09/01/2015 °Elsevier Patient Education © 2020 [employer]vier Inc[employer] ° °

## 2019-03-29 NOTE — Progress Notes (Signed)
Called and went over D/C instructions with patient's mother Thayer Headings).

## 2019-03-29 NOTE — H&P (Signed)
Chief Complaint: Patient was seen in consultation today for liver lesion biopsy at the request of Ennever,Peter R  Referring Physician(s): Ennever,Peter R  Supervising Physician: Corrie Mckusick  Patient Status: [facility] - Out-pt  History of Present Illness: Sophia [name] is a 28 y.o. female   Hx Breast Ca-- bony mets Dx 2018 Follows with Dr Marin Olp  New finding of liver lesion on recent follow up PET scan 03/21/19: IMPRESSION: 1. Moderate disease progression, primarily within the abdomen, as evidenced by new liver, probable nodal, and peritoneal metastasis. 2. Thoracic nodal and chest wall disease, mildly progressive. 3. Mild osseous progression, including increasingly distinct left femoral head lesion which may predispose the patient to pathologic fracture.   Scheduled now for liver lesion biopsy  Past Medical History:  Diagnosis Date  . BRCA1 positive   . Breast cancer ([place]) 05/2017   +BRCA1  . Breast cancer metastasized to bone, unspecified laterality ([place]) 05/17/2018  . Depression    no meds  . Family history of breast cancer   . History of breast cancer    right  . History of chemotherapy    finished 08/21/2017  . Port-A-Cath in place     Past Surgical History:  Procedure Laterality Date  . BREAST RECONSTRUCTION WITH PLACEMENT OF TISSUE EXPANDER AND FLEX HD (ACELLULAR HYDRATED DERMIS) Bilateral 10/09/2017   Procedure: BILATERAL BREAST RECONSTRUCTION WITH PLACEMENT OF TISSUE EXPANDER AND FLEX HD (ACELLULAR HYDRATED DERMIS);  Surgeon: [name], MD;  Location: [place];  Service: Plastics;  Laterality: Bilateral;  . HYSTEROSCOPY W/D&C N/A 01/19/2018   Procedure: DILATATION & CURETTAGE/HYSTEROSCOPY;  Surgeon: Megan Salon, MD;  Location: [place] [facility];  Service: Gynecology;  Laterality: N/A;  . IR FLUORO GUIDE PORT INSERTION RIGHT  06/18/2017  . IR US GUIDE VASC ACCESS RIGHT  06/18/2017  . LAPAROSCOPIC BILATERAL SALPINGO OOPHERECTOMY Bilateral  01/19/2018   Procedure: LAPAROSCOPIC BILATERAL SALPINGO OOPHORECTOMY;  Surgeon: Megan Salon, MD;  Location: [place] [facility];  Service: Gynecology;  Laterality: Bilateral;  . MASTECTOMY MODIFIED RADICAL Right 10/09/2017   Procedure: MASTECTOMY MODIFIED RADICAL;  Surgeon: Fanny Skates, MD;  Location: [place];  Service: General;  Laterality: Right;  . REMOVAL OF BILATERAL TISSUE EXPANDERS WITH PLACEMENT OF BILATERAL BREAST IMPLANTS Bilateral 03/30/2018   Procedure: REMOVAL OF BILATERAL TISSUE EXPANDERS WITH PLACEMENT OF BILATERAL BREAST IMPLANTS;  Surgeon: [name], MD;  Location: [place];  Service: Plastics;  Laterality: Bilateral;  . TOTAL MASTECTOMY Left 10/09/2017   Procedure: LEFT PROPHYLATIC MASTECTOMY;  Surgeon: Fanny Skates, MD;  Location: [place];  Service: General;  Laterality: Left;    Allergies: Patient has no known allergies.  Medications: Prior to Admission medications   Medication Sig Start Date End Date Taking? Authorizing Provider  atezolizumab (TECENTRIQ) 1200 MG/20ML Inject 20 mLs (1,200 mg total) into the vein every 21 ( twenty-one) days. Admix with 250 cc Normal Saline.  May administer with or without  .2 micron in line filter.  Not compatible with D5W 12/14/18  Yes Ennever, Rudell Cobb, MD  fentaNYL (DURAGESIC) 25 MCG/HR Place 1 patch onto the skin every 3 (three) days. 03/25/19  Yes Volanda Napoleon, MD  HYDROmorphone (DILAUDID) 2 MG tablet Take 1 tablet (2 mg total) by mouth every 4 (four) hours as needed for up to 7 days for severe pain. 03/24/19 03/31/19 Yes Ennever, Rudell Cobb, MD  olaparib (LYNPARZA) 150 MG tablet Take 2 tablets (300 mg total) by mouth 2 (two) times daily. Swallow whole.  01/20/19  Yes Volanda Napoleon, MD  ondansetron (ZOFRAN-ODT) 8 MG disintegrating tablet Take 1 tablet (8 mg total) by mouth every 6 (six) hours as needed for nausea or vomiting. 02/10/19  Yes [place], Holli Humbles, NP  megestrol (MEGACE ES)  625 MG/5ML suspension Take 5 mLs (625 mg total) by mouth daily. Patient not taking: Reported on 03/28/2019 03/24/19   Volanda Napoleon, MD  metoCLOPramide (REGLAN) 10 MG tablet Take 2 tablets (20 mg total) by mouth 3 (three) times daily before meals. Patient not taking: Reported on 03/28/2019 03/11/19   Volanda Napoleon, MD  traMADol (ULTRAM) 50 MG tablet Take 1 tablet (50 mg total) by mouth every 6 (six) hours as needed. Patient not taking: Reported on 03/28/2019 03/01/19   [place], Holli Humbles, NP     Family History  Problem Relation Age of Onset  . Breast cancer Maternal Grandmother 60       metastatic  . Breast cancer Other     Social History   Socioeconomic History  . Marital status: Single    Spouse name: Not on file  . Number of children: 2  . Years of education: Not on file  . Highest education level: Not on file  Occupational History  . Not on file  Social Needs  . Financial resource strain: Not on file  . Food insecurity    Worry: Not on file    Inability: Not on file  . Transportation needs    Medical: Not on file    Non-medical: Not on file  Tobacco Use  . Smoking status: Never Smoker  . Smokeless tobacco: Never Used  Substance and Sexual Activity  . Alcohol use: Yes    Comment: rarely  . Drug use: Never  . Sexual activity: Not Currently    Birth control/protection: Surgical  Lifestyle  . Physical activity    Days per week: Not on file    Minutes per session: Not on file  . Stress: Not on file  Relationships  . Social Herbalist on phone: Not on file    Gets together: Not on file    Attends religious service: Not on file    Active member of club or organization: Not on file    Attends meetings of clubs or organizations: Not on file    Relationship status: Not on file  Other Topics Concern  . Not on file  Social History Narrative  . Not on file    Review of Systems: A 12 point ROS discussed and pertinent positives are indicated in the HPI  above.  All other systems are negative.  Review of Systems  Constitutional: Positive for appetite change and fatigue. Negative for fever.  Respiratory: Negative for cough and shortness of breath.   Cardiovascular: Negative for chest pain.  Gastrointestinal: Positive for abdominal pain.  Musculoskeletal: Negative for back pain.  Neurological: Negative for weakness.  Psychiatric/Behavioral: Negative for behavioral problems and confusion.    Vital Signs: BP 102/77   Pulse 94   Temp (!) 97.3 F (36.3 C) (Skin)   Resp 18   Ht _0  (1.753 m)   Wt 145 lb (65.8 kg)   SpO2 99%   BMI 21.41 kg/m   Physical Exam Vitals signs reviewed.  Cardiovascular:     Rate and Rhythm: Normal rate and regular rhythm.     Heart sounds: Normal heart sounds.  Pulmonary:     Effort: Pulmonary effort is normal.  Breath sounds: Normal breath sounds.  Abdominal:     Palpations: Abdomen is soft.     Tenderness: There is no abdominal tenderness.  Musculoskeletal: Normal range of motion.  Skin:    General: Skin is warm and dry.  Neurological:     Mental Status: She is alert and oriented to person, place, and time.  Psychiatric:        Mood and Affect: Mood normal.        Behavior: Behavior normal.        Thought Content: Thought content normal.        Judgment: Judgment normal.     Imaging: Nm Pet Image Restag (ps) Skull Base To Thigh  Result Date: 03/21/2019 CLINICAL DATA:  Subsequent treatment strategy for restaging of metastatic breast cancer to bone. EXAM: NUCLEAR MEDICINE PET SKULL BASE TO THIGH TECHNIQUE: 7.3 mCi F-18 FDG was injected intravenously. Full-ring PET imaging was performed from the skull base to thigh after the radiotracer. CT data was obtained and used for attenuation correction and anatomic localization. Fasting blood glucose: 75 mg/dl COMPARISON:  12/07/2018 FINDINGS: Mediastinal blood pool activity: SUV max 2.5 Liver activity: SUV max NA NECK: Left supraclavicular node  measures 1.1 cm and a S.U.V. max of 9.2 on 44/4. Compare 9 mm and a S.U.V. max of 6.5 on the prior. Incidental CT findings: No cervical adenopathy. CHEST: Right internal mammary or prevascular nodal metastasis measures a S.U.V. max of 8.6 today including on 62/4. Compare a S.U.V. max of 7.1 on the prior. Medial right chest wall metastasis, deep to the medial side of the right breast implant measures a S.U.V. max of 8.7 today versus a S.U.V. max of 9.4 on the prior. Soft tissue density deep to the right breast implant measures 1.2 cm and a S.U.V. max of 16.6 today versus similar in size and a S.U.V. max of 16.7 on the prior. A new medial left chest wall implant measures 8 mm and a S.U.V. max of 5.9 on 70/4. Incidental CT findings: Right Port-A-Cath tip at mid right atrium. Right axillary node dissection. ABDOMEN/PELVIS: Peripheral hepatic hypermetabolic metastasis, new. For example, left-sided hypermetabolic mass measures 9.2 x 6.1 cm and a S.U.V. max of 23.4 on 90/4. Capsular based right-sided hypermetabolic (possibly peritoneal rather than hepatic parenchymal) lesion measures 3.9 x 2.4 cm and a S.U.V. max of 19.6 on image 99/4. New gastrohepatic ligament nodal versus less likely pancreatic body metastasis measures 1.5 cm and a S.U.V. max of 13.9 on 101/4. New right paracolic gutter soft tissue nodule of 1.5 cm and a S.U.V. max of 8.6 on 132/4. Incidental CT findings: No bowel obstruction. Normal adrenal glands. Small volume cul-de-sac fluid may be physiologic. SKELETON: Widespread osseous metastasis. An index T7 lesion is eccentric left, measures 1.4 cm, and a S.U.V. max of 9.5 today. Similar in size and a S.U.V. max of 10.0 on the prior. Medial left iliac lesion measures a S.U.V. max of 4.2 today versus a S.U.V. max of 4.5 on the prior. A lytic lesion within the posterior sacrum has enlarged, including at 3.8 cm on 150/4. Measures a S.U.V. max of 11.9 today versus a S.U.V. max of 12.3 on the prior. Incidental CT  findings: Heterogeneous lytic lesion within the femoral head, including on 174/4 is more distinct today could be dispose the patient to pathologic fracture. Vertebral augmentation at lower thoracic levels. IMPRESSION: 1. Moderate disease progression, primarily within the abdomen, as evidenced by new liver, probable nodal, and peritoneal metastasis. 2. Thoracic nodal and  chest wall disease, mildly progressive. 3. Mild osseous progression, including increasingly distinct left femoral head lesion which may predispose the patient to pathologic fracture. Electronically Signed   By: Abigail Miyamoto M.D.   On: 03/21/2019 13:56    Labs:  CBC: Recent Labs    12/30/18 1040 01/20/19 1030 02/10/19 0950 03/10/19 1120  WBC 4.1 4.9 3.9* 5.4  HGB 12.9 13.2 13.1 13.1  HCT 39.1 39.5 39.1 39.1  PLT 82* 87* 83* 91*    COAGS: Recent Labs    06/24/18 0953  INR 1.01    BMP: Recent Labs    12/30/18 1040 01/20/19 1030 02/10/19 0950 03/10/19 1120  NA 137 136 137 133*  K 4.1 4.1 4.0 3.8  CL 103 102 101 96*  CO2 _0 GLUCOSE 84 89 122* 100*  BUN _1 CALCIUM 9.3 9.7 10.1 10.2  CREATININE 0.65 0.65 0.73 0.62  GFRNONAA >60 >60 >60 >60  GFRAA >60 >60 >60 >60    LIVER FUNCTION TESTS: Recent Labs    12/30/18 1040 01/20/19 1030 02/10/19 0950 03/10/19 1120  BILITOT 0.7 0.6 0.7 0.7  AST 39 45* 44* 58*  ALT 30 36 32 32  ALKPHOS 153* 170* 148* 179*  PROT 7.1 7.7 7.5 8.0  ALBUMIN 3.9 4.1 4.0 4.3    TUMOR MARKERS: No results for input(s): AFPTM, CEA, CA199, CHROMGRNA in the last 8760 hours.  Assessment and Plan:  Known Breast C with Bony mets -- 2018 New +PET liver lesion Now for biopsy of same Risks and benefits of liver lesion biopsy was discussed with the patient and/or patient's family including, but not limited to bleeding, infection, damage to adjacent structures or low yield requiring additional tests.  All of the questions were answered and there is agreement to  proceed.  Consent signed and in chart.   Thank you for this interesting consult.  I greatly enjoyed meeting Emrey Thornley and look forward to participating in their care.  A copy of this report was sent to the requesting provider on this date.  Electronically Signed: Lavonia Drafts, PA-C 03/29/2019, 11:58 AM   I spent a total of   30 minutes  in face to face in clinical consultation, greater than 50% of which was counseling/coordinating care for liver lesion bx

## 2019-03-29 NOTE — Procedures (Signed)
Interventional Radiology Procedure Note  Procedure: US guided liver mass biopsy.  Complications: None Recommendations:  - Ok to shower tomorrow - Do not submerge for 7 days - Routine care  - advance diet - 2 hr dc home  Signed,  Dulcy Fanny. Earleen Newport, DO

## 2019-03-31 ENCOUNTER — Other Ambulatory Visit: Payer: Self-pay

## 2019-03-31 ENCOUNTER — Other Ambulatory Visit: Payer: Self-pay | Admitting: Hematology & Oncology

## 2019-03-31 ENCOUNTER — Inpatient Hospital Stay: Payer: Medicaid Other | Attending: Hematology & Oncology | Admitting: Family

## 2019-03-31 ENCOUNTER — Inpatient Hospital Stay: Payer: Medicaid Other

## 2019-03-31 ENCOUNTER — Encounter: Payer: Self-pay | Admitting: Family

## 2019-03-31 VITALS — BP 103/74 | HR 99 | Temp 97.0°F | Resp 18 | Wt 146.0 lb

## 2019-03-31 DIAGNOSIS — Z9013 Acquired absence of bilateral breasts and nipples: Secondary | ICD-10-CM | POA: Diagnosis not present

## 2019-03-31 DIAGNOSIS — Z5112 Encounter for antineoplastic immunotherapy: Secondary | ICD-10-CM | POA: Diagnosis present

## 2019-03-31 DIAGNOSIS — C7951 Secondary malignant neoplasm of bone: Secondary | ICD-10-CM | POA: Diagnosis not present

## 2019-03-31 DIAGNOSIS — C50919 Malignant neoplasm of unspecified site of unspecified female breast: Secondary | ICD-10-CM

## 2019-03-31 DIAGNOSIS — C787 Secondary malignant neoplasm of liver and intrahepatic bile duct: Secondary | ICD-10-CM | POA: Insufficient documentation

## 2019-03-31 DIAGNOSIS — K5900 Constipation, unspecified: Secondary | ICD-10-CM | POA: Diagnosis not present

## 2019-03-31 DIAGNOSIS — E860 Dehydration: Secondary | ICD-10-CM | POA: Diagnosis not present

## 2019-03-31 DIAGNOSIS — D500 Iron deficiency anemia secondary to blood loss (chronic): Secondary | ICD-10-CM

## 2019-03-31 DIAGNOSIS — R52 Pain, unspecified: Secondary | ICD-10-CM

## 2019-03-31 DIAGNOSIS — R634 Abnormal weight loss: Secondary | ICD-10-CM | POA: Insufficient documentation

## 2019-03-31 DIAGNOSIS — C50111 Malignant neoplasm of central portion of right female breast: Secondary | ICD-10-CM

## 2019-03-31 DIAGNOSIS — K5903 Drug induced constipation: Secondary | ICD-10-CM

## 2019-03-31 LAB — CMP (CANCER CENTER ONLY)
ALT: 22 U/L (ref 0–44)
AST: 70 U/L — ABNORMAL HIGH (ref 15–41)
Albumin: 3.7 g/dL (ref 3.5–5.0)
Alkaline Phosphatase: 190 U/L — ABNORMAL HIGH (ref 38–126)
Anion gap: 9 (ref 5–15)
BUN: 10 mg/dL (ref 6–20)
CO2: 29 mmol/L (ref 22–32)
Calcium: 9.7 mg/dL (ref 8.9–10.3)
Chloride: 98 mmol/L (ref 98–111)
Creatinine: 0.52 mg/dL (ref 0.44–1.00)
GFR, Est AFR Am: >60 mL/min (ref >60)
GFR, Estimated: >60 mL/min (ref >60)
Glucose, Bld: 115 mg/dL — ABNORMAL HIGH (ref 70–99)
Potassium: 3.5 mmol/L (ref 3.5–5.1)
Sodium: 136 mmol/L (ref 135–145)
Total Bilirubin: 0.8 mg/dL (ref 0.3–1.2)
Total Protein: 7 g/dL (ref 6.5–8.1)

## 2019-03-31 LAB — CBC WITH DIFFERENTIAL (CANCER CENTER ONLY)
Abs Immature Granulocytes: 0.01 10*3/uL (ref 0.00–0.07)
Basophils Absolute: 0 10*3/uL (ref 0.0–0.1)
Basophils Relative: 0 %
Eosinophils Absolute: 0.3 10*3/uL (ref 0.0–0.5)
Eosinophils Relative: 9 %
HCT: 35.5 % — ABNORMAL LOW (ref 36.0–46.0)
Hemoglobin: 11.2 g/dL — ABNORMAL LOW (ref 12.0–15.0)
Immature Granulocytes: 0 %
Lymphocytes Relative: 13 %
Lymphs Abs: 0.5 10*3/uL — ABNORMAL LOW (ref 0.7–4.0)
MCH: 28.6 pg (ref 26.0–34.0)
MCHC: 31.5 g/dL (ref 30.0–36.0)
MCV: 90.6 fL (ref 80.0–100.0)
Monocytes Absolute: 0.4 10*3/uL (ref 0.1–1.0)
Monocytes Relative: 11 %
Neutro Abs: 2.7 10*3/uL (ref 1.7–7.7)
Neutrophils Relative %: 67 %
Platelet Count: 87 10*3/uL — ABNORMAL LOW (ref 150–400)
RBC: 3.92 MIL/uL (ref 3.87–5.11)
RDW: 14.4 % (ref 11.5–15.5)
WBC Count: 4 10*3/uL (ref 4.0–10.5)
nRBC: 0 % (ref 0.0–0.2)

## 2019-03-31 MED ORDER — HYDROMORPHONE HCL 2 MG PO TABS: 2.0000 mg | ORAL_TABLET | ORAL | 0 refills | Status: AC | PRN

## 2019-03-31 MED ORDER — LACTULOSE 20 GM/30ML PO SOLN: 15.0000 mL | Freq: Every day | ORAL | 1 refills | Status: AC | PRN

## 2019-03-31 MED ORDER — HEPARIN SOD (PORK) LOCK FLUSH 100 UNIT/ML IV SOLN
500.0000 [IU] | Freq: Once | INTRAVENOUS | Status: AC | PRN
  Administered 2019-03-31: 500 [IU]
  Filled 2019-03-31: qty 5

## 2019-03-31 MED ORDER — SODIUM CHLORIDE 0.9% FLUSH
10.0000 mL | INTRAVENOUS | Status: DC | PRN
  Administered 2019-03-31: 10 mL
  Filled 2019-03-31: qty 10

## 2019-03-31 MED FILL — HYDROmorphone HCL 2 MG TABS: 2 | 25 days supply | Qty: 150 | Fill #0

## 2019-03-31 MED FILL — GENERLAC 10 GM/15 ML SOLN: 10 | 30 days supply | Qty: 450 | Fill #0

## 2019-03-31 NOTE — Addendum Note (Signed)
Addended by: Burney Gauze R on: 03/31/2019 05:02 PM   Modules accepted: Orders

## 2019-03-31 NOTE — Progress Notes (Signed)
DISCONTINUE OFF PATHWAY REGIMEN - Breast   [id number]:Atezolizumab 1,200 mg q21 Days:   A cycle is every 21 days:     Atezolizumab   **Always confirm dose/schedule in your pharmacy ordering system**  REASON: Disease Progression PRIOR TREATMENT: Off Pathway: Atezolizumab 1,200 mg q21 Days TREATMENT RESPONSE: Partial Response (PR)  START OFF PATHWAY REGIMEN - Breast   [id number]:Dose-Dense AC q14 days:   A cycle is every 14 days:     Doxorubicin      Cyclophosphamide      [id number]-[id number]   **Always confirm dose/schedule in your pharmacy ordering system**  Patient Characteristics: Distant Metastases or Locoregional Recurrent Disease - Unresected or Locally Advanced Unresectable Disease Progressing after Neoadjuvant and Local Therapies, HER2 Negative/Unknown/Equivocal, ER Negative/Unknown, Chemotherapy, Third Line and Beyond,  Anthracycline Candidate Therapeutic Status: Distant Metastases BRCA Mutation Status: Present ([place]) ER Status: Negative (-) HER2 Status: Negative (-) PR Status: Negative (-) Line of Therapy: Third Line and Beyond Intent of Therapy: Non-Curative / Palliative Intent, Discussed with Patient

## 2019-03-31 NOTE — Progress Notes (Signed)
Hematology and Oncology Follow Up Visit  Sophia [name] [id number] 03-04-1991 28 y.o. 03/31/2019   Principle Diagnosis:  Metastatic breast cancer- BRCA (+) - ER-/PR-/HER2-  Vertebral metastases on 05/17/2018 -- TMB -low; MSI - stable; PIK3CA - wt Iron deficiency anemia   Past Therapy: Taxotere/Carboplatin/Herceptin/Perjeta -s/p cycle 4 S/P Bilateral mastectomies - 10/09/2017 Ovarian ablation with Lupron - stopped 11/06/2017 S/p TAH-BSO on 01/19/2018 Radiation Therapy -- back and ribs - started on 07/21/2018 Radiation Therapy --Left pelvis- started on 01/17/2019 and completed 02/02/2019 Tecentriq/Avastin -- q 3 wk cycles --s/p cycle8- started01/29/2020 -- d/c on 03/24/2019 Lynparza (Olapirib) 300 mg po BID -- start 06/23/2018 -- d/c on 03/24/2019  Current Therapy:    IV Iron as indicated Zometa 4 mg IV q 3 month -- next dose10/2020   Interim History:  Sophia [name] is here today for follow-up. She had her liver biopsy Tuesday and is still a little sore in the right side. We are waiting her prognostic report to determine whether she is HER2 negative or positive. This will dictate how we proceed with treatment.  She states that the 25 mcg duragesic patch and dilaudid 4 mg every 4 hours as needed for pain have helped with her pain control.  Her continued weight loss is concerning. She is down a total of 15 lbs in 6 weeks with 6 lbs lost in the last 2 weeks.  She did not like Marinol and was not able to get Megace from her local pharmacy. I asked if we could switch the Megace prescription to our pharmacy downstairs but she states that she feels that her appetite is improving and wants to hold off for now.  She states that she is hydrating well and does not need fluids today.   She has had increased constipation and bloating with the pain medication. No fever, chills, n/v, cough, rash, dizziness, SOB, chest pain, palpitations or changes in bladder habits.  No episodes of  bleeding. No bruising or petechiae.  No swelling, tenderness, numbness or tingling in her extremities.   ECOG Performance Status: 2 - Symptomatic, <50% confined to bed  Medications:  Allergies as of 03/31/2019   No Known Allergies     Medication List       Accurate as of March 31, 2019  9:24 AM. If you have any questions, ask your nurse or doctor.        fentaNYL 25 MCG/HR Commonly known as: Brave 1 patch onto the skin every 3 (three) days.   HYDROmorphone 2 MG tablet Commonly known as: Dilaudid Take 1 tablet (2 mg total) by mouth every 4 (four) hours as needed for up to 7 days for severe pain.   megestrol 625 MG/5ML suspension Commonly known as: MEGACE ES Take 5 mLs (625 mg total) by mouth daily.   metoCLOPramide 10 MG tablet Commonly known as: REGLAN Take 2 tablets (20 mg total) by mouth 3 (three) times daily before meals.   olaparib 150 MG tablet Commonly known as: LYNPARZA Take 2 tablets (300 mg total) by mouth 2 (two) times daily. Swallow whole.   ondansetron 8 MG disintegrating tablet Commonly known as: ZOFRAN-ODT Take 1 tablet (8 mg total) by mouth every 6 (six) hours as needed for nausea or vomiting.   Tecentriq 1200 MG/20ML Generic drug: atezolizumab Inject 20 mLs (1,200 mg total) into the vein every 21 ( twenty-one) days. Admix with 250 cc Normal Saline.  May administer with or without  .2 micron in line filter.  Not compatible with  D5W   traMADol 50 MG tablet Commonly known as: ULTRAM Take 1 tablet (50 mg total) by mouth every 6 (six) hours as needed.       Allergies: No Known Allergies  Past Medical History, Surgical history, Social history, and Family History were reviewed and updated.  Review of Systems: All other 10 point review of systems is negative.   Physical Exam:  weight is 146 lb (66.2 kg). Her temporal temperature is 97 F (36.1 C) (abnormal). Her blood pressure is 103/74 and her pulse is 99. Her respiration is 18 and  oxygen saturation is 96%.   Wt Readings from Last 3 Encounters:  03/31/19 146 lb (66.2 kg)  03/29/19 145 lb (65.8 kg)  03/10/19 151 lb (68.5 kg)    Ocular: Sclerae unicteric, pupils equal, round and reactive to light Ear-nose-throat: Oropharynx clear, dentition fair Lymphatic: No cervical or supraclavicular adenopathy Lungs no rales or rhonchi, good excursion bilaterally Heart regular rate and rhythm, no murmur appreciated Abd soft, mildly tender in right upper quadrant, positive bowel sounds, no liver or spleen tip palpated on exam, no fluid wave  MSK no focal spinal tenderness, no joint edema Neuro: non-focal, well-oriented, appropriate affect Breasts: Deferred   Lab Results  Component Value Date   WBC 4.6 03/29/2019   HGB 13.1 03/29/2019   HCT 42.3 03/29/2019   MCV 92.0 03/29/2019   PLT 121 (L) 03/29/2019   Lab Results  Component Value Date   FERRITIN 332 (H) 01/20/2019   IRON 59 01/20/2019   TIBC 235 (L) 01/20/2019   UIBC 176 01/20/2019   IRONPCTSAT 25 01/20/2019   Lab Results  Component Value Date   RBC 4.60 03/29/2019   No results found for: KPAFRELGTCHN, LAMBDASER, KAPLAMBRATIO No results found for: IGGSERUM, IGA, IGMSERUM No results found for: TOTALPROTELP, ALBUMINELP, A1GS, A2GS, Arnaldo Natal, GAMS, MSPIKE, SPEI   Chemistry      Component Value Date/Time   NA 133 (L) 03/10/2019 1120   K 3.8 03/10/2019 1120   CL 96 (L) 03/10/2019 1120   CO2 24 03/10/2019 1120   BUN 15 03/10/2019 1120   CREATININE 0.62 03/10/2019 1120      Component Value Date/Time   CALCIUM 10.2 03/10/2019 1120   ALKPHOS 179 (H) 03/10/2019 1120   AST 58 (H) 03/10/2019 1120   ALT 32 03/10/2019 1120   BILITOT 0.7 03/10/2019 1120       Impression and Plan: Sophia [name] is a very pleasant 28 yo caucasian female with metastatic breast cancer HER-2- and ER negative, BRCA positive.  CA 27.29 last month was 18.8.  She had her liver biopsy Wednesday and we are awaiting to see if she is  HER-2 positive or negative.  I spoke with Dr. Marin Olp and we will go ahead and start her on treatment with CAF (Cytoxan, Adriamycin and 5-FU) next week on Monday. This was decided after the patient had left for the day and I left a voicemail with call back number to go over the new treatment plan. If I do not hear from her today I will try again tomorrow.  After cycle 1, we will get an ECHO.  Prescriptions for Lactulose and Dilaudid refill sent downstairs per her request.  No fluids needed this visit per patient.  She will increase her calorie and protein intake.  She will contact our office with any questions or concerns. We can certainly see her sooner if needed.    Laverna Peace, NP 11/5/20209:24 AM

## 2019-04-01 ENCOUNTER — Telehealth: Payer: Self-pay | Admitting: Family

## 2019-04-01 ENCOUNTER — Encounter: Payer: Self-pay | Admitting: Hematology & Oncology

## 2019-04-01 LAB — CANCER ANTIGEN 27.29: CA 27.29: 21.9 U/mL (ref 0.0–38.6)

## 2019-04-01 LAB — SURGICAL PATHOLOGY

## 2019-04-01 NOTE — Telephone Encounter (Signed)
I was able to speak with [facility] and go over her new chemo regimen that she will start next week. We discussed potential side effects (fatigue, hair loss, pancytopenia, etc.) in detail. She is disappointed to have to restart chemo but willing to move forward. All questions at this time answered. She will contact our office with any new questions or concerns. Kenney Houseman will call later today to schedule.

## 2019-04-04 ENCOUNTER — Other Ambulatory Visit: Payer: Medicaid Other

## 2019-04-04 ENCOUNTER — Ambulatory Visit: Payer: Medicaid Other | Admitting: Hematology & Oncology

## 2019-04-04 ENCOUNTER — Ambulatory Visit: Payer: Medicaid Other

## 2019-04-06 ENCOUNTER — Other Ambulatory Visit: Payer: Self-pay

## 2019-04-06 ENCOUNTER — Ambulatory Visit: Payer: Medicaid Other

## 2019-04-06 ENCOUNTER — Inpatient Hospital Stay: Payer: Medicaid Other

## 2019-04-06 ENCOUNTER — Encounter: Payer: Self-pay | Admitting: Hematology & Oncology

## 2019-04-06 ENCOUNTER — Encounter: Payer: Self-pay | Admitting: *Deleted

## 2019-04-06 ENCOUNTER — Inpatient Hospital Stay (HOSPITAL_BASED_OUTPATIENT_CLINIC_OR_DEPARTMENT_OTHER): Payer: Medicaid Other | Admitting: Hematology & Oncology

## 2019-04-06 VITALS — BP 130/70 | HR 104 | Temp 97.5°F | Resp 20 | Wt 142.0 lb

## 2019-04-06 DIAGNOSIS — C50919 Malignant neoplasm of unspecified site of unspecified female breast: Secondary | ICD-10-CM

## 2019-04-06 DIAGNOSIS — C7951 Secondary malignant neoplasm of bone: Secondary | ICD-10-CM | POA: Diagnosis not present

## 2019-04-06 DIAGNOSIS — Z171 Estrogen receptor negative status [ER-]: Secondary | ICD-10-CM

## 2019-04-06 DIAGNOSIS — C50111 Malignant neoplasm of central portion of right female breast: Secondary | ICD-10-CM

## 2019-04-06 DIAGNOSIS — Z5112 Encounter for antineoplastic immunotherapy: Secondary | ICD-10-CM | POA: Diagnosis not present

## 2019-04-06 DIAGNOSIS — D500 Iron deficiency anemia secondary to blood loss (chronic): Secondary | ICD-10-CM

## 2019-04-06 LAB — CBC WITH DIFFERENTIAL (CANCER CENTER ONLY)
Abs Immature Granulocytes: 0.01 10*3/uL (ref 0.00–0.07)
Basophils Absolute: 0 10*3/uL (ref 0.0–0.1)
Basophils Relative: 1 %
Eosinophils Absolute: 0.2 10*3/uL (ref 0.0–0.5)
Eosinophils Relative: 6 %
HCT: 35.2 % — ABNORMAL LOW (ref 36.0–46.0)
Hemoglobin: 11.3 g/dL — ABNORMAL LOW (ref 12.0–15.0)
Immature Granulocytes: 0 %
Lymphocytes Relative: 13 %
Lymphs Abs: 0.5 10*3/uL — ABNORMAL LOW (ref 0.7–4.0)
MCH: 28.6 pg (ref 26.0–34.0)
MCHC: 32.1 g/dL (ref 30.0–36.0)
MCV: 89.1 fL (ref 80.0–100.0)
Monocytes Absolute: 0.5 10*3/uL (ref 0.1–1.0)
Monocytes Relative: 13 %
Neutro Abs: 2.4 10*3/uL (ref 1.7–7.7)
Neutrophils Relative %: 67 %
Platelet Count: 109 10*3/uL — ABNORMAL LOW (ref 150–400)
RBC: 3.95 MIL/uL (ref 3.87–5.11)
RDW: 14.5 % (ref 11.5–15.5)
WBC Count: 3.6 10*3/uL — ABNORMAL LOW (ref 4.0–10.5)
nRBC: 0 % (ref 0.0–0.2)

## 2019-04-06 LAB — CMP (CANCER CENTER ONLY)
ALT: 17 U/L (ref 0–44)
AST: 61 U/L — ABNORMAL HIGH (ref 15–41)
Albumin: 3.8 g/dL (ref 3.5–5.0)
Alkaline Phosphatase: 172 U/L — ABNORMAL HIGH (ref 38–126)
Anion gap: 10 (ref 5–15)
BUN: 9 mg/dL (ref 6–20)
CO2: 28 mmol/L (ref 22–32)
Calcium: 9.7 mg/dL (ref 8.9–10.3)
Chloride: 97 mmol/L — ABNORMAL LOW (ref 98–111)
Creatinine: 0.47 mg/dL (ref 0.44–1.00)
GFR, Est AFR Am: >60 mL/min (ref >60)
GFR, Estimated: >60 mL/min (ref >60)
Glucose, Bld: 104 mg/dL — ABNORMAL HIGH (ref 70–99)
Potassium: 3.6 mmol/L (ref 3.5–5.1)
Sodium: 135 mmol/L (ref 135–145)
Total Bilirubin: 0.8 mg/dL (ref 0.3–1.2)
Total Protein: 7.1 g/dL (ref 6.5–8.1)

## 2019-04-06 MED ORDER — SODIUM CHLORIDE 0.9 % IV SOLN
40.0000 mg | Freq: Once | INTRAVENOUS | Status: AC
  Administered 2019-04-06: 14:00:00 40 mg via INTRAVENOUS
  Filled 2019-04-06: qty 4

## 2019-04-06 MED ORDER — HEPARIN SOD (PORK) LOCK FLUSH 100 UNIT/ML IV SOLN
500.0000 [IU] | Freq: Once | INTRAVENOUS | Status: AC | PRN
  Administered 2019-04-06: 500 [IU]
  Filled 2019-04-06: qty 5

## 2019-04-06 MED ORDER — SODIUM CHLORIDE 0.9 % IV SOLN
Freq: Once | INTRAVENOUS | Status: AC
  Administered 2019-04-06: 13:00:00 via INTRAVENOUS
  Filled 2019-04-06: qty 250

## 2019-04-06 MED ORDER — SODIUM CHLORIDE 0.9% FLUSH
10.0000 mL | INTRAVENOUS | Status: DC | PRN
  Administered 2019-04-06: 15:00:00 10 mL
  Filled 2019-04-06: qty 10

## 2019-04-06 MED ORDER — FENTANYL 25 MCG/HR TD PT72: 1.0000 | MEDICATED_PATCH | TRANSDERMAL | 0 refills | Status: AC

## 2019-04-06 MED ORDER — FENTANYL 25 MCG/HR TD PT72: 1.0000 | MEDICATED_PATCH | TRANSDERMAL | 0 refills | Status: DC

## 2019-04-06 MED ORDER — MEGESTROL ACETATE 625 MG/5ML PO SUSP: 625.0000 mg | Freq: Every day | ORAL | 4 refills | Status: AC

## 2019-04-06 MED FILL — fentaNYL 25 MCG/HR PT72: 25 | 30 days supply | Qty: 10 | Fill #0

## 2019-04-06 NOTE — Patient Instructions (Signed)
Dehydration, Adult  Dehydration is when there is not enough fluid or water in your body. This happens when you lose more fluids than you take in. Dehydration can range from mild to very bad. It should be treated right away to keep it from getting very bad. Symptoms of mild dehydration may include:  Thirst.  Dry lips.  Slightly dry mouth.  Dry, warm skin.  Dizziness. Symptoms of moderate dehydration may include:  Very dry mouth.  Muscle cramps.  Dark pee (urine). Pee may be the color of tea.  Your body making less pee.  Your eyes making fewer tears.  Heartbeat that is uneven or faster than normal (palpitations).  Headache.  Light-headedness, especially when you stand up from sitting.  Fainting (syncope). Symptoms of very bad dehydration may include:  Changes in skin, such as: ? Cold and clammy skin. ? Blotchy (mottled) or pale skin. ? Skin that does not quickly return to normal after being lightly pinched and let go (poor skin turgor).  Changes in body fluids, such as: ? Feeling very thirsty. ? Your eyes making fewer tears. ? Not sweating when body temperature is high, such as in hot weather. ? Your body making very little pee.  Changes in vital signs, such as: ? Weak pulse. ? Pulse that is more than 100 beats a minute when you are sitting still. ? Fast breathing. ? Low blood pressure.  Other changes, such as: ? Sunken eyes. ? Cold hands and feet. ? Confusion. ? Lack of energy (lethargy). ? Trouble waking up from sleep. ? Short-term weight loss. ? Unconsciousness. Follow these instructions at home:   If told by your doctor, drink an ORS: ? Make an ORS by using instructions on the package. ? Start by drinking small amounts, about  cup (120 mL) every 5-10 minutes. ? Slowly drink more until you have had the amount that your doctor said to have.  Drink enough clear fluid to keep your pee clear or pale yellow. If you were told to drink an ORS, finish the  ORS first, then start slowly drinking clear fluids. Drink fluids such as: ? Water. Do not drink only water by itself. Doing that can make the salt (sodium) level in your body get too low (hyponatremia). ? Ice chips. ? Fruit juice that you have added water to (diluted). ? Low-calorie sports drinks.  Avoid: ? Alcohol. ? Drinks that have a lot of sugar. These include high-calorie sports drinks, fruit juice that does not have water added, and soda. ? Caffeine. ? Foods that are greasy or have a lot of fat or sugar.  Take over-the-counter and prescription medicines only as told by your doctor.  Do not take salt tablets. Doing that can make the salt level in your body get too high (hypernatremia).  Eat foods that have minerals (electrolytes). Examples include bananas, oranges, potatoes, tomatoes, and spinach.  Keep all follow-up visits as told by your doctor. This is important. Contact a doctor if:  You have belly (abdominal) pain that: ? Gets worse. ? Stays in one area (localizes).  You have a rash.  You have a stiff neck.  You get angry or annoyed more easily than normal (irritability).  You are more sleepy than normal.  You have a harder time waking up than normal.  You feel: ? Weak. ? Dizzy. ? Very thirsty.  You have peed (urinated) only a small amount of very dark pee during 6-8 hours. Get help right away if:  You have   symptoms of very bad dehydration.  You cannot drink fluids without throwing up (vomiting).  Your symptoms get worse with treatment.  You have a fever.  You have a very bad headache.  You are throwing up or having watery poop (diarrhea) and it: ? Gets worse. ? Does not go away.  You have blood or something green (bile) in your throw-up.  You have blood in your poop (stool). This may cause poop to look black and tarry.  You have not peed in 6-8 hours.  You pass out (faint).  Your heart rate when you are sitting still is more than 100 beats a  minute.  You have trouble breathing. This information is not intended to replace advice given to you by your health care provider. Make sure you discuss any questions you have with your health care provider. Document Released: 03/08/2009 Document Revised: 04/24/2017 Document Reviewed: 07/06/2015 Elsevier Patient Education  2020 Elsevier Inc.  

## 2019-04-06 NOTE — Progress Notes (Addendum)
Paradigm sent on liver biopsy per order of Dr. Marin Olp.  Call placed to [facility] at [employer] to notify him that pt had Paradigm sent in February of 2020 which was run on [place] and to not repeat Paradigm if patient will be charged.

## 2019-04-06 NOTE — Progress Notes (Signed)
Hematology and Oncology Follow Up Visit  Sophia [name] [id number] 1991/04/02 28 y.o. 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 on 05/17/2018 -- TMB -low; MSI - stable; PIK3CA - wt Iron deficiency anemia   Past Therapy: Taxotere/Carboplatin/Herceptin/Perjeta -s/p cycle 4 S/P Bilateral mastectomies - 10/09/2017 Ovarian ablation with Lupron - stopped 11/06/2017 S/p TAH-BSO on 01/19/2018 Radiation Therapy -- back and ribs - started on 07/21/2018 Radiation Therapy --  Left pelvis - started on 01/17/2019 and completed 02/02/2019  Current Therapy:   Tecentriq/Avastin -- q 3 wk cycles --s/p cycle 8 - started 06/23/2018 -- d/c on 03/24/2019 Lynparza (Olapirib) 300 mg po BID -- start 06/23/2018 IV Iron as indicated -- d/c on 03/24/2019 Zometa 4 mg IV q 3 month -- next dose10/2020   Interim History:  Sophia [name] is here today for follow-up.  It is clear that Sophia [name] is progressing.  Sophia [name] had the PET scan done a couple weeks ago which confirmed that Sophia [name] had progressive disease.  Sophia [name] now has disease in Sophia [name] liver.  There is more adenopathy that is involved.  We did go ahead and get a liver biopsy on Sophia [name].  Sophia [name] really needed to see if Sophia [name] tumor was Sophia [name]-2 positive or not.  By the aggressiveness of this cancer, Sophia [name] would think that Sophia [name] tumor would be triple negative..  Sophia [name] had the biopsy done on 03/29/2019.  The pathology report ([id number]) showed metastatic carcinoma.  This is consistent with Sophia [name] breast cancer.  So far, the tumor is ER negative and PR negative.  Sophia [name] is BRCA positive.  However, Sophia [name] had been [place] and progressed [place]  Sophia [name] spoke to Sophia [name] and Sophia [name] boyfriend.  Sophia [name] spent about 45 minutes with them.  Sophia [name] really think that we have to try to get a quick response.  Sophia [name] think that chemotherapy would be the best way to get a quick response.  Sophia [name] has never had Adriamycin.  Sophia [name] would consider dose dense Adriamycin/Cytoxan  because Sophia [name] is young and still has a decent performance status.  Sophia [name] is reluctant to take chemotherapy.  Sophia [name] am not sure exactly what the reluctance is outside of the fact that Sophia [name] just does not want to try to lose Sophia [name] hair.  Sophia [name] understand this given that Sophia [name] is only 28 years old.  If Sophia [name] is Sophia [name]-2 positive, then we could consider utilizing EnHertu or the combination of Herceptin/Xeloda/tucatinib.  Again, Sophia [name] still think that we should try to use chemotherapy to get a a quick response.  Again, Sophia [name] think dose dense Adriamycin/Cytoxan would be a very good idea for Sophia [name]..  Sophia [name] had an echocardiogram done a week or so ago which showed very good cardiac function.    If Sophia [name] is triple negative, one possibility that we might be able to try is sacituzumab govitecan.  Again Sophia [name] am unsure if we could use this given that Sophia [name] has not had really prior chemotherapy outside of chemotherapy in the adjuvant setting.  Sophia [name] does have some dehydration going on right now.  Sophia [name] will need some IV fluids..  Sophia [name] is not eating.  Sophia [name] am sure Sophia [name] hepatomegaly and liver tumor is probably the cause of this..  Sophia [name] has not gotten the Megace yet.  Sophia [name] will not take Marinol.  Sophia [name] is not hurting right now.  Sophia [name] is on fentanyl and Dilaudid.  The combination of the 2 seems to help Sophia [name].  Sophia [name] am going to try Sophia [name] on some  IV dexamethasone to see if this might help a little bit.  Sophia [name] is constipated.  Sophia [name] told Sophia [name] that Sophia [name] could take lactulose up to 4 times a day.  Overall, Sophia [name] performance status is ECOG 1.     Medications:  Allergies as of 04/06/2019   No Known Allergies     Medication List       Accurate as of April 06, 2019  1:31 PM. If you have any questions, ask your nurse or doctor.        fentaNYL 25 MCG/HR Commonly known as: [place] 1 patch onto the skin every 3 (three) days.   HYDROmorphone 2 MG tablet Commonly known as: Dilaudid Take 1 tablet (2 mg total) by mouth every 4 (four) hours as needed for up to 7 days for  severe pain.   Lactulose 20 GM/30ML Soln Take 15 mLs (10 g total) by mouth daily as needed.   megestrol 625 MG/5ML suspension Commonly known as: MEGACE ES Take 5 mLs (625 mg total) by mouth daily.   metoCLOPramide 10 MG tablet Commonly known as: REGLAN Take 2 tablets (20 mg total) by mouth 3 (three) times daily before meals.   olaparib 150 MG tablet Commonly known as: LYNPARZA Take 2 tablets (300 mg total) by mouth 2 (two) times daily. Swallow whole.   ondansetron 8 MG disintegrating tablet Commonly known as: ZOFRAN-ODT Take 1 tablet (8 mg total) by mouth every 6 (six) hours as needed for nausea or vomiting.   Tecentriq 1200 MG/20ML Generic drug: atezolizumab Inject 20 mLs (1,200 mg total) into the vein every 21 ( twenty-one) days. Admix with 250 cc Normal Saline.  May administer with or without  .2 micron in line filter.  Not compatible with D5W   traMADol 50 MG tablet Commonly known as: ULTRAM Take 1 tablet (50 mg total) by mouth every 6 (six) hours as needed.       Allergies: No Known Allergies  Past Medical History, Surgical history, Social history, and Family History were reviewed and updated.  Review of Systems: Review of Systems  Constitutional: Negative.   HENT: Negative.   Eyes: Negative.   Respiratory: Negative.   Cardiovascular: Negative.   Gastrointestinal: Negative.   Genitourinary: Negative.   Musculoskeletal: Negative.   Skin: Negative.   Neurological: Negative.   Endo/Heme/Allergies: Negative.   Psychiatric/Behavioral: Negative.      Physical Exam:  weight is 142 lb (64.4 kg). Sophia [name] temporal temperature is 97.5 F (36.4 C) (abnormal). Sophia [name] blood pressure is 130/70 and Sophia [name] pulse is 104 (abnormal). Sophia [name] respiration is 20 and oxygen saturation is 100%.   Wt Readings from Last 3 Encounters:  04/06/19 142 lb (64.4 kg)  04/06/19 142 lb (64.4 kg)  03/31/19 146 lb (66.2 kg)    Physical Exam Vitals signs reviewed.  HENT:     Head: Normocephalic and  atraumatic.  Eyes:     Pupils: Pupils are equal, round, and reactive to light.  Neck:     Musculoskeletal: Normal range of motion.  Cardiovascular:     Rate and Rhythm: Normal rate and regular rhythm.     Heart sounds: Normal heart sounds.  Pulmonary:     Effort: Pulmonary effort is normal.     Breath sounds: Normal breath sounds.  Abdominal:     General: Bowel sounds are normal.     Palpations: Abdomen is soft.  Musculoskeletal: Normal range of motion.        General: No tenderness or deformity.  Lymphadenopathy:  Cervical: No cervical adenopathy.  Skin:    General: Skin is warm and dry.     Findings: No erythema or rash.  Neurological:     Mental Status: Sophia [name] is alert and oriented to person, place, and time.  Psychiatric:        Behavior: Behavior normal.        Thought Content: Thought content normal.        Judgment: Judgment normal.      Lab Results  Component Value Date   WBC 3.6 (L) 04/06/2019   HGB 11.3 (L) 04/06/2019   HCT 35.2 (L) 04/06/2019   MCV 89.1 04/06/2019   PLT 109 (L) 04/06/2019   Lab Results  Component Value Date   FERRITIN 332 (H) 01/20/2019   IRON 59 01/20/2019   TIBC 235 (L) 01/20/2019   UIBC 176 01/20/2019   IRONPCTSAT 25 01/20/2019   Lab Results  Component Value Date   RBC 3.95 04/06/2019   No results found for: KPAFRELGTCHN, LAMBDASER, KAPLAMBRATIO No results found for: IGGSERUM, IGA, IGMSERUM No results found for: Odetta Pink, SPEI   Chemistry      Component Value Date/Time   NA 135 04/06/2019 1142   K 3.6 04/06/2019 1142   CL 97 (L) 04/06/2019 1142   CO2 28 04/06/2019 1142   BUN 9 04/06/2019 1142   CREATININE 0.47 04/06/2019 1142      Component Value Date/Time   CALCIUM 9.7 04/06/2019 1142   ALKPHOS 172 (H) 04/06/2019 1142   AST 61 (H) 04/06/2019 1142   ALT 17 04/06/2019 1142   BILITOT 0.8 04/06/2019 1142       Impression and Plan: Ms. Pall is a very  pleasant 28 yo caucasian female with metastatic breast cancer Sophia [name]-2- and ER negative. Sophia [name] is BRCA positive.   For right now, we are still stuck in a "holding pattern."  We need to see what the Sophia [name]-2 status is.  Sophia [name] told [name] that if Sophia [name] is Sophia [name]-2 positive, Sophia [name] would still consider chemotherapy to try to get a quick response and then going to anti-Sophia [name]-2 therapy.  Sophia [name] does have 2 young children.  Sophia [name] talked to Sophia [name] about this.  Sophia [name] think that [facility] would be a good idea for them.  They could help talk to Sophia [name] children to get them to understand better what their mother is going through.  Hopefully, we will have the results of the Sophia [name]-2 study this week.  Sophia [name] would then talk to Sophia [name] again.  Sophia [name] just want to get something started so that we can try to help decrease Sophia [name] tumor burden, particularly that which is in the liver.    Volanda Napoleon, MD 11/11/20201:31 PM

## 2019-04-08 ENCOUNTER — Inpatient Hospital Stay: Payer: Medicaid Other

## 2019-04-08 MED FILL — MEGESTROL 625 MG/5 ML SUSP: 625 | 30 days supply | Qty: 150 | Fill #0

## 2019-04-12 ENCOUNTER — Other Ambulatory Visit: Payer: Self-pay

## 2019-04-12 ENCOUNTER — Inpatient Hospital Stay: Payer: Medicaid Other

## 2019-04-12 ENCOUNTER — Other Ambulatory Visit: Payer: Self-pay | Admitting: Hematology & Oncology

## 2019-04-12 ENCOUNTER — Encounter: Payer: Self-pay | Admitting: Hematology & Oncology

## 2019-04-12 VITALS — BP 102/80 | HR 98 | Temp 97.7°F

## 2019-04-12 DIAGNOSIS — C7951 Secondary malignant neoplasm of bone: Secondary | ICD-10-CM

## 2019-04-12 DIAGNOSIS — D500 Iron deficiency anemia secondary to blood loss (chronic): Secondary | ICD-10-CM

## 2019-04-12 DIAGNOSIS — C50111 Malignant neoplasm of central portion of right female breast: Secondary | ICD-10-CM

## 2019-04-12 DIAGNOSIS — C50919 Malignant neoplasm of unspecified site of unspecified female breast: Secondary | ICD-10-CM

## 2019-04-12 DIAGNOSIS — Z5112 Encounter for antineoplastic immunotherapy: Secondary | ICD-10-CM | POA: Diagnosis not present

## 2019-04-12 DIAGNOSIS — Z171 Estrogen receptor negative status [ER-]: Secondary | ICD-10-CM

## 2019-04-12 MED ORDER — SODIUM CHLORIDE 0.9 % IV SOLN
Freq: Once | INTRAVENOUS | Status: AC
  Administered 2019-04-12: 13:00:00 via INTRAVENOUS
  Filled 2019-04-12: qty 250

## 2019-04-12 MED ORDER — SODIUM CHLORIDE 0.9 % IV SOLN
40.0000 mg | Freq: Once | INTRAVENOUS | Status: AC
  Administered 2019-04-12: 40 mg via INTRAVENOUS
  Filled 2019-04-12: qty 4

## 2019-04-12 MED ORDER — HEPARIN SOD (PORK) LOCK FLUSH 100 UNIT/ML IV SOLN
500.0000 [IU] | Freq: Once | INTRAVENOUS | Status: AC
  Administered 2019-04-12: 15:00:00 500 [IU] via INTRAVENOUS
  Filled 2019-04-12: qty 5

## 2019-04-12 MED ORDER — SODIUM CHLORIDE 0.9% FLUSH
10.0000 mL | INTRAVENOUS | Status: DC | PRN
  Administered 2019-04-12: 10 mL via INTRAVENOUS
  Filled 2019-04-12: qty 10

## 2019-04-14 ENCOUNTER — Other Ambulatory Visit: Payer: Self-pay | Admitting: *Deleted

## 2019-04-14 ENCOUNTER — Inpatient Hospital Stay: Payer: Medicaid Other

## 2019-04-14 ENCOUNTER — Other Ambulatory Visit: Payer: Self-pay

## 2019-04-14 VITALS — BP 105/72 | HR 72 | Temp 97.7°F | Resp 18

## 2019-04-14 DIAGNOSIS — C50111 Malignant neoplasm of central portion of right female breast: Secondary | ICD-10-CM

## 2019-04-14 DIAGNOSIS — C50919 Malignant neoplasm of unspecified site of unspecified female breast: Secondary | ICD-10-CM

## 2019-04-14 DIAGNOSIS — Z171 Estrogen receptor negative status [ER-]: Secondary | ICD-10-CM

## 2019-04-14 DIAGNOSIS — Z5112 Encounter for antineoplastic immunotherapy: Secondary | ICD-10-CM | POA: Diagnosis not present

## 2019-04-14 MED ORDER — PALONOSETRON HCL INJECTION 0.25 MG/5ML
INTRAVENOUS | Status: AC
  Filled 2019-04-14: qty 5

## 2019-04-14 MED ORDER — PROCHLORPERAZINE MALEATE 10 MG PO TABS: 10.0000 mg | ORAL_TABLET | Freq: Four times a day (QID) | ORAL | 1 refills | Status: DC | PRN

## 2019-04-14 MED ORDER — PROCHLORPERAZINE MALEATE 10 MG PO TABS: 10.0000 mg | ORAL_TABLET | Freq: Four times a day (QID) | ORAL | 1 refills | Status: AC | PRN

## 2019-04-14 MED ORDER — LORAZEPAM 0.5 MG PO TABS: 0.5000 mg | ORAL_TABLET | Freq: Four times a day (QID) | ORAL | 0 refills | Status: AC | PRN

## 2019-04-14 MED ORDER — DEXAMETHASONE 4 MG PO TABS: ORAL_TABLET | ORAL | 1 refills | Status: DC

## 2019-04-14 MED ORDER — PALONOSETRON HCL INJECTION 0.25 MG/5ML
0.2500 mg | Freq: Once | INTRAVENOUS | Status: AC
  Administered 2019-04-14: 0.25 mg via INTRAVENOUS

## 2019-04-14 MED ORDER — ATROPINE SULFATE 1 MG/ML IJ SOLN
INTRAMUSCULAR | Status: AC
  Filled 2019-04-14: qty 1

## 2019-04-14 MED ORDER — LORAZEPAM 0.5 MG PO TABS: 0.5000 mg | ORAL_TABLET | Freq: Four times a day (QID) | ORAL | 0 refills | Status: DC | PRN

## 2019-04-14 MED ORDER — FAMOTIDINE IN NACL 20-0.9 MG/50ML-% IV SOLN
20.0000 mg | Freq: Once | INTRAVENOUS | Status: AC
  Administered 2019-04-14: 11:00:00 20 mg via INTRAVENOUS

## 2019-04-14 MED ORDER — ATROPINE SULFATE 1 MG/ML IJ SOLN: 0.5000 mg | Freq: Once | INTRAMUSCULAR | Status: DC | PRN

## 2019-04-14 MED ORDER — SODIUM CHLORIDE 0.9 % IV SOLN
Freq: Once | INTRAVENOUS | Status: AC
  Administered 2019-04-14: 11:00:00 via INTRAVENOUS
  Filled 2019-04-14: qty 250

## 2019-04-14 MED ORDER — SODIUM CHLORIDE 0.9% FLUSH
10.0000 mL | INTRAVENOUS | Status: DC | PRN
  Administered 2019-04-14: 10 mL
  Filled 2019-04-14: qty 10

## 2019-04-14 MED ORDER — LOPERAMIDE HCL 2 MG PO TABS: ORAL_TABLET | ORAL | 1 refills | Status: AC

## 2019-04-14 MED ORDER — DEXAMETHASONE 4 MG PO TABS: ORAL_TABLET | ORAL | 1 refills | Status: AC

## 2019-04-14 MED ORDER — SODIUM CHLORIDE 0.9 % IV SOLN
10.0000 mg/kg | Freq: Once | INTRAVENOUS | Status: AC
  Administered 2019-04-14: 640 mg via INTRAVENOUS
  Filled 2019-04-14: qty 64

## 2019-04-14 MED ORDER — SODIUM CHLORIDE 0.9 % IV SOLN
Freq: Once | INTRAVENOUS | Status: AC
  Administered 2019-04-14: 11:00:00 via INTRAVENOUS
  Filled 2019-04-14: qty 5

## 2019-04-14 MED ORDER — HEPARIN SOD (PORK) LOCK FLUSH 100 UNIT/ML IV SOLN
500.0000 [IU] | Freq: Once | INTRAVENOUS | Status: AC | PRN
  Administered 2019-04-14: 500 [IU]
  Filled 2019-04-14: qty 5

## 2019-04-14 MED ORDER — LOPERAMIDE HCL 2 MG PO TABS: ORAL_TABLET | ORAL | 1 refills | Status: DC

## 2019-04-14 MED ORDER — FAMOTIDINE IN NACL 20-0.9 MG/50ML-% IV SOLN
INTRAVENOUS | Status: AC
  Filled 2019-04-14: qty 50

## 2019-04-14 MED FILL — ANTI-DIARRHEAL 2 MG TABS: 2 | 22 days supply | Qty: 72 | Fill #0

## 2019-04-14 MED FILL — LORazepam 0.5 MG TABS: 0.5 | 7 days supply | Qty: 30 | Fill #0

## 2019-04-14 MED FILL — DEXAMETHASONE 4 MG TABLET: 4 | 15 days supply | Qty: 30 | Fill #0

## 2019-04-14 MED FILL — PROCHLORPERAZINE 10 MG TAB: 10 | 7 days supply | Qty: 30 | Fill #0

## 2019-04-14 NOTE — Progress Notes (Signed)
Pt. refuses to take Tylenol and Benadryl. Dr. Marin Olp aware and approved it.

## 2019-04-14 NOTE — Patient Instructions (Signed)
[facility] Discharge Instructions for Patients Receiving Chemotherapy  Today you received the following chemotherapy agents   To help prevent nausea and vomiting after your treatment, we encourage you to take your nausea medication   If you develop nausea and vomiting that is not controlled by your nausea medication, call the clinic.   BELOW ARE SYMPTOMS THAT SHOULD BE REPORTED IMMEDIATELY:  *FEVER GREATER THAN 100.5 F  *CHILLS WITH OR WITHOUT FEVER  NAUSEA AND VOMITING THAT IS NOT CONTROLLED WITH YOUR NAUSEA MEDICATION  *UNUSUAL SHORTNESS OF BREATH  *UNUSUAL BRUISING OR BLEEDING  TENDERNESS IN MOUTH AND THROAT WITH OR WITHOUT PRESENCE OF ULCERS  *URINARY PROBLEMS  *BOWEL PROBLEMS  UNUSUAL RASH Items with * indicate a potential emergency and should be followed up as soon as possible.  Feel free to call the clinic should you have any questions or concerns. The clinic phone number is [phone].  Please show the CHEMO ALERT CARD at check-in to the Emergency Department and triage nurse.   

## 2019-04-15 ENCOUNTER — Telehealth: Payer: Self-pay

## 2019-04-15 ENCOUNTER — Other Ambulatory Visit: Payer: Self-pay | Admitting: Hematology & Oncology

## 2019-04-15 NOTE — Telephone Encounter (Signed)
LVM for chemo follow up call back. Patient was instructed to call back on Monday if she has any questions or to use my chart and we will follow up on Monday with her if she needs anything.

## 2019-04-18 ENCOUNTER — Other Ambulatory Visit: Payer: Self-pay | Admitting: Pharmacist

## 2019-04-20 ENCOUNTER — Inpatient Hospital Stay: Payer: Medicaid Other

## 2019-04-20 ENCOUNTER — Ambulatory Visit: Payer: Medicaid Other

## 2019-04-20 ENCOUNTER — Other Ambulatory Visit: Payer: PRIVATE HEALTH INSURANCE

## 2019-04-20 ENCOUNTER — Ambulatory Visit: Payer: PRIVATE HEALTH INSURANCE

## 2019-04-20 ENCOUNTER — Other Ambulatory Visit: Payer: Self-pay

## 2019-04-20 ENCOUNTER — Ambulatory Visit: Payer: PRIVATE HEALTH INSURANCE | Admitting: Hematology & Oncology

## 2019-04-20 VITALS — BP 106/72 | HR 70 | Temp 98.0°F | Resp 18

## 2019-04-20 DIAGNOSIS — Z171 Estrogen receptor negative status [ER-]: Secondary | ICD-10-CM

## 2019-04-20 DIAGNOSIS — C50919 Malignant neoplasm of unspecified site of unspecified female breast: Secondary | ICD-10-CM

## 2019-04-20 DIAGNOSIS — Z5112 Encounter for antineoplastic immunotherapy: Secondary | ICD-10-CM | POA: Diagnosis not present

## 2019-04-20 DIAGNOSIS — C50111 Malignant neoplasm of central portion of right female breast: Secondary | ICD-10-CM

## 2019-04-20 LAB — CBC WITH DIFFERENTIAL (CANCER CENTER ONLY)
Abs Immature Granulocytes: 0.04 10*3/uL (ref 0.00–0.07)
Basophils Absolute: 0 10*3/uL (ref 0.0–0.1)
Basophils Relative: 0 %
Eosinophils Absolute: 0.2 10*3/uL (ref 0.0–0.5)
Eosinophils Relative: 8 %
HCT: 39.3 % (ref 36.0–46.0)
Hemoglobin: 13 g/dL (ref 12.0–15.0)
Immature Granulocytes: 1 %
Lymphocytes Relative: 29 %
Lymphs Abs: 0.8 10*3/uL (ref 0.7–4.0)
MCH: 28.3 pg (ref 26.0–34.0)
MCHC: 33.1 g/dL (ref 30.0–36.0)
MCV: 85.6 fL (ref 80.0–100.0)
Monocytes Absolute: 0.2 10*3/uL (ref 0.1–1.0)
Monocytes Relative: 5 %
Neutro Abs: 1.7 10*3/uL (ref 1.7–7.7)
Neutrophils Relative %: 57 %
Platelet Count: 65 10*3/uL — ABNORMAL LOW (ref 150–400)
RBC: 4.59 MIL/uL (ref 3.87–5.11)
RDW: 14.9 % (ref 11.5–15.5)
WBC Count: 2.9 10*3/uL — ABNORMAL LOW (ref 4.0–10.5)
nRBC: 0 % (ref 0.0–0.2)

## 2019-04-20 LAB — CMP (CANCER CENTER ONLY)
ALT: 32 U/L (ref 0–44)
AST: 43 U/L — ABNORMAL HIGH (ref 15–41)
Albumin: 3.9 g/dL (ref 3.5–5.0)
Alkaline Phosphatase: 222 U/L — ABNORMAL HIGH (ref 38–126)
Anion gap: 10 (ref 5–15)
BUN: 23 mg/dL — ABNORMAL HIGH (ref 6–20)
CO2: 25 mmol/L (ref 22–32)
Calcium: 9.3 mg/dL (ref 8.9–10.3)
Chloride: 95 mmol/L — ABNORMAL LOW (ref 98–111)
Creatinine: 0.52 mg/dL (ref 0.44–1.00)
GFR, Est AFR Am: >60 mL/min (ref >60)
GFR, Estimated: >60 mL/min (ref >60)
Glucose, Bld: 115 mg/dL — ABNORMAL HIGH (ref 70–99)
Potassium: 3.6 mmol/L (ref 3.5–5.1)
Sodium: 130 mmol/L — ABNORMAL LOW (ref 135–145)
Total Bilirubin: 0.8 mg/dL (ref 0.3–1.2)
Total Protein: 7.4 g/dL (ref 6.5–8.1)

## 2019-04-20 LAB — LACTATE DEHYDROGENASE: LDH: 876 U/L — ABNORMAL HIGH (ref 98–192)

## 2019-04-20 MED ORDER — FAMOTIDINE IN NACL 20-0.9 MG/50ML-% IV SOLN
INTRAVENOUS | Status: AC
  Filled 2019-04-20: qty 50

## 2019-04-20 MED ORDER — DIPHENHYDRAMINE HCL 50 MG/ML IJ SOLN
INTRAMUSCULAR | Status: AC
  Filled 2019-04-20: qty 1

## 2019-04-20 MED ORDER — DIPHENHYDRAMINE HCL 50 MG/ML IJ SOLN
50.0000 mg | Freq: Once | INTRAMUSCULAR | Status: AC
  Administered 2019-04-20: 50 mg via INTRAVENOUS

## 2019-04-20 MED ORDER — SODIUM CHLORIDE 0.9 % IV SOLN
Freq: Once | INTRAVENOUS | Status: AC
  Administered 2019-04-20: 12:00:00 via INTRAVENOUS
  Filled 2019-04-20: qty 5

## 2019-04-20 MED ORDER — ACETAMINOPHEN 325 MG PO TABS
ORAL_TABLET | ORAL | Status: AC
  Filled 2019-04-20: qty 2

## 2019-04-20 MED ORDER — PALONOSETRON HCL INJECTION 0.25 MG/5ML
0.2500 mg | Freq: Once | INTRAVENOUS | Status: AC
  Administered 2019-04-20: 0.25 mg via INTRAVENOUS

## 2019-04-20 MED ORDER — FAMOTIDINE IN NACL 20-0.9 MG/50ML-% IV SOLN
20.0000 mg | Freq: Once | INTRAVENOUS | Status: AC
  Administered 2019-04-20: 20 mg via INTRAVENOUS

## 2019-04-20 MED ORDER — SODIUM CHLORIDE 0.9 % IV SOLN
Freq: Once | INTRAVENOUS | Status: AC
  Administered 2019-04-20: 11:00:00 via INTRAVENOUS
  Filled 2019-04-20: qty 250

## 2019-04-20 MED ORDER — HEPARIN SOD (PORK) LOCK FLUSH 100 UNIT/ML IV SOLN
500.0000 [IU] | Freq: Once | INTRAVENOUS | Status: AC | PRN
  Administered 2019-04-20: 500 [IU]
  Filled 2019-04-20: qty 5

## 2019-04-20 MED ORDER — PALONOSETRON HCL INJECTION 0.25 MG/5ML
INTRAVENOUS | Status: AC
  Filled 2019-04-20: qty 5

## 2019-04-20 MED ORDER — SODIUM CHLORIDE 0.9 % IV SOLN
10.0000 mg/kg | Freq: Once | INTRAVENOUS | Status: AC
  Administered 2019-04-20: 640 mg via INTRAVENOUS
  Filled 2019-04-20: qty 64

## 2019-04-20 MED ORDER — SODIUM CHLORIDE 0.9% FLUSH
10.0000 mL | INTRAVENOUS | Status: DC | PRN
  Administered 2019-04-20: 10 mL
  Filled 2019-04-20: qty 10

## 2019-04-20 MED ORDER — ACETAMINOPHEN 325 MG PO TABS
650.0000 mg | ORAL_TABLET | Freq: Once | ORAL | Status: AC
  Administered 2019-04-20: 650 mg via ORAL

## 2019-04-20 NOTE — Progress Notes (Signed)
Reviewed labwork with Dr. Marin Olp including platelets 65000, WBC 2.9. Alk pho 222. BUN 22.  Dr. Marin Olp states ok to treat today.

## 2019-04-20 NOTE — Patient Instructions (Signed)
[facility] Discharge Instructions for Patients Receiving Chemotherapy  Today you received the following chemotherapy agents Sophia [name]  To help prevent nausea and vomiting after your treatment, we encourage you to take your nausea medication    If you develop nausea and vomiting that is not controlled by your nausea medication, call the clinic.   BELOW ARE SYMPTOMS THAT SHOULD BE REPORTED IMMEDIATELY:  *FEVER GREATER THAN 100.5 F  *CHILLS WITH OR WITHOUT FEVER  NAUSEA AND VOMITING THAT IS NOT CONTROLLED WITH YOUR NAUSEA MEDICATION  *UNUSUAL SHORTNESS OF BREATH  *UNUSUAL BRUISING OR BLEEDING  TENDERNESS IN MOUTH AND THROAT WITH OR WITHOUT PRESENCE OF ULCERS  *URINARY PROBLEMS  *BOWEL PROBLEMS  UNUSUAL RASH Items with * indicate a potential emergency and should be followed up as soon as possible.  Feel free to call the clinic should you have any questions or concerns. The clinic phone number is [phone].  Please show the [place] at check-in to the Emergency Department and triage nurse.

## 2019-04-20 NOTE — Patient Instructions (Signed)
Implanted Port Insertion, Care After This sheet gives you information about how to care for yourself after your procedure. Your health care provider may also give you more specific instructions. If you have problems or questions, contact your health care provider. What can I expect after the procedure? After the procedure, it is common to have:  Discomfort at the port insertion site.  Bruising on the skin over the port. This should improve over 3-4 days. Follow these instructions at home: Port care  After your port is placed, you will get a manufacturer's information card. The card has information about your port. Keep this card with you at all times.  Take care of the port as told by your health care provider. Ask your health care provider if you or a family member can get training for taking care of the port at home. A home health care nurse may also take care of the port.  Make sure to remember what type of port you have. Incision care      Follow instructions from your health care provider about how to take care of your port insertion site. Make sure you: ? Wash your hands with soap and water before and after you change your bandage (dressing). If soap and water are not available, use hand sanitizer. ? Change your dressing as told by your health care provider. ? Leave stitches (sutures), skin glue, or adhesive strips in place. These skin closures may need to stay in place for 2 weeks or longer. If adhesive strip edges start to loosen and curl up, you may trim the loose edges. Do not remove adhesive strips completely unless your health care provider tells you to do that.  Check your port insertion site every day for signs of infection. Check for: ? Redness, swelling, or pain. ? Fluid or blood. ? Warmth. ? Pus or a bad smell. Activity  Return to your normal activities as told by your health care provider. Ask your health care provider what activities are safe for you.  Do not  lift anything that is heavier than 10 lb (4.5 kg), or the limit that you are told, until your health care provider says that it is safe. General instructions  Take over-the-counter and prescription medicines only as told by your health care provider.  Do not take baths, swim, or use a hot tub until your health care provider approves. Ask your health care provider if you may take showers. You may only be allowed to take sponge baths.  Do not drive for 24 hours if you were given a sedative during your procedure.  Wear a medical alert bracelet in case of an emergency. This will tell any health care providers that you have a port.  Keep all follow-up visits as told by your health care provider. This is important. Contact a health care provider if:  You cannot flush your port with saline as directed, or you cannot draw blood from the port.  You have a fever or chills.  You have redness, swelling, or pain around your port insertion site.  You have fluid or blood coming from your port insertion site.  Your port insertion site feels warm to the touch.  You have pus or a bad smell coming from the port insertion site. Get help right away if:  You have chest pain or shortness of breath.  You have bleeding from your port that you cannot control. Summary  Take care of the port as told by your health   care provider. Keep the manufacturer's information card with you at all times.  Change your dressing as told by your health care provider.  Contact a health care provider if you have a fever or chills or if you have redness, swelling, or pain around your port insertion site.  Keep all follow-up visits as told by your health care provider. This information is not intended to replace advice given to you by your health care provider. Make sure you discuss any questions you have with your health care provider. Document Released: 03/02/2013 Document Revised: 12/08/2017 Document Reviewed: 12/08/2017  Elsevier Patient Education  2020 [employer]vier Inc.  

## 2019-04-20 NOTE — Progress Notes (Signed)
Patient refused to stay for the 30 minute observation period post chemotherapy

## 2019-04-25 ENCOUNTER — Telehealth: Payer: Self-pay | Admitting: *Deleted

## 2019-04-26 NOTE — Telephone Encounter (Signed)
Received notification from patients mom Sophia [name] that [name] passed away this morning at 615a.  Dr. Marin Olp notified.  Flowers sent.

## 2019-04-26 DEATH — deceased

## 2019-04-27 ENCOUNTER — Other Ambulatory Visit: Payer: Medicaid Other

## 2019-04-27 ENCOUNTER — Ambulatory Visit: Payer: Medicaid Other

## 2019-04-27 ENCOUNTER — Ambulatory Visit: Payer: Medicaid Other | Admitting: Hematology & Oncology

## 2019-04-29 ENCOUNTER — Inpatient Hospital Stay: Payer: Medicaid Other

## 2019-05-04 ENCOUNTER — Ambulatory Visit: Payer: Medicaid Other

## 2019-05-04 ENCOUNTER — Ambulatory Visit: Payer: Medicaid Other | Admitting: Hematology & Oncology

## 2019-05-04 ENCOUNTER — Other Ambulatory Visit: Payer: Medicaid Other

## 2019-05-11 ENCOUNTER — Ambulatory Visit: Payer: Medicaid Other | Admitting: Hematology & Oncology

## 2019-05-11 ENCOUNTER — Ambulatory Visit: Payer: Medicaid Other

## 2019-05-11 ENCOUNTER — Other Ambulatory Visit: Payer: Medicaid Other

## 2019-05-12 ENCOUNTER — Other Ambulatory Visit: Payer: PRIVATE HEALTH INSURANCE

## 2019-05-12 ENCOUNTER — Ambulatory Visit: Payer: PRIVATE HEALTH INSURANCE | Admitting: Hematology & Oncology

## 2019-05-12 ENCOUNTER — Ambulatory Visit: Payer: PRIVATE HEALTH INSURANCE

## 2019-05-24 ENCOUNTER — Other Ambulatory Visit: Payer: Medicaid Other

## 2019-05-24 ENCOUNTER — Ambulatory Visit: Payer: Medicaid Other

## 2019-05-24 ENCOUNTER — Ambulatory Visit: Payer: Medicaid Other | Admitting: Hematology & Oncology

## 2019-05-25 ENCOUNTER — Ambulatory Visit: Payer: Medicaid Other

## 2019-05-25 ENCOUNTER — Other Ambulatory Visit: Payer: Medicaid Other

## 2019-05-25 ENCOUNTER — Ambulatory Visit: Payer: Medicaid Other | Admitting: Hematology & Oncology

## 2019-05-26 IMAGING — CT NUCLEAR MEDICINE PET IMAGE RESTAGING (PS) SKULL BASE TO THIGH
1 of 8 series · 3 of 16 positions shown, 4 images · non-contrast
Comparison: 05/31/2018

CLINICAL DATA: Subsequent treatment strategy for metastatic breast
cancer.

EXAM:
NUCLEAR MEDICINE PET SKULL BASE TO THIGH
TECHNIQUE: 9.0 mCi F-18 FDG was injected intravenously. Full-ring PET imaging
was performed from the skull base to thigh after the radiotracer. CT
data was obtained and used for attenuation correction and anatomic
localization.
Fasting blood glucose: 80 mg/dl

[Series 4: ct sk_thigh 5.0 b31f · axial · 0.98mm/px · z∈[-795,+153]mm · 3 of 238 slices shown, 4 images]
[im 1/238  soft-tissue]
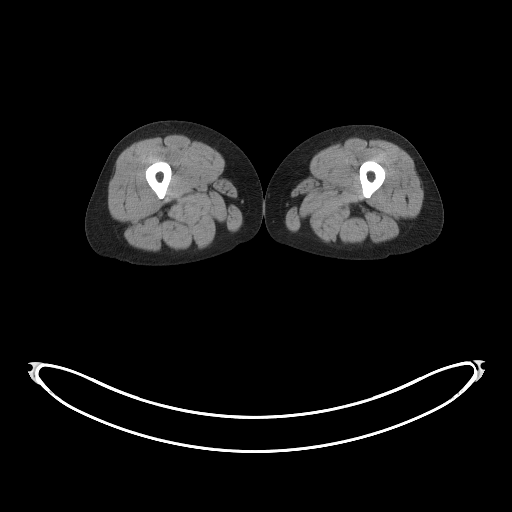
[im 1/238  bone]
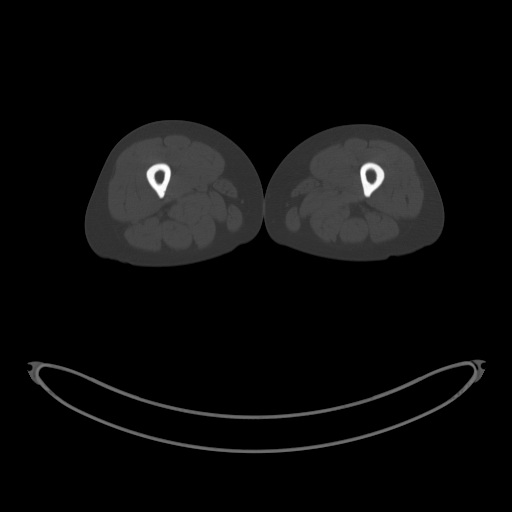
[im 119/238  soft-tissue]
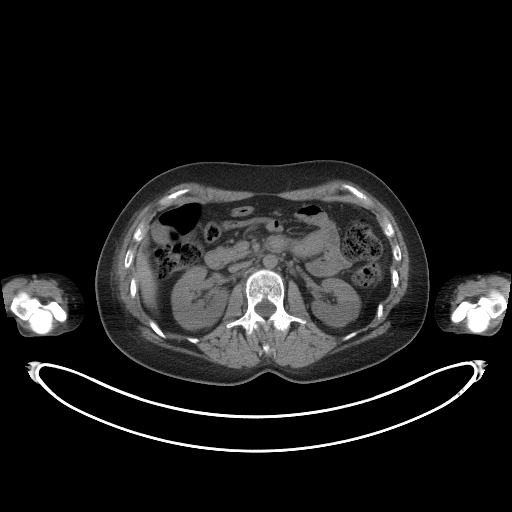
[im 238/238  soft-tissue]
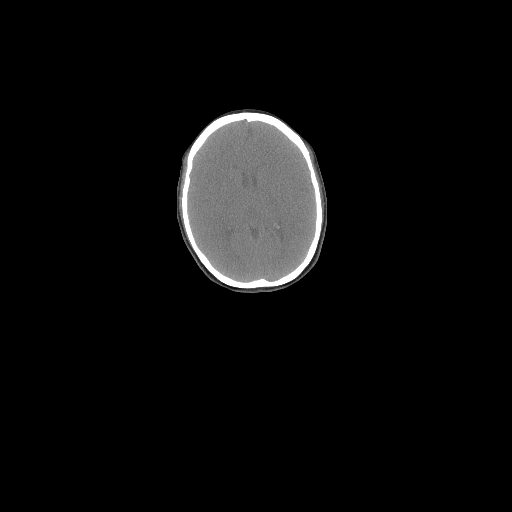

[3 of 16 positions shown; findings below may reference images not displayed]

FINDINGS: Mediastinal blood pool activity: SUV max

NECK: Symmetric uptake again noted in the tonsillar regions. New
small hypermetabolic focus identified in the left level II cervical
chain with SUV max = 5.2. No underlying lymph node can be identified
on CT, but features concerning for metastatic disease.

Incidental CT findings: none

CHEST: Hypermetabolic lymphadenopathy in the thorax persist.

2.3 cm index right medial subpectoral lesion measured previously is
now 1.5 cm (63/4). SUV max = 3.7 today compared to 15.3 previously.

Index lateral right subpectoral lesion measured previously at 2.4 cm
is now 1.6 cm (61/4) with SUV max = 6.1 compared to 18.6 previously.

Index right internal mammary node measured previously at 1.7 cm is
now 1.0 cm. SUV max = 5.8 today compared to 9.8 previously.

Index left internal mammary node has decreased SUV max =
compared to 6.4 previously.

3.3 cm anterior right chest wall lesion, deep to the breast
prosthesis (86/4) measured 3.9 cm previously. SUV max = 15.3 today
compared to 15.6 previously.

Incidental CT findings: Right Port-A-Cath tip is positioned in the
upper right atrium.

ABDOMEN/PELVIS: No abnormal hypermetabolic activity within the
liver, pancreas, adrenal glands, or spleen. No hypermetabolic lymph
nodes in the abdomen or pelvis.

Incidental CT findings: none

SKELETON: As before, multiple bone metastases are identified.

18 mm lytic lesion in the left aspect of the T7 vertebral body
(74/4) was sclerotic and measured only 8 mm previously. SUV max =
9.1 today compared to 6.3 previously.

Hypermetabolic lesion medial right eighth rib demonstrates SUV max =
7.1. This is new since prior. No underlying correlate can be
identified on CT scan.

Previously characterized right eighth rib lesion demonstrates SUV
max = 13 today compared to 9.2 previously.

Right iliac lesion measured previously with SUV max = 16.7 shows no
hypermetabolism above background marrow activity on today's study.

Left L3 lesion has increased in size, measuring 3.5 cm today
compared to 1.5 cm previously. Hypermetabolism in this lesion has
decreased with SUV max = 6.4 today compared to 16.5 previously.

Left sacral lesion (161/4) measures 2.3 cm today compared to 1.5 cm
previously. This lesion is become more hypermetabolic in the
interval with SUV max = 17 today compared to 11.3 previously.

Left inferior pubic ramus fracture shows hypermetabolic progression
with SUV max = 15.5 today compared 8.1 previously. Nondisplaced
pathologic fracture noted in this lesion today.

Incidental CT findings: Status post vertebral augmentation at T11
and T12.
IMPRESSION: 1. Stable to slight decrease in size and hypermetabolism of the soft
tissue lesions of the thorax. No definite new soft tissue disease
identified in the chest today.
2. Mixed response to therapy of osseous metastatic disease. The
index right iliac lesion shows marked decrease in hypermetabolic FDG
accumulation and the previously characterized lesion at L3, although
substantially increased in size, shows decreased hypermetabolic
activity. The remaining/majority of bony metastatic disease shows a
generalized trend towards progressive hypermetabolism.
3. No new soft tissue metastases in the abdomen or pelvis on today's
exam.

## 2019-06-01 ENCOUNTER — Ambulatory Visit: Payer: Medicaid Other

## 2019-06-01 ENCOUNTER — Other Ambulatory Visit: Payer: Medicaid Other

## 2019-06-01 ENCOUNTER — Ambulatory Visit: Payer: Medicaid Other | Admitting: Hematology & Oncology

## 2019-12-12 IMAGING — US US BIOPSY CORE LIVER
1 series · 11 of 11 positions shown · non-contrast
Comparison: none

INDICATION: 28-year-old female with a history of metastatic breast cancer

[Series 1: us biopsy core liver · 11 of 11 slices shown]
[im 1/11]
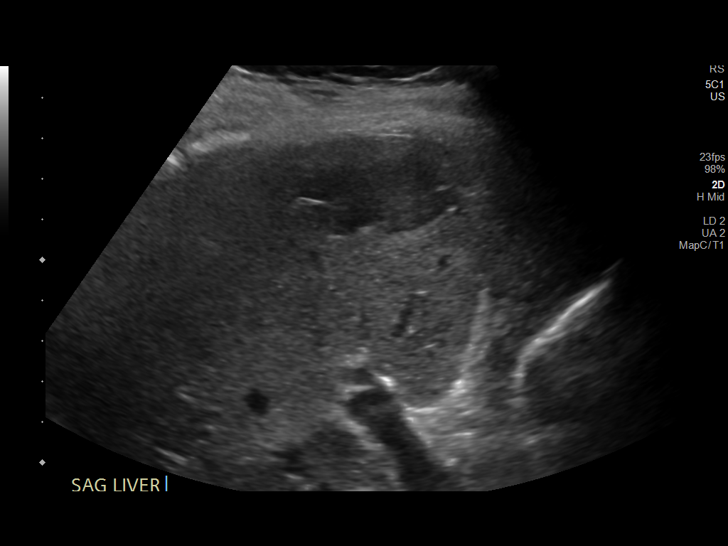
[im 2/11]
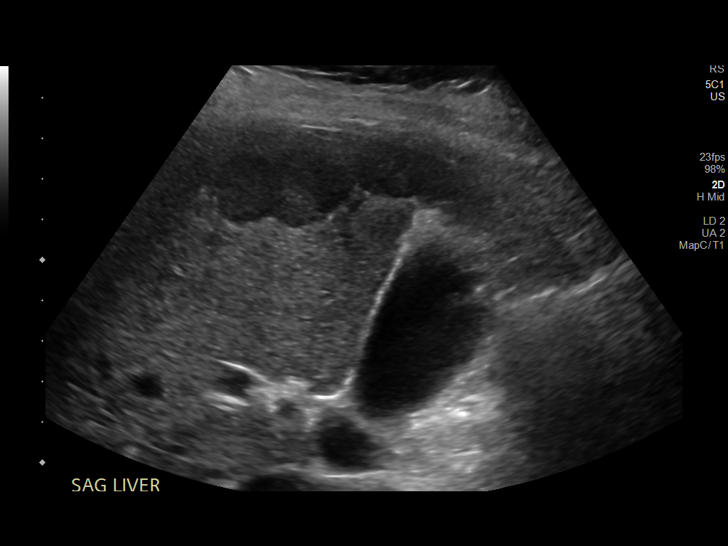
[im 3/11]
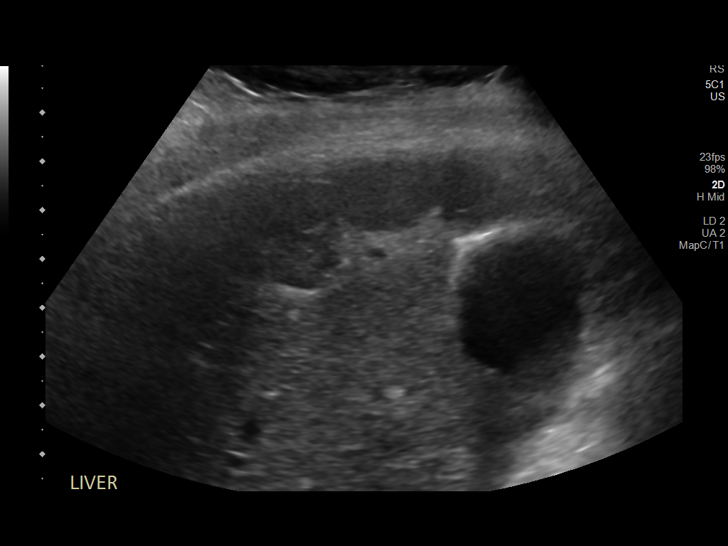
[im 4/11]
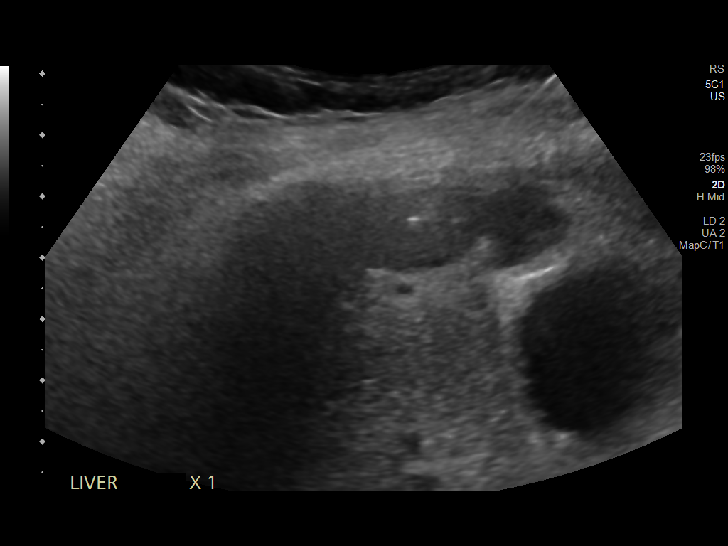
[im 5/11]
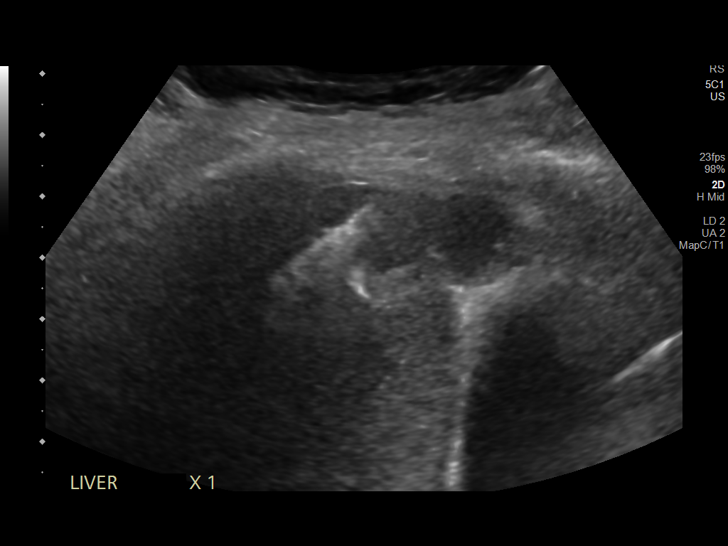
[im 6/11]
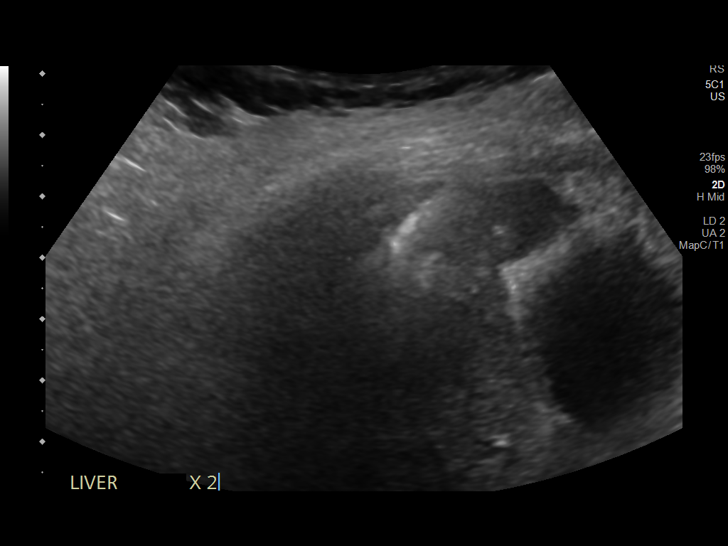
[im 7/11]
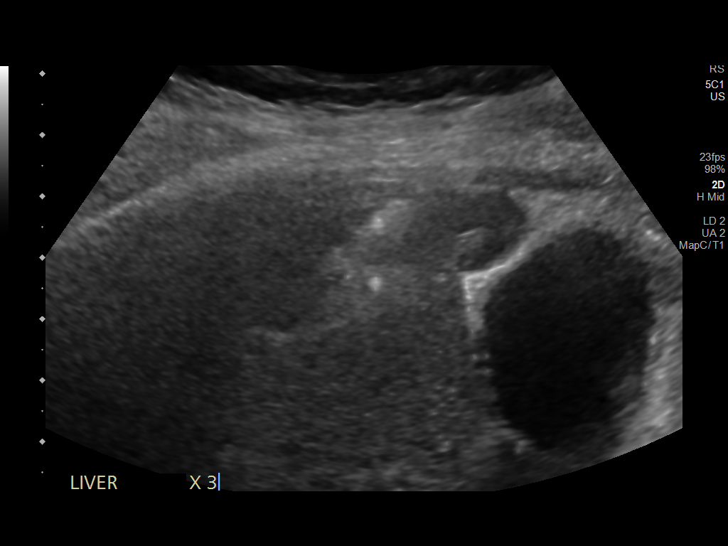
[im 8/11]
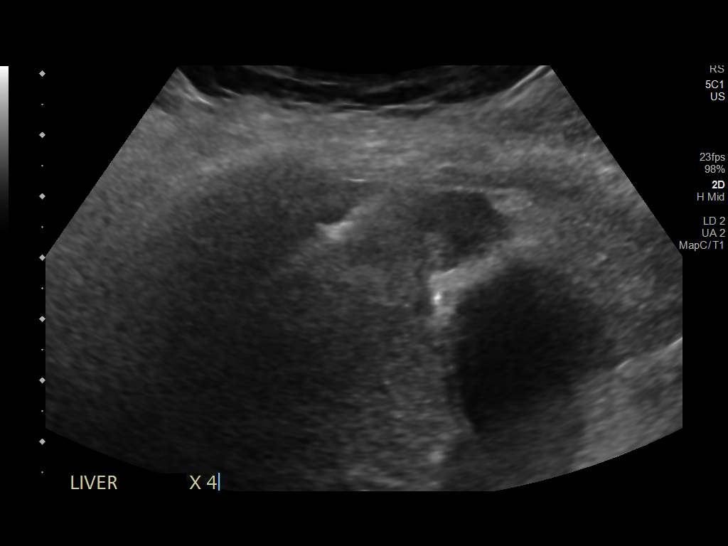
[im 9/11]
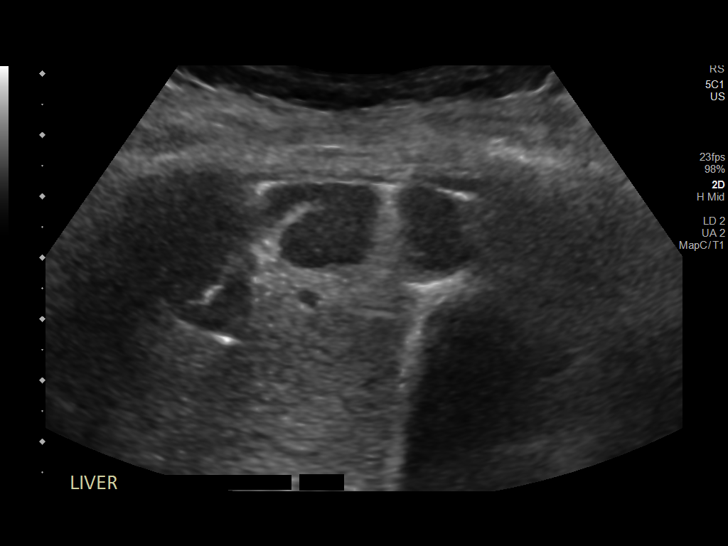
[im 10/11]
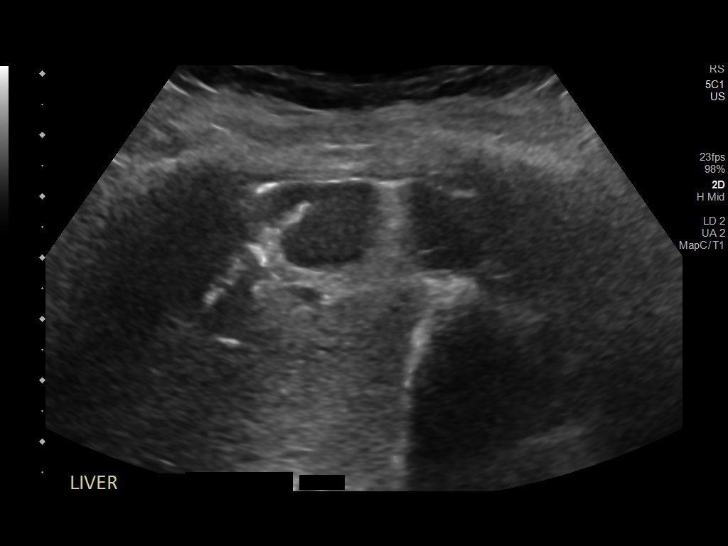
[im 11/11]
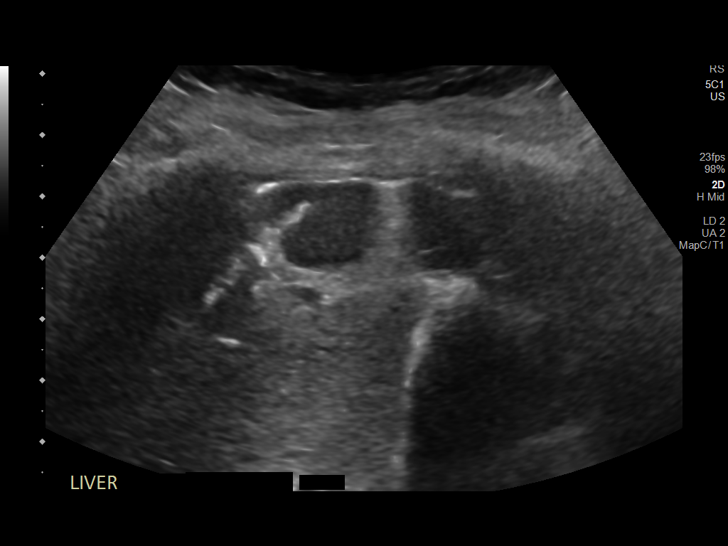

[11 of 11 positions shown; findings below may reference images not displayed]

EXAM:
ULTRASOUND-GUIDED BIOPSY OF LIVER MASS

MEDICATIONS:
None.

ANESTHESIA/SEDATION:
Moderate (conscious) sedation was employed during this procedure. A
total of Versed 1.0 mg and Fentanyl 50 mcg was administered
intravenously.

Moderate Sedation Time: 15 minutes. The patient's level of
consciousness and vital signs were monitored continuously by
radiology nursing throughout the procedure under my direct
supervision.

FLUOROSCOPY TIME:  None

COMPLICATIONS:
None

PROCEDURE:
Informed written consent was obtained from the patient after a
thorough discussion of the procedural risks, benefits and
alternatives. All questions were addressed. Maximal Sterile Barrier
Technique was utilized including caps, mask, sterile gowns, sterile
gloves, sterile drape, hand hygiene and skin antiseptic. A timeout
was performed prior to the initiation of the procedure.

Patient positioned supine position on the ultrasound stretcher.
Images of the right upper quadrant performed with images stored sent
to PACs.

The patient is prepped and draped in the usual sterile fashion. 1%
lidocaine was used for local anesthesia.

Using ultrasound guidance, guide needle was advanced into the lesion
of the right liver. Once we confirmed needle tip position multiple
18 gauge core biopsy were acquired. Gel-Foam pledgets were
administered.

Patient tolerated the procedure well and remained hemodynamically
stable throughout.

No complications were encountered and no significant blood loss.
IMPRESSION: Status post ultrasound-guided biopsy of right liver lesion.

## 2020-07-11 NOTE — Telephone Encounter (Signed)
na
